# Patient Record
Sex: Female | Born: 1995 | Race: White | Hispanic: No | Marital: Married | Smoking: Never smoker
Health system: Southern US, Community
[De-identification: ages and names within clinical notes are randomized; demographics above are authoritative.]

## PROBLEM LIST (undated history)

## (undated) ENCOUNTER — Inpatient Hospital Stay (HOSPITAL_COMMUNITY): Payer: Self-pay

## (undated) DIAGNOSIS — N390 Urinary tract infection, site not specified: Secondary | ICD-10-CM

## (undated) DIAGNOSIS — G43909 Migraine, unspecified, not intractable, without status migrainosus: Secondary | ICD-10-CM

## (undated) DIAGNOSIS — F419 Anxiety disorder, unspecified: Secondary | ICD-10-CM

## (undated) DIAGNOSIS — O24419 Gestational diabetes mellitus in pregnancy, unspecified control: Secondary | ICD-10-CM

## (undated) DIAGNOSIS — Z87442 Personal history of urinary calculi: Secondary | ICD-10-CM

## (undated) DIAGNOSIS — N809 Endometriosis, unspecified: Secondary | ICD-10-CM

## (undated) DIAGNOSIS — F32A Depression, unspecified: Secondary | ICD-10-CM

## (undated) DIAGNOSIS — A281 Cat-scratch disease: Secondary | ICD-10-CM

## (undated) DIAGNOSIS — F329 Major depressive disorder, single episode, unspecified: Secondary | ICD-10-CM

## (undated) DIAGNOSIS — N83209 Unspecified ovarian cyst, unspecified side: Secondary | ICD-10-CM

## (undated) DIAGNOSIS — T7840XA Allergy, unspecified, initial encounter: Secondary | ICD-10-CM

## (undated) DIAGNOSIS — R011 Cardiac murmur, unspecified: Secondary | ICD-10-CM

## (undated) DIAGNOSIS — R319 Hematuria, unspecified: Secondary | ICD-10-CM

## (undated) DIAGNOSIS — R768 Other specified abnormal immunological findings in serum: Secondary | ICD-10-CM

## (undated) DIAGNOSIS — K219 Gastro-esophageal reflux disease without esophagitis: Secondary | ICD-10-CM

## (undated) HISTORY — DX: Other specified abnormal immunological findings in serum: R76.8

## (undated) HISTORY — PX: WISDOM TOOTH EXTRACTION: SHX21

## (undated) HISTORY — DX: Unspecified ovarian cyst, unspecified side: N83.209

## (undated) HISTORY — DX: Endometriosis, unspecified: N80.9

## (undated) HISTORY — DX: Hematuria, unspecified: R31.9

## (undated) HISTORY — DX: Allergy, unspecified, initial encounter: T78.40XA

## (undated) HISTORY — DX: Gestational diabetes mellitus in pregnancy, unspecified control: O24.419

## (undated) HISTORY — PX: OTHER SURGICAL HISTORY: SHX169

---
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
## 2014-01-04 ENCOUNTER — Encounter: Payer: Self-pay | Admitting: Gastroenterology

## 2014-01-04 ENCOUNTER — Ambulatory Visit (INDEPENDENT_AMBULATORY_CARE_PROVIDER_SITE_OTHER): Payer: Medicaid Other | Admitting: Gastroenterology

## 2014-01-04 VITALS — BP 106/74 | HR 84 | Temp 97.1°F | Ht 59.0 in | Wt 182.0 lb

## 2014-01-04 DIAGNOSIS — R109 Unspecified abdominal pain: Secondary | ICD-10-CM

## 2014-01-04 NOTE — Progress Notes (Signed)
Referring Provider: Tylene FantasiaMuse, Rochelle D., PA-C Primary GI: Dr. Darrick PennaFields   Chief Complaint  Patient presents with  . Chronic Abdominal Pain    HPI:   Returns today in follow-up for chronic abdominal pain, with extensive work-up to include CT, pelvic ultrasounds, labs, and most recently EGD with gastritis but negative H.pylori. US of abdomen without gallstones. HIDA with mildly reduced EF at 23%. Referred to Dr. Lovell SheehanJenkins for consideration of elective cholecystectomy. Still trying to decide if this is what she wants to do. Saw a urologist today due to urinary incontinence. Ordered MRI of spine. Notes periumbilical discomfort, "all the time". Worsened with food intake. Soda, water, causes more discomfort. Found out she was Vit D deficient. Now taking topamax. Feels like she is having worsening of "tingling" in her legs with this. Sees neurologist. Protonix works really well. Up 5 lbs from July 2015.   [facility]. Prescribed a medication that made her suicidal and stopped. [facility]    Past Medical History  Diagnosis Date  . Cat scratch fever   . Heart murmur of newborn   . UTI (lower urinary tract infection)   . Kidney stones   . Helicobacter pylori ab+     H.pylori serology positive in Jan 2015, treated with Amoxicillin and Flagyl at that time. Returned to clinic Feb 2015 and had rechecked, with a different provider treating with Amoxicillin and Biaxin. H.pylori stool antigen ordered April 2015 and negative.  . Ovarian cyst   . Other and unspecified ovarian cyst 08/09/2013  . Hematuria 08/09/2013  . Migraines     Past Surgical History  Procedure Laterality Date  . None    . Esophagogastroduodenoscopy N/A 08/13/2013    Dr. Darrick PennaFields: nodular gastritis, negative H.pylori    Current Outpatient Prescriptions  Medication Sig Dispense Refill  . pantoprazole (PROTONIX) 40 MG tablet Take 1 tablet (40 mg total) by mouth daily. Take 30  minutes before breakfast 30 tablet 3  . topiramate (TOPAMAX) 25 MG tablet Take 25 mg by mouth 2 (two) times daily.    . Vitamin D, Ergocalciferol, (DRISDOL) 50000 UNITS CAPS capsule Take 50,000 Units by mouth every 7 (seven) days.     No current facility-administered medications for this visit.    Allergies as of 01/04/2014  . (No Known Allergies)    Family History  Problem Relation Age of Onset  . Colon cancer      unknown  . Gallbladder disease Mother     gallbladder was removed  . Migraines Mother   . Migraines Sister   . Hypertension Sister   . ADD / ADHD Brother   . Hypertension Maternal Grandmother   . Cancer Maternal Grandfather     lung  . Cancer Paternal Grandfather     lung  . Seizures Sister   . Scoliosis Sister   . ADD / ADHD Brother     History   Social History  . Marital Status: Single    Spouse Name: N/A    Number of Children: N/A  . Years of Education: N/A   Social History Main Topics  . Smoking status: Never Smoker   . Smokeless tobacco: Never Used  . Alcohol Use: No  . Drug Use: No  . Sexual Activity: Not Currently    Birth Control/ Protection: None   Other Topics Concern  . None   Social History Narrative    Review of Systems: As mentioned in HPI  Physical Exam: BP 106/74 mmHg  Pulse 84  Temp(Src) 97.1 F (36.2 C) (Oral)  Ht 4\' 11"  (1.499 m)  Wt 182 lb (82.555 kg)  BMI 36.74 kg/m2 General:   Alert and oriented. No distress noted. Pleasant and cooperative.  Head:  Normocephalic and atraumatic. Eyes:  Conjuctiva clear without scleral icterus. Mouth:  Oral mucosa pink and moist. Good dentition. No lesions. Abdomen:  +BS, soft, TTP diffusely and non-distended. No rebound or guarding. No HSM or masses noted. Msk:  Symmetrical without gross deformities. Normal posture. Extremities:  Without edema. Neurologic:  Alert and  oriented x4;  grossly normal neurologically. Skin:  Intact without significant lesions or rashes. Psych:  Alert  and cooperative. Normal mood and affect.

## 2014-01-04 NOTE — Patient Instructions (Signed)
Continue taking Protonix once daily.   Continue appointments with other specialists.   We will have you return in 6 months.

## 2014-01-10 DIAGNOSIS — R109 Unspecified abdominal pain: Secondary | ICD-10-CM | POA: Insufficient documentation

## 2014-01-10 NOTE — Assessment & Plan Note (Signed)
18 year old female with chronic abdominal pain with exhaustive evaluation as noted in HPI; holding on elective cholecystectomy currently but has seen Dr. Lovell SheehanJenkins. Now with several non-GI issues as noted above; recommend continued Protonix daily, [facility]. No concerning signs, and she continues to gain weight, up 5 lbs since July 2015. Return in 6 months.

## 2014-01-13 ENCOUNTER — Other Ambulatory Visit: Payer: Self-pay

## 2014-01-13 NOTE — Progress Notes (Signed)
cc'ed to pcp °

## 2014-01-14 MED ORDER — PANTOPRAZOLE SODIUM 40 MG PO TBEC: 40.0000 mg | DELAYED_RELEASE_TABLET | Freq: Every day | ORAL | Status: DC

## 2014-04-07 DIAGNOSIS — Z01419 Encounter for gynecological examination (general) (routine) without abnormal findings: Secondary | ICD-10-CM | POA: Insufficient documentation

## 2014-05-05 DIAGNOSIS — E559 Vitamin D deficiency, unspecified: Secondary | ICD-10-CM | POA: Insufficient documentation

## 2014-06-22 ENCOUNTER — Encounter: Payer: Self-pay | Admitting: Gastroenterology

## 2014-07-22 ENCOUNTER — Ambulatory Visit (INDEPENDENT_AMBULATORY_CARE_PROVIDER_SITE_OTHER): Payer: Medicaid Other | Admitting: Gastroenterology

## 2014-07-22 ENCOUNTER — Encounter: Payer: Self-pay | Admitting: Gastroenterology

## 2014-07-22 VITALS — BP 109/78 | HR 77 | Temp 98.1°F | Ht 59.0 in | Wt 175.2 lb

## 2014-07-22 DIAGNOSIS — R109 Unspecified abdominal pain: Secondary | ICD-10-CM

## 2014-07-22 MED ORDER — HYOSCYAMINE SULFATE 0.125 MG SL SUBL: 0.1250 mg | SUBLINGUAL_TABLET | SUBLINGUAL | Status: DC | PRN

## 2014-07-22 NOTE — Patient Instructions (Signed)
I sent Levsin to your pharmacy to take every 4 hours only AS NEEDED for loose stool and belly cramping.   I would like to see you back in August before you start the fall semester.  Good luck with school!

## 2014-07-22 NOTE — Progress Notes (Signed)
Referring Provider: Rebecka ApleyHemberg, Katherine V, RN Primary Care Physician:  Rebecka ApleyHEMBERG, KATHERINE V, RN  Primary GI: Dr. Darrick PennaFields   Chief Complaint  Patient presents with  . Abdominal Pain    no better    HPI:   Courtney FleetCassandra M Spencer is a 19 y.o. female presenting today with a history of chronic abdominal pain, with extensive work-up to include CT, pelvic ultrasounds, labs, and most recently EGD with gastritis but negative H.pylori. US of abdomen without gallstones. HIDA with mildly reduced EF at 23%. Referred to Dr. Lovell SheehanJenkins for consideration of elective cholecystectomy. Still trying to decide if this is what she wants to do.   Just goes as needed to counselor. Things are better. Down a few lbs since last seen. Doing protein shakes, exercising more. Had 3 stools this morning before coming here. Sometimes loose, sometimes normal. Tends to be slightly more on the diarrhea side, about 50-55% of the time. Cut out dark sodas. Limiting greasy foods. No probiotic.   Past Medical History  Diagnosis Date  . Cat scratch fever   . Heart murmur of newborn   . UTI (lower urinary tract infection)   . Kidney stones   . Helicobacter pylori ab+     H.pylori serology positive in Jan 2015, treated with Amoxicillin and Flagyl at that time. Returned to clinic Feb 2015 and had rechecked, with a different provider treating with Amoxicillin and Biaxin. H.pylori stool antigen ordered April 2015 and negative.  . Ovarian cyst   . Other and unspecified ovarian cyst 08/09/2013  . Hematuria 08/09/2013  . Migraines     Past Surgical History  Procedure Laterality Date  . None    . Esophagogastroduodenoscopy N/A 08/13/2013    Dr. Darrick PennaFields: nodular gastritis, negative H.pylori    Current Outpatient Prescriptions  Medication Sig Dispense Refill  . pantoprazole (PROTONIX) 40 MG tablet Take 1 tablet (40 mg total) by mouth daily. Take 30 minutes before breakfast 30 tablet 11  . topiramate (TOPAMAX) 25 MG tablet Take 25 mg by  mouth 2 (two) times daily.    . Vitamin D, Ergocalciferol, (DRISDOL) 50000 UNITS CAPS capsule Take 50,000 Units by mouth every 7 (seven) days.     No current facility-administered medications for this visit.    Allergies as of 07/22/2014  . (No Known Allergies)    Family History  Problem Relation Age of Onset  . Colon cancer      unknown  . Gallbladder disease Mother     gallbladder was removed  . Migraines Mother   . Migraines Sister   . Hypertension Sister   . ADD / ADHD Brother   . Hypertension Maternal Grandmother   . Cancer Maternal Grandfather     lung  . Cancer Paternal Grandfather     lung  . Seizures Sister   . Scoliosis Sister   . ADD / ADHD Brother     History   Social History  . Marital Status: Single    Spouse Name: N/A  . Number of Children: N/A  . Years of Education: N/A   Occupational History  . Automotive engineerstudent     [employer]   Social History Main Topics  . Smoking status: Never Smoker   . Smokeless tobacco: Never Used     Comment: Never really smoked  . Alcohol Use: No  . Drug Use: No  . Sexual Activity: Not Currently    Birth Control/ Protection: None   Other Topics Concern  . None  Social History Narrative    Review of Systems: As mentioned in HPI  Physical Exam: BP 109/78 mmHg  Pulse 77  Temp(Src) 98.1 F (36.7 C) (Oral)  Ht  (1.499 m)  Wt 175 lb 3.2 oz (79.47 kg)  BMI 35.37 kg/m2  LMP 06/10/2014 (Approximate) General:   Alert and oriented. No distress noted. Pleasant and cooperative.  Head:  Normocephalic and atraumatic. Eyes:  Conjuctiva clear without scleral icterus. Mouth:  Oral mucosa pink and moist. Good dentition. No lesions. Abdomen:  +BS, soft, non-tender and non-distended. No rebound or guarding. No HSM or masses noted. Msk:  Symmetrical without gross deformities. Normal posture. Extremities:  Without edema. Neurologic:  Alert and  oriented x4;  grossly normal neurologically. Psych:  Alert and  cooperative. Normal mood and affect.

## 2014-08-01 NOTE — Assessment & Plan Note (Signed)
19 year old female with chronic abdmoinal pain with extensive work-up, at baseline. Improvement in overall symptoms since last seen and doing better from a psychosocial standpoint. Notes intermittent loose stool, about 50-55% of the time. Not a candidate for Viberzi due to pattern of stools but will trial Levsin to just take as needed. Return in August for further assessment. Encouraged patient to continue weight loss efforts as she has been doing.

## 2014-08-08 NOTE — Progress Notes (Signed)
cc'ed to pcp °

## 2014-09-26 ENCOUNTER — Encounter: Payer: Self-pay | Admitting: Gastroenterology

## 2014-09-26 ENCOUNTER — Ambulatory Visit (INDEPENDENT_AMBULATORY_CARE_PROVIDER_SITE_OTHER): Payer: Medicaid Other | Admitting: Gastroenterology

## 2014-09-26 VITALS — BP 108/76 | HR 79 | Temp 98.6°F | Ht 59.0 in | Wt 184.4 lb

## 2014-09-26 DIAGNOSIS — R109 Unspecified abdominal pain: Secondary | ICD-10-CM | POA: Diagnosis not present

## 2014-09-26 NOTE — Patient Instructions (Addendum)
Decrease the Levsin by cutting in 1/2 and take only as needed.  Drink at least 8 full glasses of water a day. Follow a high fiber diet that I've attached.  Good luck with school! I will see you in 6 weeks. High-Fiber Diet Fiber is found in fruits, vegetables, and grains. A high-fiber diet encourages the addition of more whole grains, legumes, fruits, and vegetables in your diet. The recommended amount of fiber for adult males is 38 g per day. For adult females, it is 25 g per day. Pregnant and lactating women should get 28 g of fiber per day. If you have a digestive or bowel problem, ask your caregiver for advice before adding high-fiber foods to your diet. Eat a variety of high-fiber foods instead of only a select few type of foods.  PURPOSE  To increase stool bulk.  To make bowel movements more regular to prevent constipation.  To lower cholesterol.  To prevent overeating. WHEN IS THIS DIET USED?  It may be used if you have constipation and hemorrhoids.  It may be used if you have uncomplicated diverticulosis (intestine condition) and irritable bowel syndrome.  It may be used if you need help with weight management.  It may be used if you want to add it to your diet as a protective measure against atherosclerosis, diabetes, and cancer. SOURCES OF FIBER  Whole-grain breads and cereals.  Fruits, such as apples, oranges, bananas, berries, prunes, and pears.  Vegetables, such as green peas, carrots, sweet potatoes, beets, broccoli, cabbage, spinach, and artichokes.  Legumes, such split peas, soy, lentils.  Almonds. FIBER CONTENT IN FOODS Starches and Grains / Dietary Fiber (g)  Cheerios, 1 cup / 3 g  Corn Flakes cereal, 1 cup / 0.7 g  Rice crispy treat cereal, 1 cup / 0.3 g  Instant oatmeal (cooked),  cup / 2 g  Frosted wheat cereal, 1 cup / 5.1 g  Brown, long-grain rice (cooked), 1 cup / 3.5 g  White, long-grain rice (cooked), 1 cup / 0.6 g  Enriched macaroni  (cooked), 1 cup / 2.5 g Legumes / Dietary Fiber (g)  Baked beans (canned, plain, or vegetarian),  cup / 5.2 g  Kidney beans (canned),  cup / 6.8 g  Pinto beans (cooked),  cup / 5.5 g Breads and Crackers / Dietary Fiber (g)  Plain or honey graham crackers, 2 squares / 0.7 g  Saltine crackers, 3 squares / 0.3 g  Plain, salted pretzels, 10 pieces / 1.8 g  Whole-wheat bread, 1 slice / 1.9 g  White bread, 1 slice / 0.7 g  Raisin bread, 1 slice / 1.2 g  Plain bagel, 3 oz / 2 g  Flour tortilla, 1 oz / 0.9 g  Corn tortilla, 1 small / 1.5 g  Hamburger or hotdog bun, 1 small / 0.9 g Fruits / Dietary Fiber (g)  Apple with skin, 1 medium / 4.4 g  Sweetened applesauce,  cup / 1.5 g  Banana,  medium / 1.5 g  Grapes, 10 grapes / 0.4 g  Orange, 1 small / 2.3 g  Raisin, 1.5 oz / 1.6 g  Melon, 1 cup / 1.4 g Vegetables / Dietary Fiber (g)  Green beans (canned),  cup / 1.3 g  Carrots (cooked),  cup / 2.3 g  Broccoli (cooked),  cup / 2.8 g  Peas (cooked),  cup / 4.4 g  Mashed potatoes,  cup / 1.6 g  Lettuce, 1 cup / 0.5 g  Corn (canned),  cup / 1.6 g  Tomato,  cup / 1.1 g Document Released: 02/11/2005 Document Revised: 08/13/2011 Document Reviewed: 05/16/2011 [facility] Patient Information 2015 [place], [place]. This information is not intended to replace advice given to you by your health care provider. Make sure you discuss any questions you have with your health care provider.

## 2014-09-26 NOTE — Progress Notes (Signed)
Referring Provider: Rebecka Apley, RN Primary Care Physician:  Rebecka Apley, RN  Primary GI: Dr. Darrick Penna   Chief Complaint  Patient presents with  . Follow-up    HPI:   Courtney Spencer is a 19 y.o. female presenting today with a history of chronic abdominal pain, diarrhea, with extensive work-up to include CT, pelvic ultrasounds, labs, and  EGD with gastritis but negative H.pylori. [place] of abdomen without gallstones. HIDA with mildly reduced EF at 23%. Here for close follow-up.    Will take Levsin with some improvement of abdominal discomfort but then ends up slightly constipated a few days later. Lately going to the bathroom and has little balls. Drinking about 2 cups of water per day. Has done fiber in the past, which causes diarrhea. Has cut down on spicy foods. Sometimes goes four times in one hour in the morning. Has urgency to go to bathroom but not diarrhea.    Past Medical History  Diagnosis Date  . Cat scratch fever   . Heart murmur of newborn   . UTI (lower urinary tract infection)   . Kidney stones   . Helicobacter pylori ab+     H.pylori serology positive in Jan 2015, treated with Amoxicillin and Flagyl at that time. Returned to clinic Feb 2015 and had rechecked, with a different provider treating with Amoxicillin and Biaxin. H.pylori stool antigen ordered April 2015 and negative.  . Ovarian cyst   . Other and unspecified ovarian cyst 08/09/2013  . Hematuria 08/09/2013  . Migraines     Past Surgical History  Procedure Laterality Date  . None    . Esophagogastroduodenoscopy N/A 08/13/2013    Dr. Darrick Penna: nodular gastritis, negative H.pylori    Current Outpatient Prescriptions  Medication Sig Dispense Refill  . hyoscyamine (LEVSIN SL) 0.125 MG SL tablet Place 1 tablet (0.125 mg total) under the tongue every 4 (four) hours as needed. 60 tablet 3  . pantoprazole (PROTONIX) 40 MG tablet Take 1 tablet (40 mg total) by mouth daily. Take 30 minutes before  breakfast 30 tablet 11  . propranolol (INDERAL) 60 MG tablet Take 60 mg by mouth 3 (three) times daily.    . Vitamin D, Ergocalciferol, (DRISDOL) 50000 UNITS CAPS capsule Take 50,000 Units by mouth every 7 (seven) days.     No current facility-administered medications for this visit.    Allergies as of 09/26/2014  . (No Known Allergies)    Family History  Problem Relation Age of Onset  . Colon cancer      unknown  . Gallbladder disease Mother     gallbladder was removed  . Migraines Mother   . Migraines Sister   . Hypertension Sister   . ADD / ADHD Brother   . Hypertension Maternal Grandmother   . Cancer Maternal Grandfather     lung  . Cancer Paternal Grandfather     lung  . Seizures Sister   . Scoliosis Sister   . ADD / ADHD Brother     History   Social History  . Marital Status: Single    Spouse Name: N/A  . Number of Children: N/A  . Years of Education: N/A   Occupational History  . 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 Smoking status: Never Smoker   . Smokeless tobacco: Never Used     Comment: Never really  smoked  . Alcohol Use: No  . Drug Use: No  . Sexual Activity: Not Currently    Birth Control/ Protection: None   Other Topics Concern  . None   Social History Narrative    Review of Systems: As mentioned in HPI  Physical Exam: BP 108/76 mmHg  Pulse 79  Temp(Src) 98.6 F (37 C)  Ht 4\' 11"  (1.499 m)  Wt 184 lb 6.4 oz (83.643 kg)  BMI 37.22 kg/m2  LMP 09/05/2014 (Approximate) General:   Alert and oriented. No distress noted. Pleasant and cooperative.  Head:  Normocephalic and atraumatic. Eyes:  Conjuctiva clear without scleral icterus. Abdomen:  +BS, soft, non-tender and non-distended. No rebound or guarding. No HSM or masses noted. Msk:  Symmetrical without gross deformities. Normal posture. Extremities:  Without edema. Neurologic:  Alert and   oriented x4;  grossly normal neurologically. Psych:  Alert and cooperative. Normal mood and affect.

## 2014-09-28 NOTE — Assessment & Plan Note (Signed)
Improved on Levsin but now with tendency towards constipation. Unable to tolerate supplemental fiber. Likely diet/behavior playing a role in overall symptoms. Thorough work-up in the past for abdominal pain. Try novel approach of 1/2 dose Levsin as needed, increase dietary fiber through food choices, increase water intake, and return in 6 weeks.

## 2014-09-29 NOTE — Progress Notes (Signed)
cc'ed to pcp °

## 2014-10-24 ENCOUNTER — Encounter: Payer: Self-pay | Admitting: Women's Health

## 2014-10-24 ENCOUNTER — Ambulatory Visit (INDEPENDENT_AMBULATORY_CARE_PROVIDER_SITE_OTHER): Payer: Medicaid Other | Admitting: Women's Health

## 2014-10-24 VITALS — BP 106/62 | HR 76 | Wt 182.0 lb

## 2014-10-24 DIAGNOSIS — IMO0002 Reserved for concepts with insufficient information to code with codable children: Secondary | ICD-10-CM | POA: Insufficient documentation

## 2014-10-24 DIAGNOSIS — N941 Dyspareunia: Secondary | ICD-10-CM | POA: Diagnosis not present

## 2014-10-24 DIAGNOSIS — R102 Pelvic and perineal pain: Secondary | ICD-10-CM | POA: Diagnosis not present

## 2014-10-24 DIAGNOSIS — N949 Unspecified condition associated with female genital organs and menstrual cycle: Secondary | ICD-10-CM

## 2014-10-24 NOTE — Patient Instructions (Addendum)
Call the office to get pelvic ultrasound scheduled for when you will be going off of your period, and follow up visit with me after ultrasound

## 2014-10-24 NOTE — Progress Notes (Signed)
Patient ID: Courtney Spencer, female   DOB: 1995/05/10, 19 y.o.   MRN: [id number]  Chief Complaint  Patient presents with  . Abdominal Pain    states has cyst on ovaries that are hurting, pain with sex    Blood pressure 106/62, pulse 76, weight 182 lb (82.555 kg), last menstrual period 10/03/2014.  19 y.o. G0P0 Patient's last menstrual period was 10/03/2014. The current method of family planning is condoms.  Subjective Intermittent bilateral lower pelvic pain 'for awhile', also dyspareunia for about 1 month. Has h/o ovarian cysts, feels like the same kind of pain. Will take Levsin SL which sometimes helps w/ her pain. Reports that she takes Levsin for gallbladder that needs to come out but she's not sure if she wants it out yet. Denies abnormal d/c, itching/odor/irritation. Regular periods, regular flow. Condoms for contraception- doesn't like birth control. Depo made her gain 60+lbs, COCs made her stomach hurt.   Objective Vulva:  normal appearing vulva with no masses, tenderness or lesions Vagina:  normal mucosa, no discharge Cervix:  cervical motion tenderness and no lesions Uterus:  normal size, contour, position, consistency, mobility, non-tender Adnexa: ovaries:present,  tenderness right, no masses    Pertinent ROS No burning with urination, frequency or urgency No nausea, vomiting or diarrhea Nor fever chills or other constitutional symptoms  Labs or studies 08/30/13 Pelvic U/S:  Courtney Spencer is a 19 y.o. G0P0 LMP 07/29/2013 for a pelvic sonogram for follow up Rt ovarian cyst noted on previous u/s (3.2 x 2.8 x 3.2cm). Uterus 6.5 x 3.3 x 4.4 cm, anteverted uterus Endometrium 9.5 mm, symmetrical,  Right ovary 3.5 x 2.7 x 1.8 cm, no cyst or mass noted on today's exam, +Doppler flow noted within  Left ovary 2.4 x 1.8 x 1.6 cm, +Doppler flow noted within  No free fluid or adnexal masse noted within the pelvis Technician  Comments: Anteverted uterus, Endom symmetrical 9.35mm, bilateral adnexa/ovaries appear WNL, No free fluid or adnexal masses noted within the pelvis Chari Manning 08/30/2013 10:06 AM   Impression Diagnoses this Encounter::   ICD-9-CM ICD-10-CM   1. Dyspareunia 625.0 N94.1 GC/Chlamydia Probe Amp  2. Cervical motion tenderness 625.8 N94.9 GC/Chlamydia Probe Amp  3. Pelvic pain in female 625.9 R10.2 US Pelvis Complete     US Transvaginal Non-OB     GC/Chlamydia Probe Amp    Established relevant diagnosis(es): Chronic abd pain per GI w/ extensive work-up including CT, labs, EGD w/ gastritis but neg H pylori, [place] abd w/o gallstones, HIDA w/ mildly reduced EF @ 23%  Plan/Recommendations Discussed [facility] to suppress ovulation to help prevent ovarian cysts- pt declines, doesn't like contraception Will do pelvic u/s when she comes off of next period to evaluate ovaries Will get gc/ct d/t CMT  Orders Placed This Encounter  Procedures  . GC/Chlamydia Probe Amp  . US Pelvis Complete  . US Transvaginal Non-OB    Follow up Return for pt will call for appt. when comes off period, will need pelvic u/s and f/u w/ me   Cheral Marker, CNM, [facility] 10/24/2014 3:33 PM

## 2014-10-29 LAB — GC/CHLAMYDIA PROBE AMP
Chlamydia trachomatis, NAA: NEGATIVE
Neisseria gonorrhoeae by PCR: NEGATIVE

## 2014-11-15 ENCOUNTER — Ambulatory Visit: Payer: Medicaid Other | Admitting: Gastroenterology

## 2014-11-18 ENCOUNTER — Ambulatory Visit (INDEPENDENT_AMBULATORY_CARE_PROVIDER_SITE_OTHER): Payer: Medicaid Other

## 2014-11-18 DIAGNOSIS — R102 Pelvic and perineal pain: Secondary | ICD-10-CM | POA: Diagnosis not present

## 2014-11-18 NOTE — Progress Notes (Signed)
PELVIC TA/TV:normal anteverted uterus,EEC 6.30mm,normal ov's bilat (mobile),bilat adnexal pain during ultrasound,no free fluid seen

## 2014-11-23 ENCOUNTER — Ambulatory Visit: Payer: Medicaid Other | Admitting: Women's Health

## 2014-11-30 ENCOUNTER — Encounter: Payer: Self-pay | Admitting: Women's Health

## 2014-11-30 ENCOUNTER — Ambulatory Visit (INDEPENDENT_AMBULATORY_CARE_PROVIDER_SITE_OTHER): Payer: Medicaid Other | Admitting: Women's Health

## 2014-11-30 VITALS — BP 102/62 | HR 80 | Wt 187.0 lb

## 2014-11-30 DIAGNOSIS — R102 Pelvic and perineal pain: Secondary | ICD-10-CM

## 2014-11-30 NOTE — Progress Notes (Signed)
Patient ID: Courtney Spencer, female   DOB: [birth year], 19 y.o.   MRN: [id number]   [facility] Clinic Visit  Patient name: Courtney Spencer MRN [id number]  Date of birth: September 08, 1995  CC & HPI:  Courtney Spencer is a 19 y.o. G0P0 Caucasian female presenting today for f/u after pelvic u/s for intermittent bilateral  pelvic pain 'for awhile' w/ h/o ovarian cysts.  Reports she had pain when ultrasonographer was looking at ovaries. GC/CT from last visit was neg. Reports that Levsin she takes for gallbladder that needs to come out helps with pain. Has appt tomorrow w/ GI.  Patient's last menstrual period was 11/07/2014 (approximate).   Pertinent History Reviewed:  Medical & Surgical Hx:   Past Medical History  Diagnosis Date  . Cat scratch fever   . Heart murmur of newborn   . UTI (lower urinary tract infection)   . Kidney stones   . Helicobacter pylori ab+     H.pylori serology positive in Jan 2015, treated with Amoxicillin and Flagyl at that time. Returned to clinic Feb 2015 and had rechecked, with a different provider treating with Amoxicillin and Biaxin. H.pylori stool antigen ordered April 2015 and negative.  . Ovarian cyst   . Other and unspecified ovarian cyst 08/09/2013  . Hematuria 08/09/2013  . Migraines    Past Surgical History  Procedure Laterality Date  . None    . Esophagogastroduodenoscopy N/A 08/13/2013    Dr. Darrick Penna: nodular gastritis, negative H.pylori   Medications: Reviewed & Updated - see associated section Social History: Reviewed -  reports that she has never smoked. She has never used smokeless tobacco.  Objective Findings:  Vitals: BP 102/62 mmHg  Pulse 80  Wt 187 lb (84.823 kg)  LMP 11/07/2014 (Approximate) Body mass index is 37.75 kg/(m^2).  Physical Examination: General appearance - alert, well appearing, and in no distress  Pelvic u/s 11/18/14:  Courtney Spencer is a 19 y.o. G0P0 LMP 10/11/2014 for a pelvic sonogram for pelvic  pain. Uterus 7.7 x 4.2 x 2.9 cm, normal anteverted uterus Endometrium 6.2 mm, symmetrical, wnl Right ovary 2.8 x 2.1 x 1.2 cm, wnl Left ovary 2.9 x 2.1 x 1.7 cm, wnl  Technician Comments: PELVIC TA/TV:normal anteverted uterus,EEC 6.26mm,normal ov's bilat (mobile),bilat adnexal pain during ultrasound,no free fluid seen [employer] 11/18/2014 12:39 PM   Assessment & Plan:  A:   Pelvic pain w/o clear gyn etiology  P:  F/U w/ GI tomorrow as scheduled, inquire if there could be a GI component to pain as GYN u/s clear  Return for when turns 20yo for pap .  Marge Duncans CNM, [facility] 11/30/2014 2:37 PM

## 2014-12-01 ENCOUNTER — Encounter: Payer: Self-pay | Admitting: Gastroenterology

## 2014-12-01 ENCOUNTER — Telehealth: Payer: Self-pay | Admitting: Gastroenterology

## 2014-12-01 ENCOUNTER — Ambulatory Visit: Payer: Medicaid Other | Admitting: Gastroenterology

## 2014-12-01 NOTE — Telephone Encounter (Signed)
PATIENT WAS A NO SHOW AND LETTER SENT  °

## 2014-12-21 ENCOUNTER — Encounter: Payer: Self-pay | Admitting: Gastroenterology

## 2014-12-21 ENCOUNTER — Telehealth: Payer: Self-pay | Admitting: Gastroenterology

## 2014-12-21 ENCOUNTER — Ambulatory Visit: Payer: Medicaid Other | Admitting: Gastroenterology

## 2014-12-21 NOTE — Telephone Encounter (Signed)
PATIENT WAS A NO SHOW AND LETTER SENT  °

## 2015-02-09 DIAGNOSIS — K580 Irritable bowel syndrome with diarrhea: Secondary | ICD-10-CM | POA: Insufficient documentation

## 2015-03-19 ENCOUNTER — Other Ambulatory Visit: Payer: Self-pay | Admitting: Gastroenterology

## 2015-04-08 NOTE — Progress Notes (Signed)
REVIEWED-NO ADDITIONAL RECOMMENDATIONS. 

## 2015-04-18 ENCOUNTER — Other Ambulatory Visit: Payer: Self-pay

## 2015-04-18 ENCOUNTER — Ambulatory Visit (INDEPENDENT_AMBULATORY_CARE_PROVIDER_SITE_OTHER): Payer: Medicaid Other | Admitting: Nurse Practitioner

## 2015-04-18 ENCOUNTER — Encounter: Payer: Self-pay | Admitting: Nurse Practitioner

## 2015-04-18 VITALS — BP 107/72 | HR 70 | Temp 97.6°F | Ht 59.0 in | Wt 178.8 lb

## 2015-04-18 DIAGNOSIS — R197 Diarrhea, unspecified: Secondary | ICD-10-CM | POA: Diagnosis not present

## 2015-04-18 DIAGNOSIS — R109 Unspecified abdominal pain: Secondary | ICD-10-CM | POA: Diagnosis not present

## 2015-04-18 NOTE — Assessment & Plan Note (Addendum)
Patient with somewhat improved yet persistent abdominal pain. This is described as primarily lower abdomen and periumbilical. However, on exam she has mild tenderness to palpation of her periumbilical and right upper quadrant areas as well as minimally to her epigastric region. Abdominal pain with no significant relief after bowel movement. Also with diarrhea as noted below. History of decreased gallbladder function on HIDA scan has previously seen surgery and at this point she would like to go back to surgery for discussion of possible cholecystectomy. Recommend she continue her Levsin, return for follow-up here in 4 months or sooner if worsening symptoms.

## 2015-04-18 NOTE — Assessment & Plan Note (Addendum)
The majority of the patient's bowel movements are diarrhea, occasionally formed. 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 We will send her back to surgery to discuss possible cholecystectomy continue Levsin, return for follow-up in 4 months.

## 2015-04-18 NOTE — Patient Instructions (Signed)
1. We will refer you back to surgery to discuss possibly having her gallbladder out. 2. Continue taking Levsin as you have been taking. 3. Return for follow-up here in 4 months. 4. Call us if your symptoms become severe and we can see you sooner.

## 2015-04-18 NOTE — Progress Notes (Signed)
CC'ED TO PCP 

## 2015-04-18 NOTE — Progress Notes (Signed)
Referring Provider: [facility] * Primary Care Physician:  Rebecka Apley, RN Primary GI:  Dr. Darrick Penna  Chief Complaint  Patient presents with  . Follow-up    Doing better    HPI:   Courtney Spencer is a 20 y.o. female who presents for follow-up on abdominal pain. Last seen in our office 09/26/2014 at which point she noted improvement on Levsin but tendency towards constipation and unable to tolerate supplemental fiber. Deemed likely diet/behavior playing a role in her symptoms. Thorough workup for abdominal pain previously. At that time she was given novel approach of half dose Levsin as needed, increase dietary fiber, increase water intake, return for follow-up in 6 weeks. She was subsequently a no-show for her next 2 scheduled office visits. Previous endoscopy dated 08/03/2013 which found normal esophageal mucosa, dyspepsia possibly due to moderate nodular gastritis. Recommended continue Protonix, soft mechanical diet with meats chopped or ground. Pathology of duodenum biopsy found benign mobile mucosa, stomach biopsy found ulcerated gastric antral type mucosa with associated mild chronic inflammation, esophageal biopsy slightly inflamed squamous mucosa without evidence of eosinophilic esophagitis. Esophageal biopsies slightly inflamed squamous mucosa again no evidence of eosinophilic esophagitis. None of her biopsies found intestinal metaplasia, dysplasia, or malignancy. Previous HIDA scan completed 09/16/2013 which found no evidence of biliary obstruction or cholecystitis, mildly reduced gallbladder ejection fraction of 23% and 30 minutes which may be consistent with mild underlying biliary dyskinesia.  Today she states overall symptoms are improved. Still has days she needs to take Levsin. Typically needs it 1-2 times a day. Denies N/V. Pain is lower abdominal and periumbilical. Sometimes worse after eating, lasts until she takes levsin and will "calm down" until she eats  again. Having a bowel movement daily. Typically has diarrhea, occasional formed stool. Pain not improved with bowel movement. Denies hematochezia, melena, fever, chills, unintentional weight loss. She states she was seen by surgeon and the option was left up to her on whether to have her gallbladder out. She now thinks she'd like to proceed. Denies chest pain, dyspnea, dizziness, lightheadedness, syncope, near syncope. Denies any other upper or lower GI symptoms.  Past Medical History  Diagnosis Date  . Cat scratch fever   . Heart murmur of newborn   . UTI (lower urinary tract infection)   . Kidney stones   . Helicobacter pylori ab+     H.pylori serology positive in Jan 2015, treated with Amoxicillin and Flagyl at that time. Returned to clinic Feb 2015 and had rechecked, with a different provider treating with Amoxicillin and Biaxin. H.pylori stool antigen ordered April 2015 and negative.  . Ovarian cyst   . Other and unspecified ovarian cyst 08/09/2013  . Hematuria 08/09/2013  . Migraines     Past Surgical History  Procedure Laterality Date  . None    . Esophagogastroduodenoscopy N/A 08/13/2013    Dr. Darrick Penna: nodular gastritis, negative H.pylori    Current Outpatient Prescriptions  Medication Sig Dispense Refill  . hyoscyamine (LEVSIN SL) 0.125 MG SL tablet Place 1 tablet (0.125 mg total) under the tongue every 4 (four) hours as needed. 60 tablet 3  . pantoprazole (PROTONIX) 40 MG tablet TAKE ONE TABLET BY MOUTH ONCE DAILY. TAKE 30 MINUTES BEFORE BREAKFAST 30 tablet 5  . propranolol (INDERAL) 60 MG tablet Take 60 mg by mouth 3 (three) times daily.    . Vitamin D, Ergocalciferol, (DRISDOL) 50000 UNITS CAPS capsule Take 50,000 Units by mouth every 7 (seven) days.    Marland Kitchen  phentermine (ADIPEX-P) 37.5 MG tablet Take 37.5 mg by mouth.     No current facility-administered medications for this visit.    Allergies as of 04/18/2015  . (No Known Allergies)    Family History  Problem Relation  Age of Onset  . Colon cancer      unknown  . Gallbladder disease Mother     gallbladder was removed  . Migraines Mother   . Migraines Sister   . Hypertension Sister   . ADD / ADHD Brother   . Hypertension Maternal Grandmother   . Cancer Maternal Grandfather     lung  . Cancer Paternal Grandfather     lung  . Seizures Sister   . Scoliosis Sister   . ADD / ADHD Brother     Social History   Social History  . Marital Status: Single    Spouse Name: N/A  . Number of Children: N/A  . Years of Education: N/A   Occupational History  . 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    Social History Main Topics  . Smoking status: Never Smoker   . Smokeless tobacco: Never Used     Comment: Never really smoked  . Alcohol Use: No  . Drug Use: No  . Sexual Activity: Not Currently    Birth Control/ Protection: None   Other Topics Concern  . None   Social History Narrative    Review of Systems: 10-point ROS negative except as per HPI.   Physical Exam: BP 107/72 mmHg  Pulse 70  Temp(Src) 97.6 F (36.4 C) (Oral)  Ht  (1.499 m)  Wt 178 lb 12.8 oz (81.103 kg)  BMI 36.09 kg/m2  LMP 04/11/2015 General:   Alert and oriented. Pleasant and cooperative. Well-nourished and well-developed.  Head:  Normocephalic and atraumatic. Eyes:  Without icterus, sclera clear and conjunctiva pink.  Ears:  Normal auditory acuity. Cardiovascular:  S1, S2 present without murmurs appreciated. Extremities without clubbing or edema. Respiratory:  Clear to auscultation bilaterally. No wheezes, rales, or rhonchi. No distress.  Gastrointestinal:  +BS, soft, and non-distended. Mild periumbilical and RUQ TTP noted. No HSM noted. No guarding or rebound. No masses appreciated.  Rectal:  Deferred  Neurologic:  Alert and oriented x4;  grossly normal neurologically. Psych:  Alert and cooperative. Normal mood and affect. Heme/Lymph/Immune: No  excessive bruising noted.    04/18/2015 11:20 AM

## 2015-05-05 NOTE — H&P (Signed)
  NTS SOAP Note  Vital Signs:  Vitals as of: 05/04/2015: Systolic 114: Diastolic 76: Heart Rate 77: Temp 96.74F (Temporal): Height 814ft 10in: Weight 179Lbs 0 Ounces: Pain Level 5: BMI 37.41   BMI : 37.41 kg/m2  Subjective: This 20 year old female presents for of right upper quadrant abdominal pain.  Seems to be worsening.  Made worse with fatty food intolerance.  No fever, chills, jaundice.  Had HIDA scan which showed low gallbladder ejection fraction with reproducible symptoms with cck.  Review of Symptoms:  Constitutional:unremarkable   headache Eyes:unremarkable   Nose/Mouth/Throat:unremarkable Cardiovascular:  unremarkable Respiratory:unremarkable Gastrointestinabdominal pain, nausea, heartburn, dyspepsia Genitourinary:frequency back pain Skin:unremarkable Hematolgic/Lymphatic:unremarkable   Allergic/Immunologic:unremarkable   Past Medical History:  Reviewed  Past Medical History  Surgical History: none Medical Problems: reflux Allergies: nkda Medications: pantoprazole, dicyclomine   Social History:Reviewed  Social History  Preferred Language: English Race:  White Ethnicity: Not Hispanic / Latino Age: [age] Years 2 Months Marital Status:  S Alcohol: no   Smoking Status: Never smoker reviewed on 05/04/2015 Functional Status reviewed on 05/04/2015 ------------------------------------------------ Bathing: Normal Cooking: Normal Dressing: Normal Driving: Normal Eating: Normal Managing Meds: Normal Oral Care: Normal Shopping: Normal Toileting: Normal Transferring: Normal Walking: Normal Cognitive Status reviewed on 05/04/2015 ------------------------------------------------ Attention: Normal Decision Making: Normal Language: Normal Memory: Normal Motor: Normal Perception: Normal Problem Solving: Normal Visual and Spatial: Normal   Family History:Reviewed  Family Health History Mother, Living; Healthy;  Father, Living; Healthy;  healthy    Objective Information: General:Well appearing, well nourished in no distress. Heart:RRR, no murmur or gallop.  Normal S1, S2.  No S3, S4.  Lungs:  CTA bilaterally, no wheezes, rhonchi, rales.  Breathing unlabored. Abdomen:Soft, discomfort in right upper quadrant to palpation, ND, no HSM, no masses.  Assessment:Chronic cholecystitis  Diagnoses: 575.11  K81.1 Chronic cholecystitis (Chronic cholecystitis)  Procedures: [id number] - OFFICE OUTPATIENT VISIT 15 MINUTES    Plan:  Scheduled for laparoscopic cholecystectomy on 05/12/15.   Patient Education:Alternative treatments to surgery were discussed with patient (and family).  Risks and benefits  of procedure including bleeding, infection, hepatobiliary injury, and the possibility of an open procedure were fully explained to the patient (and family) who gave informed consent. Patient/family questions were addressed.  Follow-up:Pending Surgery

## 2015-05-08 NOTE — Patient Instructions (Signed)
Courtney Spencer  05/08/2015     @   Your procedure is scheduled on  05/12/2015  Report to [facility] at  830  A.M.  Call this number if you have problems the morning of surgery:  [phone]   Remember:  Do not eat food or drink liquids after midnight.  Take these medicines the morning of surgery with A SIP OF WATER  Protonix, propranolol.   Do not wear jewelry, make-up or nail polish.  Do not wear lotions, powders, or perfumes.  You may wear deodorant.  Do not shave 48 hours prior to surgery.  Men may shave face and neck.  Do not bring valuables to the hospital.  [facility] is not responsible for any belongings or valuables.  Contacts, dentures or bridgework may not be worn into surgery.  Leave your suitcase in the car.  After surgery it may be brought to your room.  For patients admitted to the hospital, discharge time will be determined by your treatment team.  Patients discharged the day of surgery will not be allowed to drive home.   Name and phone number of your driver:   family Special instructions:  none  Please read over the following fact sheets that you were given. Coughing and Deep Breathing, MRSA Information, Surgical Site Infection Prevention and Anesthesia Post-op Instructions      Laparoscopic Cholecystectomy Laparoscopic cholecystectomy is surgery to remove the gallbladder. The gallbladder is located in the upper right part of the abdomen, behind the liver. It is a storage sac for bile, which is produced in the liver. Bile aids in the digestion and absorption of fats. Cholecystectomy is often done for inflammation of the gallbladder (cholecystitis). This condition is usually caused by a buildup of gallstones (cholelithiasis) in the gallbladder. Gallstones can block the flow of bile, and that can result in inflammation and pain. In severe cases, emergency surgery may be required. If emergency surgery is not required, you will  have time to prepare for the procedure. Laparoscopic surgery is an alternative to open surgery. Laparoscopic surgery has a shorter recovery time. Your common bile duct may also need to be examined during the procedure. If stones are found in the common bile duct, they may be removed. LET [facility] CARE PROVIDER KNOW ABOUT:  Any allergies you have.  All medicines you are taking, including vitamins, herbs, eye drops, creams, and over-the-counter medicines.  Previous problems you or members of your family have had with the use of anesthetics.  Any blood disorders you have.  Previous surgeries you have had.  Any medical conditions you have. RISKS AND COMPLICATIONS Generally, this is a safe procedure. However, problems may occur, including:  Infection.  Bleeding.  Allergic reactions to medicines.  Damage to other structures or organs.  A stone remaining in the common bile duct.  A bile leak from the cyst duct that is clipped when your gallbladder is removed.  The need to convert to open surgery, which requires a larger incision in the abdomen. This may be necessary if your surgeon thinks that it is not safe to continue with a laparoscopic procedure. BEFORE THE PROCEDURE  Ask your health care provider about:  Changing or stopping your regular medicines. This is especially important if you are taking diabetes medicines or blood thinners.  Taking medicines such as aspirin and ibuprofen. These medicines can thin your blood. Do not take these medicines before your procedure if your health  care provider instructs you not to.  Follow instructions from your health care provider about eating or drinking restrictions.  Let your health care provider know if you develop a cold or an infection before surgery.  Plan to have someone take you home after the procedure.  Ask your health care provider how your surgical site will be marked or identified.  You may be given antibiotic medicine  to help prevent infection. PROCEDURE  To reduce your risk of infection:  Your health care team will wash or sanitize their hands.  Your skin will be washed with soap.  An IV tube may be inserted into one of your veins.  You will be given a medicine to make you fall asleep (general anesthetic).  A breathing tube will be placed in your mouth.  The surgeon will make several small cuts (incisions) in your abdomen.  A thin, lighted tube (laparoscope) that has a tiny camera on the end will be inserted through one of the small incisions. The camera on the laparoscope will send a picture to a TV screen (monitor) in the operating room. This will give the surgeon a good view inside your abdomen.  A gas will be pumped into your abdomen. This will expand your abdomen to give the surgeon more room to perform the surgery.  Other tools that are needed for the procedure will be inserted through the other incisions. The gallbladder will be removed through one of the incisions.  After your gallbladder has been removed, the incisions will be closed with stitches (sutures), staples, or skin glue.  Your incisions may be covered with a bandage (dressing). The procedure may vary among health care providers and hospitals. AFTER THE PROCEDURE  Your blood pressure, heart rate, breathing rate, and blood oxygen level will be monitored often until the medicines you were given have worn off.  You will be given medicines as needed to control your pain.   This information is not intended to replace advice given to you by your health care provider. Make sure you discuss any questions you have with your health care provider.   Document Released: 02/11/2005 Document Revised: 11/02/2014 Document Reviewed: 09/23/2012 Elsevier Interactive Patient Education 2016 [employer] Laparoscopic Cholecystectomy, Care After These instructions give you information about caring for yourself after your procedure. Your doctor  may also give you more specific instructions. Call your doctor if you have any problems or questions after your procedure. HOME CARE Incision Care  Follow instructions from your doctor about how to take care of your cuts from surgery (incisions). Make sure you:  Wash your hands with soap and water before you change your bandage (dressing). If you cannot use soap and water, use hand sanitizer.  Change your bandage as told by your doctor.  Leave stitches (sutures), skin glue, or skin tape (adhesive) strips in place. They may need to stay in place for 2 weeks or longer. If tape strips get loose and curl up, you may trim the loose edges. Do not remove tape strips completely unless your doctor says it is okay.  Do not take baths, swim, or use a hot tub until your doctor says it is okay. Ask your doctor if you can take showers. You may only be allowed to take sponge baths. General Instructions  Take over-the-counter and prescription medicines only as told by your doctor.  Do not drive or use heavy machinery while taking prescription pain medicine.  Return to your normal diet as told by your doctor.  Do not lift anything that is heavier than 10 lb (4.5 kg).  Do not play contact sports for 1 week or until your doctor says it is okay. GET HELP IF:  You have redness, swelling, or pain at the site of your surgical cuts.  You have fluid, blood, or pus coming from your cuts.  You notice a bad smell coming from your cut area.  Your surgical cuts break open.  You have a fever. GET HELP RIGHT AWAY IF:   You have a rash.  You have trouble breathing.  You have chest pain.  You have pain in your shoulders (shoulder strap areas) that is getting worse.  You pass out (faint) or feel dizzy while you are standing.  You have very bad pain in your belly (abdomen).  You feel sick to your stomach (nauseous) or throw up (vomit) for more than 1 day.   This information is not intended to replace  advice given to you by your health care provider. Make sure you discuss any questions you have with your health care provider.   Document Released: 11/21/2007 Document Revised: 11/02/2014 Document Reviewed: 09/23/2012 Elsevier Interactive Patient Education 2016 Elsevier Inc. PATIENT INSTRUCTIONS POST-ANESTHESIA  IMMEDIATELY FOLLOWING SURGERY:  Do not drive or operate machinery for the first twenty four hours after surgery.  Do not make any important decisions for twenty four hours after surgery or while taking narcotic pain medications or sedatives.  If you develop intractable nausea and vomiting or a severe headache please notify your doctor immediately.  FOLLOW-UP:  Please make an appointment with your surgeon as instructed. You do not need to follow up with anesthesia unless specifically instructed to do so.  WOUND CARE INSTRUCTIONS (if applicable):  Keep a dry clean dressing on the anesthesia/puncture wound site if there is drainage.  Once the wound has quit draining you may leave it open to air.  Generally you should leave the bandage intact for twenty four hours unless there is drainage.  If the epidural site drains for more than 36-48 hours please call the anesthesia department.  QUESTIONS?:  Please feel free to call your physician or the hospital operator if you have any questions, and they will be happy to assist you.

## 2015-05-09 ENCOUNTER — Encounter (HOSPITAL_COMMUNITY): Payer: Self-pay

## 2015-05-09 ENCOUNTER — Other Ambulatory Visit: Payer: Self-pay

## 2015-05-09 ENCOUNTER — Encounter (HOSPITAL_COMMUNITY)
Admission: RE | Admit: 2015-05-09 | Discharge: 2015-05-09 | Disposition: A | Discharge status: Home / Self Care | Payer: Medicaid Other | Source: Ambulatory Visit | Attending: General Surgery | Admitting: General Surgery

## 2015-05-09 DIAGNOSIS — Z01812 Encounter for preprocedural laboratory examination: Secondary | ICD-10-CM | POA: Insufficient documentation

## 2015-05-09 DIAGNOSIS — Z01810 Encounter for preprocedural cardiovascular examination: Secondary | ICD-10-CM | POA: Diagnosis not present

## 2015-05-09 HISTORY — DX: Depression, unspecified: F32.A

## 2015-05-09 HISTORY — DX: Personal history of urinary calculi: Z87.442

## 2015-05-09 HISTORY — DX: Major depressive disorder, single episode, unspecified: F32.9

## 2015-05-09 HISTORY — DX: Gastro-esophageal reflux disease without esophagitis: K21.9

## 2015-05-09 HISTORY — DX: Anxiety disorder, unspecified: F41.9

## 2015-05-09 LAB — HEPATIC FUNCTION PANEL
ALBUMIN: 3.8 g/dL (ref 3.5–5.0)
ALK PHOS: 55 U/L (ref 38–126)
ALT: 21 U/L (ref 14–54)
AST: 16 U/L (ref 15–41)
Bilirubin, Direct: 0.1 mg/dL (ref 0.1–0.5)
Indirect Bilirubin: 0.3 mg/dL (ref 0.3–0.9)
TOTAL PROTEIN: 7.4 g/dL (ref 6.5–8.1)
Total Bilirubin: 0.4 mg/dL (ref 0.3–1.2)

## 2015-05-09 LAB — BASIC METABOLIC PANEL
ANION GAP: 7 (ref 5–15)
BUN: 13 mg/dL (ref 6–20)
CHLORIDE: 109 mmol/L (ref 101–111)
CO2: 22 mmol/L (ref 22–32)
Calcium: 8.9 mg/dL (ref 8.9–10.3)
Creatinine, Ser: 0.63 mg/dL (ref 0.44–1.00)
GFR calc Af Amer: >60 mL/min (ref >60)
GFR calc non Af Amer: >60 mL/min (ref >60)
GLUCOSE: 105 mg/dL — AB (ref 65–99)
POTASSIUM: 4 mmol/L (ref 3.5–5.1)
Sodium: 138 mmol/L (ref 135–145)

## 2015-05-09 LAB — CBC WITH DIFFERENTIAL/PLATELET
BASOS ABS: 0 10*3/uL (ref 0.0–0.1)
Basophils Relative: 0 %
EOS ABS: 0.1 10*3/uL (ref 0.0–0.7)
EOS PCT: 1 %
HCT: 36 % (ref 36.0–46.0)
HEMOGLOBIN: 12.1 g/dL (ref 12.0–15.0)
LYMPHS PCT: 25 %
Lymphs Abs: 2.4 10*3/uL (ref 0.7–4.0)
MCH: 27.2 pg (ref 26.0–34.0)
MCHC: 33.6 g/dL (ref 30.0–36.0)
MCV: 80.9 fL (ref 78.0–100.0)
Monocytes Absolute: 0.7 10*3/uL (ref 0.1–1.0)
Monocytes Relative: 7 %
NEUTROS PCT: 67 %
Neutro Abs: 6.3 10*3/uL (ref 1.7–7.7)
PLATELETS: 344 10*3/uL (ref 150–400)
RBC: 4.45 MIL/uL (ref 3.87–5.11)
RDW: 13.8 % (ref 11.5–15.5)
WBC: 9.5 10*3/uL (ref 4.0–10.5)

## 2015-05-09 LAB — HCG, SERUM, QUALITATIVE: Preg, Serum: NEGATIVE

## 2015-05-09 NOTE — Pre-Procedure Instructions (Signed)
Patient given information to sign up for my chart at home. 

## 2015-05-12 ENCOUNTER — Ambulatory Visit (HOSPITAL_COMMUNITY): Payer: Medicaid Other | Admitting: Certified Registered Nurse Anesthetist

## 2015-05-12 ENCOUNTER — Encounter (HOSPITAL_COMMUNITY)
Admission: RE | Disposition: A | Discharge status: Home / Self Care | Payer: Self-pay | Source: Ambulatory Visit | Attending: General Surgery

## 2015-05-12 ENCOUNTER — Ambulatory Visit (HOSPITAL_COMMUNITY)
Admission: RE | Admit: 2015-05-12 | Discharge: 2015-05-12 | Disposition: A | Discharge status: Home / Self Care | Payer: Medicaid Other | Source: Ambulatory Visit | Attending: General Surgery | Admitting: General Surgery

## 2015-05-12 ENCOUNTER — Encounter (HOSPITAL_COMMUNITY): Payer: Self-pay | Admitting: Certified Registered Nurse Anesthetist

## 2015-05-12 DIAGNOSIS — K811 Chronic cholecystitis: Secondary | ICD-10-CM | POA: Diagnosis not present

## 2015-05-12 DIAGNOSIS — K219 Gastro-esophageal reflux disease without esophagitis: Secondary | ICD-10-CM | POA: Diagnosis not present

## 2015-05-12 DIAGNOSIS — F418 Other specified anxiety disorders: Secondary | ICD-10-CM | POA: Diagnosis not present

## 2015-05-12 HISTORY — PX: CHOLECYSTECTOMY: SHX55

## 2015-05-12 SURGERY — LAPAROSCOPIC CHOLECYSTECTOMY
Anesthesia: General | Site: Abdomen

## 2015-05-12 MED ORDER — LIDOCAINE HCL (PF) 1 % IJ SOLN
INTRAMUSCULAR | Status: AC
  Filled 2015-05-12: qty 5

## 2015-05-12 MED ORDER — CIPROFLOXACIN IN D5W 400 MG/200ML IV SOLN
400.0000 mg | INTRAVENOUS | Status: AC
  Administered 2015-05-12: 400 mg via INTRAVENOUS

## 2015-05-12 MED ORDER — NEOSTIGMINE METHYLSULFATE 10 MG/10ML IV SOLN
INTRAVENOUS | Status: AC
Start: 2015-05-12 — End: 2015-05-12
  Filled 2015-05-12: qty 1

## 2015-05-12 MED ORDER — PROPOFOL 10 MG/ML IV BOLUS
INTRAVENOUS | Status: DC | PRN
  Administered 2015-05-12: 150 mg via INTRAVENOUS

## 2015-05-12 MED ORDER — BUPIVACAINE HCL (PF) 0.5 % IJ SOLN
INTRAMUSCULAR | Status: DC | PRN
  Administered 2015-05-12: 10 mL

## 2015-05-12 MED ORDER — NEOSTIGMINE METHYLSULFATE 10 MG/10ML IV SOLN
INTRAVENOUS | Status: DC | PRN
  Administered 2015-05-12: 4 mg via INTRAVENOUS

## 2015-05-12 MED ORDER — NALOXONE HCL 0.4 MG/ML IJ SOLN
INTRAMUSCULAR | Status: DC | PRN
Start: 2015-05-12 — End: 2015-05-12
  Administered 2015-05-12: 80 ug via INTRAVENOUS

## 2015-05-12 MED ORDER — ONDANSETRON HCL 4 MG/2ML IJ SOLN
INTRAMUSCULAR | Status: AC
  Filled 2015-05-12: qty 2

## 2015-05-12 MED ORDER — BUPIVACAINE HCL (PF) 0.5 % IJ SOLN
INTRAMUSCULAR | Status: AC
  Filled 2015-05-12: qty 30

## 2015-05-12 MED ORDER — ONDANSETRON HCL 4 MG/2ML IJ SOLN: 4.0000 mg | Freq: Once | INTRAMUSCULAR | Status: DC | PRN

## 2015-05-12 MED ORDER — KETOROLAC TROMETHAMINE 30 MG/ML IJ SOLN
30.0000 mg | Freq: Once | INTRAMUSCULAR | Status: AC
  Administered 2015-05-12: 30 mg via INTRAVENOUS
  Filled 2015-05-12: qty 1

## 2015-05-12 MED ORDER — GLYCOPYRROLATE 0.2 MG/ML IJ SOLN
INTRAMUSCULAR | Status: AC
  Filled 2015-05-12: qty 3

## 2015-05-12 MED ORDER — HEMOSTATIC AGENTS (NO CHARGE) OPTIME
TOPICAL | Status: DC | PRN
  Administered 2015-05-12: 1 via TOPICAL

## 2015-05-12 MED ORDER — POVIDONE-IODINE 10 % OINT PACKET
TOPICAL_OINTMENT | CUTANEOUS | Status: DC | PRN
  Administered 2015-05-12: 1 via TOPICAL

## 2015-05-12 MED ORDER — ROCURONIUM BROMIDE 50 MG/5ML IV SOLN
INTRAVENOUS | Status: AC
  Filled 2015-05-12: qty 1

## 2015-05-12 MED ORDER — ROCURONIUM BROMIDE 100 MG/10ML IV SOLN
INTRAVENOUS | Status: DC | PRN
  Administered 2015-05-12: 25 mg via INTRAVENOUS

## 2015-05-12 MED ORDER — LIDOCAINE HCL (PF) 1 % IJ SOLN
INTRAMUSCULAR | Status: DC | PRN
  Administered 2015-05-12: 50 mg

## 2015-05-12 MED ORDER — LACTATED RINGERS IV SOLN
INTRAVENOUS | Status: DC
  Administered 2015-05-12: 1000 mL via INTRAVENOUS

## 2015-05-12 MED ORDER — FENTANYL CITRATE (PF) 100 MCG/2ML IJ SOLN
INTRAMUSCULAR | Status: DC | PRN
  Administered 2015-05-12: 100 ug via INTRAVENOUS
  Administered 2015-05-12 (×2): 50 ug via INTRAVENOUS
  Administered 2015-05-12: 25 ug via INTRAVENOUS

## 2015-05-12 MED ORDER — MIDAZOLAM HCL 2 MG/2ML IJ SOLN
INTRAMUSCULAR | Status: AC
  Filled 2015-05-12: qty 2

## 2015-05-12 MED ORDER — SODIUM CHLORIDE 0.9 % IR SOLN
Status: DC | PRN
  Administered 2015-05-12: 1000 mL

## 2015-05-12 MED ORDER — CHLORHEXIDINE GLUCONATE 4 % EX LIQD: 1.0000 "application " | Freq: Once | CUTANEOUS | Status: DC

## 2015-05-12 MED ORDER — PROPOFOL 10 MG/ML IV BOLUS
INTRAVENOUS | Status: AC
  Filled 2015-05-12: qty 20

## 2015-05-12 MED ORDER — DEXAMETHASONE SODIUM PHOSPHATE 4 MG/ML IJ SOLN
INTRAMUSCULAR | Status: AC
Start: 2015-05-12 — End: 2015-05-12
  Filled 2015-05-12: qty 1

## 2015-05-12 MED ORDER — DEXAMETHASONE SODIUM PHOSPHATE 4 MG/ML IJ SOLN
4.0000 mg | Freq: Once | INTRAMUSCULAR | Status: AC
  Administered 2015-05-12: 4 mg via INTRAVENOUS

## 2015-05-12 MED ORDER — NALOXONE HCL 0.4 MG/ML IJ SOLN
INTRAMUSCULAR | Status: AC
  Filled 2015-05-12: qty 1

## 2015-05-12 MED ORDER — GLYCOPYRROLATE 0.2 MG/ML IJ SOLN
INTRAMUSCULAR | Status: DC | PRN
  Administered 2015-05-12: 0.6 mg via INTRAVENOUS

## 2015-05-12 MED ORDER — FENTANYL CITRATE (PF) 100 MCG/2ML IJ SOLN
25.0000 ug | INTRAMUSCULAR | Status: DC | PRN
  Administered 2015-05-12 (×2): 25 ug via INTRAVENOUS
  Filled 2015-05-12: qty 2

## 2015-05-12 MED ORDER — CIPROFLOXACIN IN D5W 400 MG/200ML IV SOLN
INTRAVENOUS | Status: AC
  Filled 2015-05-12: qty 200

## 2015-05-12 MED ORDER — HYDROCODONE-ACETAMINOPHEN 5-325 MG PO TABS: 1.0000 | ORAL_TABLET | ORAL | Status: DC | PRN

## 2015-05-12 MED ORDER — FENTANYL CITRATE (PF) 250 MCG/5ML IJ SOLN
INTRAMUSCULAR | Status: AC
  Filled 2015-05-12: qty 5

## 2015-05-12 MED ORDER — MIDAZOLAM HCL 2 MG/2ML IJ SOLN
1.0000 mg | INTRAMUSCULAR | Status: DC | PRN
  Administered 2015-05-12: 2 mg via INTRAVENOUS

## 2015-05-12 MED ORDER — ONDANSETRON HCL 4 MG/2ML IJ SOLN
4.0000 mg | Freq: Once | INTRAMUSCULAR | Status: AC
  Administered 2015-05-12: 4 mg via INTRAVENOUS

## 2015-05-12 MED ORDER — POVIDONE-IODINE 10 % EX OINT
TOPICAL_OINTMENT | CUTANEOUS | Status: AC
  Filled 2015-05-12: qty 1

## 2015-05-12 SURGICAL SUPPLY — 42 items
APPLIER CLIP LAPSCP 10X32 DD (CLIP) ×3 IMPLANT
BAG HAMPER (MISCELLANEOUS) ×3 IMPLANT
CHLORAPREP W/TINT 26ML (MISCELLANEOUS) ×3 IMPLANT
CLOTH BEACON ORANGE TIMEOUT ST (SAFETY) ×3 IMPLANT
COVER LIGHT HANDLE STERIS (MISCELLANEOUS) ×6 IMPLANT
DECANTER SPIKE VIAL GLASS SM (MISCELLANEOUS) ×3 IMPLANT
ELECT REM PT RETURN 9FT ADLT (ELECTROSURGICAL) ×3
ELECTRODE REM PT RTRN 9FT ADLT (ELECTROSURGICAL) ×1 IMPLANT
FILTER SMOKE EVAC LAPAROSHD (FILTER) ×3 IMPLANT
FORMALIN 10 PREFIL 120ML (MISCELLANEOUS) ×3 IMPLANT
GLOVE BIO SURGEON STRL SZ7 (GLOVE) ×6 IMPLANT
GLOVE BIOGEL PI IND STRL 7.0 (GLOVE) ×3 IMPLANT
GLOVE BIOGEL PI INDICATOR 7.0 (GLOVE) ×6
GLOVE ECLIPSE 6.5 STRL STRAW (GLOVE) ×3 IMPLANT
GLOVE SURG SS PI 7.5 STRL IVOR (GLOVE) ×3 IMPLANT
GOWN STRL REUS W/ TWL XL LVL3 (GOWN DISPOSABLE) ×1 IMPLANT
GOWN STRL REUS W/TWL LRG LVL3 (GOWN DISPOSABLE) ×6 IMPLANT
GOWN STRL REUS W/TWL XL LVL3 (GOWN DISPOSABLE) ×2
HEMOSTAT SNOW SURGICEL 2X4 (HEMOSTASIS) ×3 IMPLANT
INST SET LAPROSCOPIC AP (KITS) ×3 IMPLANT
IV NS IRRIG 3000ML ARTHROMATIC (IV SOLUTION) IMPLANT
KIT ROOM TURNOVER APOR (KITS) ×3 IMPLANT
MANIFOLD NEPTUNE II (INSTRUMENTS) ×3 IMPLANT
NEEDLE INSUFFLATION 14GA 120MM (NEEDLE) ×3 IMPLANT
NS IRRIG 1000ML POUR BTL (IV SOLUTION) ×3 IMPLANT
PACK LAP CHOLE LZT030E (CUSTOM PROCEDURE TRAY) ×3 IMPLANT
PAD ARMBOARD 7.5X6 YLW CONV (MISCELLANEOUS) ×3 IMPLANT
POUCH SPECIMEN RETRIEVAL 10MM (ENDOMECHANICALS) ×3 IMPLANT
SET BASIN LINEN APH (SET/KITS/TRAYS/PACK) ×3 IMPLANT
SET TUBE IRRIG SUCTION NO TIP (IRRIGATION / IRRIGATOR) IMPLANT
SLEEVE ENDOPATH XCEL 5M (ENDOMECHANICALS) ×3 IMPLANT
SPONGE GAUZE 2X2 8PLY STER LF (GAUZE/BANDAGES/DRESSINGS) ×4
SPONGE GAUZE 2X2 8PLY STRL LF (GAUZE/BANDAGES/DRESSINGS) ×8 IMPLANT
STAPLER VISISTAT (STAPLE) ×3 IMPLANT
SUT VICRYL 0 UR6 27IN ABS (SUTURE) ×3 IMPLANT
TAPE CLOTH SURG 4X10 WHT LF (GAUZE/BANDAGES/DRESSINGS) ×3 IMPLANT
TROCAR ENDO BLADELESS 11MM (ENDOMECHANICALS) ×3 IMPLANT
TROCAR XCEL NON-BLD 5MMX100MML (ENDOMECHANICALS) ×3 IMPLANT
TROCAR XCEL UNIV SLVE 11M 100M (ENDOMECHANICALS) ×3 IMPLANT
TUBING INSUFFLATION (TUBING) ×3 IMPLANT
WARMER LAPAROSCOPE (MISCELLANEOUS) ×3 IMPLANT
YANKAUER SUCT 12FT TUBE ARGYLE (SUCTIONS) ×3 IMPLANT

## 2015-05-12 NOTE — Anesthesia Postprocedure Evaluation (Signed)
Anesthesia Post Note  Patient: Courtney FleetCassandra M Spencer  Procedure(s) Performed: Procedure(s) (LRB): LAPAROSCOPIC CHOLECYSTECTOMY (N/A)  Patient location during evaluation: PACU Anesthesia Type: General Level of consciousness: awake, awake and alert, oriented and responds to stimulation Pain management: pain level controlled Vital Signs Assessment: post-procedure vital signs reviewed and stable Respiratory status: spontaneous breathing and respiratory function stable Cardiovascular status: blood pressure returned to baseline and stable Anesthetic complications: no    Last Vitals:  Filed Vitals:   05/12/15 0930 05/12/15 1115  BP: 109/74 112/82  Pulse:  106  Temp:    Resp: 24 23    Last Pain:  Filed Vitals:   05/12/15 1120  PainSc: Asleep                 [name]

## 2015-05-12 NOTE — Op Note (Signed)
Patient:  Courtney FleetCassandra M Spencer  DOB:  11-26-1995  MRN:  [id number]   Preop Diagnosis:  Chronic cholecystitis  Postop Diagnosis:  Same  Procedure:  Laparoscopic cholecystectomy  Surgeon:  Franky MachoMark Safal Halderman, M.D.  Anes:  Gen. endotracheal  Indications:  Patient is a 20 year old white female who presents with biliary colic secondary to chronic cholecystitis. The risks and benefits of the procedure including bleeding, infection, hepatobiliary injury, and the possibility of an open procedure were fully explained to the patient, who gave informed consent.    Procedure note: The patient was placed the supine position. After induction of general endotracheal anesthesia, the abdomen was prepped and draped using the usual sterile technique with DuraPrep. Surgical site confirmation was performed.  A supraumbilical incision was made down to the fascia. A Veress needle was introduced into the abdominal cavity and confirmation of placement was done using the saline drop test. The abdomen was then insufflated to 16 mmHg pressure. An 11 mm trocar was introduced into the abdominal cavity under direct visualization without difficulty. The patient was placed in reverse Trendelenburg position and an additional 11 mm trocar was placed the epigastric region and 5 mm trochars were placed the right upper quadrant and right flank regions. Liver was inspected and noted to be within normal limits. The gallbladder was retracted in a dynamic fashion in order to provide a critical view of the triangle of Calot. The cystic duct was first identified. Its junction to the infundibulum was fully identified. Endoclips placed proximally and distally on the cystic duct, and the cystic duct was divided. This was likewise done cystic artery. The gallbladder was freed away from the gallbladder fossa using Bovie electrocautery. The gallbladder was delivered through the epigastric trocar site using an Endo Catch bag. The gallbladder fossa was  inspected no abnormal bleeding or bile leakage was noted. Surgicel was placed the gallbladder fossa. All fluid and air were then evacuated from the abdominal cavity prior to removal of the trochars.  All wounds were irrigated normal saline. All wounds were injected with 0.5% Sensorcaine. The supraumbilical fascia was reapproximated using 0 Vicryl interrupted suture. All skin incisions were closed using staples. Betadine ointment and dry sterile dressings were applied.  All tape and needle counts were correct the end of the procedure. Patient was extubated in the operating room and transferred to PACU in stable condition.   Complications:  None  EBL:  Minimal  Specimen:  Gallbladder

## 2015-05-12 NOTE — Interval H&P Note (Signed)
History and Physical Interval Note:  05/12/2015 9:27 AM  Courtney Spencer  has presented today for surgery, with the diagnosis of chronic cholelithiasis  The various methods of treatment have been discussed with the patient and family. After consideration of risks, benefits and other options for treatment, the patient has consented to  Procedure(s): LAPAROSCOPIC CHOLECYSTECTOMY (N/A) as a surgical intervention .  The patient's history has been reviewed, patient examined, no change in status, stable for surgery.  I have reviewed the patient's chart and labs.  Questions were answered to the patient's satisfaction.     [name] A   

## 2015-05-12 NOTE — Anesthesia Preprocedure Evaluation (Addendum)
Anesthesia Evaluation  Patient identified by MRN, date of birth, ID band Patient awake    Reviewed: Allergy & Precautions, NPO status , Patient's Chart, lab work & pertinent test results  Airway Mallampati: I  TM Distance: >3 FB     Dental  (+) Teeth Intact, Dental Advisory Given   Pulmonary    breath sounds clear to auscultation       Cardiovascular negative cardio ROS   Rhythm:Regular Rate:Normal     Neuro/Psych  Headaches, PSYCHIATRIC DISORDERS Anxiety Depression    GI/Hepatic GERD  ,  Endo/Other    Renal/GU      Musculoskeletal   Abdominal   Peds  Hematology   Anesthesia Other Findings   Reproductive/Obstetrics                            Anesthesia Physical Anesthesia Plan  ASA: II  Anesthesia Plan: General   Post-op Pain Management:    Induction: Intravenous, Rapid sequence and Cricoid pressure planned  Airway Management Planned: Oral ETT  Additional Equipment:   Intra-op Plan:   Post-operative Plan: Extubation in OR  Informed Consent: I have reviewed the patients History and Physical, chart, labs and discussed the procedure including the risks, benefits and alternatives for the proposed anesthesia with the patient or authorized representative who has indicated his/her understanding and acceptance.     Plan Discussed with:   Anesthesia Plan Comments:         Anesthesia Quick Evaluation

## 2015-05-12 NOTE — Interval H&P Note (Signed)
History and Physical Interval Note:  05/12/2015 9:27 AM  Courtney Spencer  has presented today for surgery, with the diagnosis of chronic cholelithiasis  The various methods of treatment have been discussed with the patient and family. After consideration of risks, benefits and other options for treatment, the patient has consented to  Procedure(s): LAPAROSCOPIC CHOLECYSTECTOMY (N/A) as a surgical intervention .  The patient's history has been reviewed, patient examined, no change in status, stable for surgery.  I have reviewed the patient's chart and labs.  Questions were answered to the patient's satisfaction.     Franky MachoJENKINS,Lynnea Vandervoort A

## 2015-05-12 NOTE — Discharge Instructions (Signed)
Laparoscopic Cholecystectomy, Care After °Refer to this sheet in the next few weeks. These instructions provide you with information about caring for yourself after your procedure. Your health care provider may also give you more specific instructions. Your treatment has been planned according to current medical practices, but problems sometimes occur. Call your health care provider if you have any problems or questions after your procedure. °WHAT TO EXPECT AFTER THE PROCEDURE °After your procedure, it is common to have: °· Pain at your incision sites. You will be given pain medicines to control your pain. °· Mild nausea or vomiting. This should improve after the first 24 hours. °· Bloating and possible shoulder pain from the gas that was used during the procedure. This will improve after the first 24 hours. °HOME CARE INSTRUCTIONS °Incision Care °· Follow instructions from your health care provider about how to take care of your incisions. Make sure you: °¨ Wash your hands with soap and water before you change your bandage (dressing). If soap and water are not available, use hand sanitizer. °¨ Change your dressing as told by your health care provider. °¨ Leave stitches (sutures), skin glue, or adhesive strips in place. These skin closures may need to be in place for 2 weeks or longer. If adhesive strip edges start to loosen and curl up, you may trim the loose edges. Do not remove adhesive strips completely unless your health care provider tells you to do that. °· Do not take baths, swim, or use a hot tub until your health care provider approves. Ask your health care provider if you can take showers. You may only be allowed to take sponge baths for bathing. °General Instructions °· Take over-the-counter and prescription medicines only as told by your health care provider. °· Do not drive or operate heavy machinery while taking prescription pain medicine. °· Return to your normal diet as told by your health care  provider. °· Do not lift anything that is heavier than 10 lb (4.5 kg). °· Do not play contact sports for one week or until your health care provider approves. °SEEK MEDICAL CARE IF:  °· You have redness, swelling, or pain at the site of your incision. °· You have fluid, blood, or pus coming from your incision. °· You notice a bad smell coming from your incision area. °· Your surgical incisions break open. °· You have a fever. °SEEK IMMEDIATE MEDICAL CARE IF: °· You develop a rash. °· You have difficulty breathing. °· You have chest pain. °· You have increasing pain in your shoulders (shoulder strap areas). °· You faint or have dizzy episodes while you are standing. °· You have severe pain in your abdomen. °· You have nausea or vomiting that lasts for more than one day. °  °This information is not intended to replace advice given to you by your health care provider. Make sure you discuss any questions you have with your health care provider. °  °Document Released: 02/11/2005 Document Revised: 11/02/2014 Document Reviewed: 09/23/2012 °Elsevier Interactive Patient Education ©2016 [employer] ° °

## 2015-05-12 NOTE — Addendum Note (Signed)
Addendum  created 05/12/15 1131 by Shary DecampVedwattie Tomika Eckles, CRNA   Modules edited: Anesthesia Medication Administration

## 2015-05-12 NOTE — Transfer of Care (Signed)
Immediate Anesthesia Transfer of Care Note  Patient: Courtney FleetCassandra M Spencer  Procedure(s) Performed: Procedure(s): LAPAROSCOPIC CHOLECYSTECTOMY (N/A)  Patient Location: PACU  Anesthesia Type:General  Level of Consciousness: awake, alert , patient cooperative and responds to stimulation  Airway & Oxygen Therapy: Patient Spontanous Breathing and Patient connected to face mask oxygen  Post-op Assessment: Report given to RN, Post -op Vital signs reviewed and stable and Patient moving all extremities X 4  Post vital signs: Reviewed and stable  Last Vitals:  Filed Vitals:   05/12/15 0925 05/12/15 0930  BP: 106/76 109/74  Temp:    Resp: 28 24    Complications: No apparent anesthesia complications

## 2015-05-12 NOTE — Anesthesia Procedure Notes (Signed)
Procedure Name: Intubation Date/Time: 05/12/2015 9:47 AM Performed by: Patrcia DollyMOSES, Rayyan Orsborn Pre-anesthesia Checklist: Patient identified, Patient being monitored, Timeout performed, Emergency Drugs available and Suction available Patient Re-evaluated:Patient Re-evaluated prior to inductionOxygen Delivery Method: Circle System Utilized Preoxygenation: Pre-oxygenation with 100% oxygen Intubation Type: IV induction Ventilation: Mask ventilation without difficulty Laryngoscope Size: Miller and 2 Grade View: Grade I Tube type: Oral Tube size: 6.0 mm Number of attempts: 1 Airway Equipment and Method: Stylet Placement Confirmation: ETT inserted through vocal cords under direct vision,  positive ETCO2 and breath sounds checked- equal and bilateral Secured at: 21 cm Tube secured with: Tape Dental Injury: Teeth and Oropharynx as per pre-operative assessment

## 2015-05-15 ENCOUNTER — Encounter (HOSPITAL_COMMUNITY): Payer: Self-pay | Admitting: General Surgery

## 2015-08-16 ENCOUNTER — Ambulatory Visit (INDEPENDENT_AMBULATORY_CARE_PROVIDER_SITE_OTHER): Payer: Medicaid Other | Admitting: Nurse Practitioner

## 2015-08-16 ENCOUNTER — Encounter: Payer: Self-pay | Admitting: Nurse Practitioner

## 2015-08-16 VITALS — BP 110/77 | HR 63 | Temp 97.3°F | Ht 59.0 in | Wt 176.2 lb

## 2015-08-16 DIAGNOSIS — R197 Diarrhea, unspecified: Secondary | ICD-10-CM | POA: Diagnosis not present

## 2015-08-16 DIAGNOSIS — R109 Unspecified abdominal pain: Secondary | ICD-10-CM | POA: Diagnosis not present

## 2015-08-16 NOTE — Progress Notes (Signed)
cc'ed to pcp °

## 2015-08-16 NOTE — Patient Instructions (Signed)
1. Continue taking Levsin as needed for abdominal pain/diarrhea. 2. Take align probiotic once a day. We'll provide you samples for 1 month. 3. Return for follow-up in 3 months.

## 2015-08-16 NOTE — Assessment & Plan Note (Signed)
Significant improvement in diarrhea in addition to abdominal pain. Has noted some recurrence a few days ago likely due to gastroenteritis, with acute exacerbation resolved. Recommend continue Levsin as needed, align probiotic once a day with samples to last a month provided. Return for follow-up in 3 months. Symptoms likely due to gallbladder disease. May have some postoperative diarrhea as often occurs after cholecystectomy.

## 2015-08-16 NOTE — Progress Notes (Signed)
Referring Provider: Rebecka ApleyHemberg, Katherine V, RN Primary Care Physician:  Rebecka ApleyHEMBERG, KATHERINE V, RN Primary GI:  Dr. Darrick PennaFields   Chief Complaint  Patient presents with  . Follow-up    HPI:   Courtney Spencer is a 20 y.o. female who presents for follow-up on abdominal pain and diarrhea. She was last seen in our office 04/18/2015 for the same. At that time her symptoms are overall improved, still taking Levsin at times. Pain is lower abdominal periumbilical, sometimes worse after eating, lasts until she takes Levsin. Typical stool pattern is daily bowel movement which is usually diarrhea, occasional formed stool, pain not improved with bowel movement. No other GI symptoms. States she had seen the surgeon and the option was left up to her on whether to have her gallbladder out and feels that she will likely proceed with this. On exam she had tenderness to the right upper quadrant and periumbilical region, decreased gallbladder function on HIDA scan previously seen. Recommend she follow up with surgeon discussed possibility of lap cholecystectomy, continue Levsin, return for follow-up in 4 months. Endoscopic cholecystectomy appears to been performed 05/12/2015 without compilations.  Today she states she's done well after surgery. Initially abdominal pain was resolved, but has slowly started to come back along with diarrhea although not as frequently and not as severe. Levsin helps. Has symptoms about once every 2-3 weeks; previously was having daily symptoms. Did have an episode of nausea and abdominal pain, decreased appetite, and diarrhea. Thinks it was a stomach virus, they have since resolved. Denies hematochezia, melena, fever, chills, unintentional weight loss. Denies any other upper or lower GI symptoms.  Past Medical History  Diagnosis Date  . Cat scratch fever   . Heart murmur of newborn   . UTI (lower urinary tract infection)   . Helicobacter pylori ab+     H.pylori serology positive in Jan  2015, treated with Amoxicillin and Flagyl at that time. Returned to clinic Feb 2015 and had rechecked, with a different provider treating with Amoxicillin and Biaxin. H.pylori stool antigen ordered April 2015 and negative.  . Ovarian cyst   . Other and unspecified ovarian cyst 08/09/2013  . Hematuria 08/09/2013  . Migraines   . History of kidney stones   . Anxiety   . Depression   . GERD (gastroesophageal reflux disease)     Past Surgical History  Procedure Laterality Date  . None    . Esophagogastroduodenoscopy N/A 08/13/2013    Dr. Darrick PennaFields: nodular gastritis, negative H.pylori  . Cholecystectomy N/A 05/12/2015    Procedure: LAPAROSCOPIC CHOLECYSTECTOMY;  Surgeon: Franky MachoMark Jenkins, MD;  Location: [facility];  Service: General;  Laterality: N/A;    Current Outpatient Prescriptions  Medication Sig Dispense Refill  . hyoscyamine (LEVSIN SL) 0.125 MG SL tablet Place 1 tablet (0.125 mg total) under the tongue every 4 (four) hours as needed. 60 tablet 3  . pantoprazole (PROTONIX) 40 MG tablet TAKE ONE TABLET BY MOUTH ONCE DAILY. TAKE 30 MINUTES BEFORE BREAKFAST 30 tablet 5  . propranolol (INDERAL) 60 MG tablet Take 60 mg by mouth 3 (three) times daily.    . Vitamin D, Ergocalciferol, (DRISDOL) 50000 UNITS CAPS capsule Take 50,000 Units by mouth every 7 (seven) days.    Marland Kitchen. HYDROcodone-acetaminophen (NORCO/VICODIN) 5-325 MG tablet Take 1-2 tablets by mouth every 4 (four) hours as needed for moderate pain. (Patient not taking: Reported on 08/16/2015) 50 tablet 0   No current facility-administered medications for this visit.    Allergies as of  08/16/2015  . (No Known Allergies)    Family History  Problem Relation Age of Onset  . Colon cancer      unknown  . Gallbladder disease Mother     gallbladder was removed  . Migraines Mother   . Migraines Sister   . Hypertension Sister   . ADD / ADHD Brother   . Hypertension Maternal Grandmother   . Cancer Maternal Grandfather     lung  . Cancer  Paternal Grandfather     lung  . Seizures Sister   . Scoliosis Sister   . ADD / ADHD Brother     Social History   Social History  . Marital Status: Single    Spouse Name: N/A  . Number of Children: N/A  . Years of Education: N/A   Occupational History  . 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    Social History Main Topics  . Smoking status: Never Smoker   . Smokeless tobacco: Never Used     Comment: Never really smoked  . Alcohol Use: No  . Drug Use: No  . Sexual Activity: No   Other Topics Concern  . None   Social History Narrative    Review of Systems: 10-point ROS negative except as per HPI.   Physical Exam: BP 110/77 mmHg  Pulse 63  Temp(Src) 97.3 F (36.3 C)  Ht  (1.499 m)  Wt 176 lb 3.2 oz (79.924 kg)  BMI 35.57 kg/m2  LMP 08/16/2015 General:   Alert and oriented. Pleasant and cooperative. Well-nourished and well-developed.  Ears:  Normal auditory acuity. Cardiovascular:  S1, S2 present without murmurs appreciated. Extremities without clubbing or edema. Respiratory:  Clear to auscultation bilaterally. No wheezes, rales, or rhonchi. No distress.  Gastrointestinal:  +BS, rounded but soft, and non-distended. Minimal surgical area/RUQ TTP. No guarding or rebound. No masses appreciated.  Rectal:  Deferred  Musculoskalatal:  Symmetrical without gross deformities. Skin:  Three laparoscopic abdominal surgical scars noted consistent with recent lap chole, well healed, without erythema, edema, or drainage.. Neurologic:  Alert and oriented x4;  grossly normal neurologically. Psych:  Alert and cooperative. Normal mood and affect. Heme/Lymph/Immune: No excessive bruising noted.    08/16/2015 10:03 AM   Disclaimer: This note was dictated with voice recognition software. Similar sounding words can inadvertently be transcribed and may not be corrected upon review.

## 2015-08-16 NOTE — Assessment & Plan Note (Signed)
Significant improvement in symptoms postoperatively. She did have recurrence of abdominal pain associated with diarrhea and nausea but 3-4 days ago which she feels is due to a viral gastroenteritis. Symptoms are improved. Recommend continue Levsin as needed, take a probiotic once a day for which I'll provide samples to last a month, return for follow-up in 3 months for long-term symptom progression. At this time it seems gallbladder disease is likely culprit for her symptoms.
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
## 2015-11-16 ENCOUNTER — Ambulatory Visit: Payer: Medicaid Other | Admitting: Nurse Practitioner

## 2016-03-18 DIAGNOSIS — Z0000 Encounter for general adult medical examination without abnormal findings: Secondary | ICD-10-CM | POA: Insufficient documentation

## 2016-08-07 ENCOUNTER — Other Ambulatory Visit: Payer: Medicaid Other | Admitting: Women's Health

## 2016-08-19 ENCOUNTER — Other Ambulatory Visit (HOSPITAL_COMMUNITY)
Admission: RE | Admit: 2016-08-19 | Discharge: 2016-08-19 | Disposition: A | Discharge status: Home / Self Care | Payer: Medicaid Other | Source: Ambulatory Visit | Attending: Obstetrics and Gynecology | Admitting: Obstetrics and Gynecology

## 2016-08-19 ENCOUNTER — Ambulatory Visit (INDEPENDENT_AMBULATORY_CARE_PROVIDER_SITE_OTHER): Payer: 59 | Admitting: Women's Health

## 2016-08-19 ENCOUNTER — Encounter: Payer: Self-pay | Admitting: Women's Health

## 2016-08-19 VITALS — BP 102/68 | HR 70 | Ht 59.0 in | Wt 188.0 lb

## 2016-08-19 DIAGNOSIS — Z308 Encounter for other contraceptive management: Secondary | ICD-10-CM

## 2016-08-19 DIAGNOSIS — Z3009 Encounter for other general counseling and advice on contraception: Secondary | ICD-10-CM | POA: Insufficient documentation

## 2016-08-19 DIAGNOSIS — G43109 Migraine with aura, not intractable, without status migrainosus: Secondary | ICD-10-CM | POA: Insufficient documentation

## 2016-08-19 DIAGNOSIS — Z01419 Encounter for gynecological examination (general) (routine) without abnormal findings: Secondary | ICD-10-CM

## 2016-08-19 DIAGNOSIS — Z113 Encounter for screening for infections with a predominantly sexual mode of transmission: Secondary | ICD-10-CM

## 2016-08-19 NOTE — Patient Instructions (Signed)
If you decide for pelvic ultrasound, call when your next period starts to have ultrasound for as soon as period goes off  Ibuprofen/Aleve/Naprosyn for pain Warm baths Heating pads

## 2016-08-19 NOTE — Progress Notes (Signed)
Subjective:   Courtney FleetCassandra M Spencer is a 21 y.o. G0P0 Caucasian female here for a routine FP Mcaid well-woman exam.  Patient's last menstrual period was 07/22/2016.    Current complaints: pelvic pain R>L w/ position changes, walking, sex x >7081yrs. I saw her in 2016 for same, had normal pelvic u/s at that time. States pain is not any worse than it was then, just bothersome. Denies abnormal vaginal d/c, itching/odor/irritation. Denies urinary problems or constipation/bowel problems. States she does have a h/o ovarian cysts. Is not on any contraception at this time, uses condoms. Was on depo for awhile and gained 50lbs. Periods are irregular, sometimes skips a month, sometimes has period at beginning and end of month. Discussed I would normally recommend a pelvic u/s again to reassess, however her insurance will not cover, so she would have to pay out of pocket- pt states she does not want to do that at this time. Discussed possibility of ovarian cysts, endometriosis, etc. Would recommend CHC for both- however she has migraines w/ aura so estrogen is contraindicated for her.  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  Past Medical History Past Medical History:  Diagnosis Date  . Anxiety   . Cat scratch fever   . Depression   . GERD (gastroesophageal reflux disease)   . Heart murmur of newborn   . Helicobacter pylori ab+    H.pylori serology positive in Jan 2015, treated with Amoxicillin and Flagyl at that time. Returned to clinic Feb 2015 and had  rechecked, with a different provider treating with Amoxicillin and Biaxin. H.pylori stool antigen ordered April 2015 and negative.  . Hematuria 08/09/2013  . History of kidney stones   . Migraines   . Other and unspecified ovarian cyst 08/09/2013  . Ovarian cyst   . UTI (lower urinary tract infection)     Past Surgical History Past Surgical History:  Procedure Laterality Date  . CHOLECYSTECTOMY N/A 05/12/2015   Procedure: LAPAROSCOPIC CHOLECYSTECTOMY;  Surgeon: Franky MachoMark Jenkins, MD;  Location: [facility];  Service: General;  Laterality: N/A;  . ESOPHAGOGASTRODUODENOSCOPY N/A 08/13/2013   Dr. Darrick PennaFields: nodular gastritis, negative H.pylori  . None      Gynecologic History G0P0  Patient's last menstrual period was 07/22/2016. Contraception: condoms Last Pap: never. Results were: n/a Last mammogram: never. Results were: n/a Last TCS: never  Obstetric History OB History  Gravida Para Term Preterm AB Living  0            SAB TAB Ectopic Multiple Live Births                   Current Medications No current outpatient prescriptions on file prior to visit.   No current facility-administered medications on file prior to visit.     Review of Systems Patient denies any headaches, blurred vision, shortness of breath, chest pain, abdominal pain, problems with bowel movements, urination, or intercourse.  Objective:  BP 102/68 (BP Location: Right Arm, Patient Position: Sitting, Cuff Size: Normal)   Pulse 70   Ht 4\' 11"  (1.499 m)   Wt 188 lb (85.3  kg)   LMP 07/22/2016   BMI 37.97 kg/m  Physical Exam  General:  Well developed, well nourished, no acute distress. She is alert and oriented x3. Skin:  Warm and dry Neck:  Midline trachea, no thyromegaly or nodules Cardiovascular: Regular rate and rhythm, no murmur heard Lungs:  Effort normal, all lung fields clear to auscultation bilaterally Breasts:  No dominant palpable mass, retraction, or nipple discharge Abdomen:  Soft, non tender, no  hepatosplenomegaly or masses Pelvic:  External genitalia is normal in appearance.  The vagina is normal in appearance. The cervix is bulbous, no CMT.  Thin prep pap is done w/ reflex HR HPV cotesting. Uterus is felt to be normal size, shape, and contour- unable to assess adequately d/t body habitus.  No adnexal masses noted, however there is bilateral adnexal tenderness, R>L. Extremities:  No swelling or varicosities noted Psych:  She has a normal mood and affect  Assessment:   Healthy FP Mcaid well-woman exam Pelvic pain, R>L H/O ovarian cysts Irregular menses Obesity Migraines w/ aura  Plan:  GC/CT from pap, HIV, RPR today Let us know if decides for pelvic u/s, will need to schedule for as soon as she comes off of a period Aleve, ibuprofen, naprosyn, warm baths/showers, heating pad for pelvic pain If pain ever becomes severe she is to let us know or go to ED  Declines contraception, wants to continue condoms F/U 108yr for physical, or sooner if needed Mammogram @21yo  or sooner if problems Colonoscopy @21yo  or sooner if problems  Marge Duncans CNM, [facility] 08/19/2016 1:56 PM

## 2016-08-20 LAB — HIV ANTIBODY (ROUTINE TESTING W REFLEX): HIV SCREEN 4TH GENERATION: NONREACTIVE

## 2016-08-20 LAB — RPR: RPR Ser Ql: NONREACTIVE

## 2016-08-21 LAB — CYTOLOGY - PAP
CHLAMYDIA, DNA PROBE: NEGATIVE
Diagnosis: NEGATIVE
NEISSERIA GONORRHEA: NEGATIVE

## 2016-11-21 DIAGNOSIS — L74519 Primary focal hyperhidrosis, unspecified: Secondary | ICD-10-CM | POA: Insufficient documentation

## 2017-04-09 NOTE — Progress Notes (Signed)
REVIEWED-NO ADDITIONAL RECOMMENDATIONS. 

## 2017-04-14 ENCOUNTER — Encounter: Payer: Self-pay | Admitting: Neurology

## 2017-06-23 ENCOUNTER — Encounter: Payer: Self-pay | Admitting: Neurology

## 2017-07-31 ENCOUNTER — Ambulatory Visit: Payer: Self-pay | Admitting: Neurology

## 2018-06-22 NOTE — Progress Notes (Signed)
REVIEWED-NO ADDITIONAL RECOMMENDATIONS. 

## 2018-06-23 NOTE — Progress Notes (Signed)
REVIEWED-NO ADDITIONAL RECOMMENDATIONS. 

## 2018-08-06 ENCOUNTER — Telehealth: Payer: Self-pay | Admitting: Women's Health

## 2018-08-06 NOTE — Telephone Encounter (Signed)
Patient called, stated that her private area hurts when she gets up or coughs x 2 days.  Also cramps really bad when she has her cycle.  Please advise.  [phone]

## 2018-08-06 NOTE — Telephone Encounter (Signed)
Spoke with pt. Pt states when she coughs, she has pain in vaginal area. Also hurts when she is getting up from laying down. No discharge. Pain with sex. Advised she needs to be seen. Pt voiced understanding. Call transferred to front desk for appt. [place]

## 2018-08-07 ENCOUNTER — Encounter: Payer: Self-pay | Admitting: *Deleted

## 2018-08-10 ENCOUNTER — Ambulatory Visit (INDEPENDENT_AMBULATORY_CARE_PROVIDER_SITE_OTHER): Payer: Commercial Managed Care - PPO | Admitting: Adult Health

## 2018-08-10 ENCOUNTER — Encounter: Payer: Self-pay | Admitting: Adult Health

## 2018-08-10 ENCOUNTER — Other Ambulatory Visit: Payer: Self-pay

## 2018-08-10 VITALS — BP 106/66 | HR 73 | Ht 59.0 in | Wt 190.0 lb

## 2018-08-10 DIAGNOSIS — R102 Pelvic and perineal pain unspecified side: Secondary | ICD-10-CM

## 2018-08-10 DIAGNOSIS — N941 Unspecified dyspareunia: Secondary | ICD-10-CM

## 2018-08-10 HISTORY — DX: Unspecified dyspareunia: N94.10

## 2018-08-10 NOTE — Progress Notes (Signed)
Patient ID: Courtney Spencer, female   DOB: 1996-02-20, 23 y.o.   MRN: [id number] History of Present Illness: Courtney Spencer is a 23 year old white female, married, G0P0, in complaining of pelvic pain, pain in vagina and pain with sex. PCP is Claris Gladden, NP.    Current Medications, Allergies, Past Medical History, Past Surgical History, Family History and Social History were reviewed in [employer] record.     Review of Systems: Sharp pain in vagina for about a week on and off esp with coughing and rising from laying position +pain with sex for about a month Pelvic pain for over a year Denies any problems with urination or bowel movement Periods last about 7 days and sometimes painful, but not heavy Is not on birth control     Physical Exam:BP 106/66 (BP Location: Left Arm, Patient Position: Sitting, Cuff Size: Large)   Pulse 73   Ht 4\' 11"  (1.499 m)   Wt 190 lb (86.2 kg)   LMP 07/20/2018   BMI 38.38 kg/m  General:  Well developed, well nourished, no acute distress Skin:  Warm and dry Pelvic:  External genitalia is normal in appearance, no lesions.  The vagina is normal in appearance. Urethra has no lesions or masses, but tender posteriorly The cervix is nulliparous and no CMT.  Uterus is felt to be normal size, shape, and contour.  No adnexal masses, + tenderness noted.Bladder is + tender, no masses felt.No bulging like with hernia, but she says sometimes she ?bulge that she pushes on. Psych:  No mood changes, alert and cooperative,seems happy Fall risk is low. Examination chaperoned by Levy Pupa LPN.   Impression: 1. Pelvic pain in female   2. Vaginal pain   3. Dyspareunia in female       Plan: Return in 1 week for GYN [place] Review handouts on pelvic pain and dyspareunia

## 2018-08-10 NOTE — Patient Instructions (Signed)
Dyspareunia, Female  Dyspareunia is pain that is associated with sexual activity. This can affect any part of the genitals or lower abdomen, and there are many possible causes. This condition ranges from mild to severe. Depending on the cause, dyspareunia may get better with treatment, or it may return (recur) over time. What are the causes? The cause of this condition is not always known. Possible causes include:  Cancer.  Psychological factors, such as depression, anxiety, or previous traumatic experiences.  Severe pain and tenderness of the skin around the vagina (vulva) when it is touched (vulvar vestibulitis syndrome).  Infection of the pelvis or the vulva.  Infection of the vagina.  Painful, involuntary tightening (contraction) of the vaginal muscles when anything is put inside the vagina (vaginismus).  Allergic reaction.  Ovarian cysts.  Solid growths of tissue (tumors) in the ovaries or the uterus.  Scar tissue in the ovaries, vagina, or pelvis.  Vaginal dryness.  Thinning of the tissue (atrophy) of the vulva and vagina.  Skin conditions that affect the vulva (vulvar dermatoses), such as lichen sclerosus or lichen planus.  Endometriosis.  Tubal pregnancy.  A tilted uterus.  Uterine prolapse.  Adhesions in the vagina.  Bladder problems.  Intestinal problems.  Certain medicines.  Medical conditions such as diabetes, arthritis, or thyroid disease. What increases the risk? The following factors may make you more likely to develop this condition:  Having experienced physical or sexual trauma.  Having given birth more than once.  Taking birth control pills.  Having gone through menopause.  Having recently given birth, typically within the past 3-6 months.  Breastfeeding. What are the signs or symptoms? The main symptom of this condition is pain in any part of the genitals or lower abdomen during or after sexual activity. This may include pain during  sexual arousal, genital stimulation, or orgasm. Pain may get worse when anything is inserted into the vagina, or when the genitals are touched in any way, such as when sitting or wearing pants. Pain can range from mild to severe, depending on the cause of the condition. In some cases, symptoms go away with treatment and return (recur) at a later date. How is this diagnosed? This condition may be diagnosed based on:  Your symptoms, including: ? Where your pain is located. ? When your pain occurs.  Your medical history.  A physical exam. This may include a pelvic exam and a Pap test. This is a screening test that is used to check for signs of cancer of the vagina, cervix, and uterus.  Tests, including: ? Blood tests. ? Ultrasound. This uses sound waves to make a picture of the area that is being tested. ? Urine culture. This test involves checking a urine sample for signs of infection. ? Culture test. This is when your health care provider uses a swab to collect a sample of vaginal fluid. The sample is checked for signs of infection. ? X-rays. ? MRI. ? CT scan. ? Laparoscopy. This is a procedure in which a small incision is made in your lower abdomen and a lighted, pencil-sized instrument (laparoscope) is passed through the incision and used to look inside your pelvis. You may be referred to a health care provider who specializes in women's health (gynecologist). In some cases, diagnosing the cause of dyspareunia can be difficult. How is this treated? Treatment depends on the cause of your condition and your symptoms. In most cases, you may need to stop sexual activity until your symptoms improve. Treatment  may include:  Lubricants.  Kegel exercises or vaginal dilators.  Medicated skin creams.  Medicated vaginal creams.  Hormonal therapy.  Antibiotic medicine to prevent or fight infection.  Medicines that help to relieve pain.  Medicines that treat depression (antidepressants).   Psychological counseling.  Sex therapy.  Surgery. Follow these instructions at home: Lifestyle  Avoid tight clothing and irritating materials around your genital and abdominal area.  Use water-based lubricants as needed. Avoid oil-based lubricants.  Do not use any products that irritate you. This may include certain condoms, spermicides, lubricants, soaps, tampons, vaginal sprays, or douches.  Always practice safe sex. Talk with your health care provider about which form of birth control (contraception) is best for you.  Maintain open communication with your sexual partner. General instructions  Take over-the-counter and prescription medicines only as told by your health care provider.  If you had tests done, it is your responsibility to get your tests results. Ask your health care provider or the department performing the test when your results will be ready.  Urinate before you engage in sexual activity.  Consider joining a support group.  Keep all follow-up visits as told by your health care provider. This is important. Contact a health care provider if:  You develop vaginal bleeding after sexual intercourse.  You develop a lump at the opening of your vagina. Seek medical care even if the lump is painless.  You have: ? Abnormal vaginal discharge. ? Vaginal dryness. ? Itchiness or irritation of your vulva or vagina. ? A new rash. ? Symptoms that get worse or do not improve with treatment. ? A fever. ? Pain when you urinate. ? Blood in your urine. Get help right away if:  You develop severe pain in your abdomen during or shortly after sexual intercourse.  You pass out after having sexual intercourse. This information is not intended to replace advice given to you by your health care provider. Make sure you discuss any questions you have with your health care provider. Document Released: 03/03/2007 Document Revised: 06/23/2015 Document Reviewed: 09/13/2014  Elsevier Interactive Patient Education  2019 [employer] Pelvic Pain, Female Pelvic pain is pain in your lower abdomen, below your belly button and between your hips. The pain may start suddenly (be acute), keep coming back (be recurring), or last a long time (become chronic). Pelvic pain that lasts longer than 6 months is considered chronic. Pelvic pain may affect your:  Reproductive organs.  Urinary system.  Digestive tract.  Musculoskeletal system. There are many potential causes of pelvic pain. Sometimes, the pain can be a result of digestive or urinary conditions, strained muscles or ligaments, or reproductive conditions. Sometimes the cause of pelvic pain is not known. Follow these instructions at home:   Take over-the-counter and prescription medicines only as told by your health care provider.  Rest as told by your health care provider.  Do not have sex if it hurts.  Keep a journal of your pelvic pain. Write down: ? When the pain started. ? Where the pain is located. ? What seems to make the pain better or worse, such as food or your period (menstrual cycle). ? Any symptoms you have along with the pain.  Keep all follow-up visits as told by your health care provider. This is important. Contact a health care provider if:  Medicine does not help your pain.  Your pain comes back.  You have new symptoms.  You have abnormal vaginal discharge or bleeding, including bleeding after menopause.  You have a fever or chills.  You are constipated.  You have blood in your urine or stool.  You have foul-smelling urine.  You feel weak or light-headed. Get help right away if:  You have sudden severe pain.  Your pain gets steadily worse.  You have severe pain along with fever, nausea, vomiting, or excessive sweating.  You lose consciousness. Summary  Pelvic pain is pain in your lower abdomen, below your belly button and between your hips.  There are many  potential causes of pelvic pain.  Keep a journal of your pelvic pain. This information is not intended to replace advice given to you by your health care provider. Make sure you discuss any questions you have with your health care provider. Document Released: 01/09/2004 Document Revised: 07/30/2017 Document Reviewed: 07/30/2017 Elsevier Interactive Patient Education  2019 [employer].

## 2018-08-17 ENCOUNTER — Ambulatory Visit: Payer: Commercial Managed Care - PPO

## 2018-08-17 ENCOUNTER — Other Ambulatory Visit: Payer: Self-pay

## 2018-08-17 DIAGNOSIS — N941 Unspecified dyspareunia: Secondary | ICD-10-CM

## 2018-08-17 DIAGNOSIS — R102 Pelvic and perineal pain: Secondary | ICD-10-CM

## 2018-08-17 NOTE — Progress Notes (Signed)
PELVIC US TA/TV:homogeneous anteverted uterus,WNL,EEC 4.8 mm,normal ovaries bilat,ovaries appear mobile,no free fluid or pain during ultrasound

## 2018-11-11 ENCOUNTER — Ambulatory Visit: Payer: Commercial Managed Care - PPO | Admitting: Advanced Practice Midwife

## 2019-10-07 ENCOUNTER — Ambulatory Visit: Payer: Self-pay | Admitting: Adult Health

## 2019-10-25 ENCOUNTER — Ambulatory Visit (INDEPENDENT_AMBULATORY_CARE_PROVIDER_SITE_OTHER): Payer: 59 | Admitting: Adult Health

## 2019-10-25 ENCOUNTER — Encounter: Payer: Self-pay | Admitting: Adult Health

## 2019-10-25 VITALS — BP 105/73 | HR 76 | Ht 59.0 in | Wt 189.5 lb

## 2019-10-25 DIAGNOSIS — N946 Dysmenorrhea, unspecified: Secondary | ICD-10-CM

## 2019-10-25 DIAGNOSIS — R102 Pelvic and perineal pain: Secondary | ICD-10-CM | POA: Insufficient documentation

## 2019-10-25 DIAGNOSIS — N941 Unspecified dyspareunia: Secondary | ICD-10-CM | POA: Diagnosis not present

## 2019-10-25 DIAGNOSIS — G8929 Other chronic pain: Secondary | ICD-10-CM

## 2019-10-25 HISTORY — DX: Dysmenorrhea, unspecified: N94.6

## 2019-10-25 HISTORY — DX: Pelvic and perineal pain: R10.2

## 2019-10-25 HISTORY — DX: Other chronic pain: G89.29

## 2019-10-25 MED ORDER — NORETHINDRONE 0.35 MG PO TABS: 1.0000 | ORAL_TABLET | Freq: Every day | ORAL | 11 refills | Status: DC

## 2019-10-25 NOTE — Progress Notes (Signed)
  Subjective:     Patient ID: Courtney Spencer, female   DOB: 02-28-1995, 24 y.o.   MRN: [id number]  HPI Courtney Spencer is a 24 year old white female,marrried, G0P0, in complaining of pelvic pain for years. Has pain with sex and with her period, had normal GYN [place] 08/17/2018, had normal pap 08/19/16. No current PCP.   Review of Systems Has pain in pelvic area for years, worse before period, has pain with period and with sex, pain even pain with walking  Reviewed past medical,surgical, social and family history. Reviewed medications and allergies.     Objective:   Physical Exam BP 105/73 (BP Location: Left Arm, Patient Position: Sitting, Cuff Size: Normal)   Pulse 76   Ht 4\' 11"  (1.499 m)   Wt 189 lb 8 oz (86 kg)   LMP 10/23/2019   BMI 38.27 kg/m Skin warm and dry.Pelvic: external genitalia is normal in appearance no lesions, vagina: +period blood,urethra has no lesions or masses noted, cervix:smooth,no CMT, uterus: normal size, shape and contour, + tender, no masses felt, adnexa: no masses +vtenderness noted. Bladder is non tender and no masses felt. Fall risk is low Examination chaperoned by 10/25/2019 LPN   [facility] - 10/25/19 1542      Pregnancy Intention Screening   Does the patient want to become pregnant in the next year? No    Does the patient's partner want to become pregnant in the next year? No    Would the patient like to discuss contraceptive options today? No      Contraception Wrap Up   Current Method No Method - Other Reason    End Method No Method - Other Reason;Oral Contraceptive    Contraception Counseling Provided Yes             Assessment:     1. Dyspareunia in female   2. Chronic pelvic pain in female Will start Micronor today and review handout on orilissa, I feel she may have endometriosis,she will let me know if she wants to try it   Meds ordered this encounter  Medications  . norethindrone (MICRONOR) 0.35 MG tablet    Sig: Take 1 tablet  (0.35 mg total) by mouth daily.    Dispense:  28 tablet    Refill:  11    Order Specific Question:   Supervising Provider    Answer:   04-27-1985, LUTHER H [[id number]]   3. Dysmenorrhea     Plan:     Return in 6 weeks for pap and physical

## 2019-10-26 ENCOUNTER — Ambulatory Visit: Payer: Self-pay | Admitting: Adult Health

## 2019-11-19 ENCOUNTER — Other Ambulatory Visit: Payer: Self-pay

## 2019-11-19 ENCOUNTER — Ambulatory Visit (INDEPENDENT_AMBULATORY_CARE_PROVIDER_SITE_OTHER): Payer: 59 | Admitting: Adult Health

## 2019-11-19 ENCOUNTER — Encounter: Payer: Self-pay | Admitting: Adult Health

## 2019-11-19 VITALS — BP 104/67 | HR 61 | Temp 97.8°F | Resp 15 | Ht 59.0 in | Wt 190.4 lb

## 2019-11-19 DIAGNOSIS — E739 Lactose intolerance, unspecified: Secondary | ICD-10-CM

## 2019-11-19 DIAGNOSIS — Z1389 Encounter for screening for other disorder: Secondary | ICD-10-CM | POA: Diagnosis not present

## 2019-11-19 DIAGNOSIS — E559 Vitamin D deficiency, unspecified: Secondary | ICD-10-CM | POA: Diagnosis not present

## 2019-11-19 DIAGNOSIS — R5383 Other fatigue: Secondary | ICD-10-CM

## 2019-11-19 DIAGNOSIS — K9041 Non-celiac gluten sensitivity: Secondary | ICD-10-CM

## 2019-11-19 DIAGNOSIS — Z6838 Body mass index (BMI) 38.0-38.9, adult: Secondary | ICD-10-CM

## 2019-11-19 DIAGNOSIS — Z1159 Encounter for screening for other viral diseases: Secondary | ICD-10-CM

## 2019-11-19 LAB — POCT URINALYSIS DIPSTICK
Bilirubin, UA: NEGATIVE
Blood, UA: NEGATIVE
Glucose, UA: NEGATIVE
Ketones, UA: NEGATIVE
Leukocytes, UA: NEGATIVE
Nitrite, UA: NEGATIVE
Protein, UA: NEGATIVE
Spec Grav, UA: 1.01 (ref 1.010–1.025)
Urobilinogen, UA: 0.2 E.U./dL
pH, UA: 7 (ref 5.0–8.0)

## 2019-11-19 MED ORDER — OMEPRAZOLE MAGNESIUM 20 MG PO TBEC
20.0000 mg | DELAYED_RELEASE_TABLET | Freq: Every day | ORAL | 2 refills | Status: DC
Start: 2019-11-19 — End: 2020-02-22

## 2019-11-19 MED ORDER — METAMUCIL SMOOTH TEXTURE 58.6 % PO POWD: 1.0000 | Freq: Every day | ORAL | 12 refills | Status: DC

## 2019-11-19 NOTE — Patient Instructions (Addendum)
Health Maintenance, Female Adopting a healthy lifestyle and getting preventive care are important in promoting health and wellness. Ask your health care provider about:  The right schedule for you to have regular tests and exams.  Things you can do on your own to prevent diseases and keep yourself healthy. What should I know about diet, weight, and exercise? Eat a healthy diet   Eat a diet that includes plenty of vegetables, fruits, low-fat dairy products, and lean protein.  Do not eat a lot of foods that are high in solid fats, added sugars, or sodium. Maintain a healthy weight Body mass index (BMI) is used to identify weight problems. It estimates body fat based on height and weight. Your health care provider can help determine your BMI and help you achieve or maintain a healthy weight. Get regular exercise Get regular exercise. This is one of the most important things you can do for your health. Most adults should:  Exercise for at least 150 minutes each week. The exercise should increase your heart rate and make you sweat (moderate-intensity exercise).  Do strengthening exercises at least twice a week. This is in addition to the moderate-intensity exercise.  Spend less time sitting. Even light physical activity can be beneficial. Watch cholesterol and blood lipids Have your blood tested for lipids and cholesterol at 25 years of age, then have this test every 5 years. Have your cholesterol levels checked more often if:  Your lipid or cholesterol levels are high.  You are older than 25 years of age.  You are at high risk for heart disease. What should I know about cancer screening? Depending on your health history and family history, you may need to have cancer screening at various ages. This may include screening for:  Breast cancer.  Cervical cancer.  Colorectal cancer.  Skin cancer.  Lung cancer. What should I know about heart disease, diabetes, and high blood  pressure? Blood pressure and heart disease  High blood pressure causes heart disease and increases the risk of stroke. This is more likely to develop in people who have high blood pressure readings, are of African descent, or are overweight.  Have your blood pressure checked: ? Every 3-5 years if you are 18-39 years of age. ? Every year if you are 40 years old or older. Diabetes Have regular diabetes screenings. This checks your fasting blood sugar level. Have the screening done:  Once every three years after age 40 if you are at a normal weight and have a low risk for diabetes.  More often and at a younger age if you are overweight or have a high risk for diabetes. What should I know about preventing infection? Hepatitis B If you have a higher risk for hepatitis B, you should be screened for this virus. Talk with your health care provider to find out if you are at risk for hepatitis B infection. Hepatitis C Testing is recommended for:  Everyone born from 1945 through 1965.  Anyone with known risk factors for hepatitis C. Sexually transmitted infections (STIs)  Get screened for STIs, including gonorrhea and chlamydia, if: ? You are sexually active and are younger than 24 years of age. ? You are older than 24 years of age and your health care provider tells you that you are at risk for this type of infection. ? Your sexual activity has changed since you were last screened, and you are at increased risk for chlamydia or gonorrhea. Ask your health care provider if   you are at risk.  Ask your health care provider about whether you are at high risk for HIV. Your health care provider may recommend a prescription medicine to help prevent HIV infection. If you choose to take medicine to prevent HIV, you should first get tested for HIV. You should then be tested every 3 months for as long as you are taking the medicine. Pregnancy  If you are about to stop having your period (premenopausal) and  you may become pregnant, seek counseling before you get pregnant.  Take 400 to 800 micrograms (mcg) of folic acid every day if you become pregnant.  Ask for birth control (contraception) if you want to prevent pregnancy. Osteoporosis and menopause Osteoporosis is a disease in which the bones lose minerals and strength with aging. This can result in bone fractures. If you are 65 years old or older, or if you are at risk for osteoporosis and fractures, ask your health care provider if you should:  Be screened for bone loss.  Take a calcium or vitamin D supplement to lower your risk of fractures.  Be given hormone replacement therapy (HRT) to treat symptoms of menopause. Follow these instructions at home: Lifestyle  Do not use any products that contain nicotine or tobacco, such as cigarettes, e-cigarettes, and chewing tobacco. If you need help quitting, ask your health care provider.  Do not use street drugs.  Do not share needles.  Ask your health care provider for help if you need support or information about quitting drugs. Alcohol use  Do not drink alcohol if: ? Your health care provider tells you not to drink. ? You are pregnant, may be pregnant, or are planning to become pregnant.  If you drink alcohol: ? Limit how much you use to 0-1 drink a day. ? Limit intake if you are breastfeeding.  Be aware of how much alcohol is in your drink. In the [place] one drink equals one 12 oz bottle of beer (355 mL), one 5 oz glass of wine (148 mL), or one 1 oz glass of hard liquor (44 mL). General instructions  Schedule regular health, dental, and eye exams.  Stay current with your vaccines.  Tell your health care provider if: ? You often feel depressed. ? You have ever been abused or do not feel safe at home. Summary  Adopting a healthy lifestyle and getting preventive care are important in promoting health and wellness.  Follow your health care provider's instructions about healthy  diet, exercising, and getting tested or screened for diseases.  Follow your health care provider's instructions on monitoring your cholesterol and blood pressure. This information is not intended to replace advice given to you by your health care provider. Make sure you discuss any questions you have with your health care provider. Document Revised: 02/04/2018 Document Reviewed: 02/04/2018 Elsevier Patient Education  2020 [employer]   Calorie Counting for Weight Loss Calories are units of energy. Your body needs a certain amount of calories from food to keep you going throughout the day. When you eat more calories than your body needs, your body stores the extra calories as fat. When you eat fewer calories than your body needs, your body burns fat to get the energy it needs. Calorie counting means keeping track of how many calories you eat and drink each day. Calorie counting can be helpful if you need to lose weight. If you make sure to eat fewer calories than your body needs, you should lose weight. Ask your   health care provider what a healthy weight is for you. For calorie counting to work, you will need to eat the right number of calories in a day in order to lose a healthy amount of weight per week. A dietitian can help you determine how many calories you need in a day and will give you suggestions on how to reach your calorie goal.  A healthy amount of weight to lose per week is usually 1-2 lb (0.5-0.9 kg). This usually means that your daily calorie intake should be reduced by 500-750 calories.  Eating 1,200 - 1,500 calories per day can help most women lose weight.  Eating 1,500 - 1,800 calories per day can help most men lose weight. What is my plan? My goal is to have __________ calories per day. If I have this many calories per day, I should lose around __________ pounds per week. What do I need to know about calorie counting? In order to meet your daily calorie goal, you will need  to:  Find out how many calories are in each food you would like to eat. Try to do this before you eat.  Decide how much of the food you plan to eat.  Write down what you ate and how many calories it had. Doing this is called keeping a food log. To successfully lose weight, it is important to balance calorie counting with a healthy lifestyle that includes regular activity. Aim for 150 minutes of moderate exercise (such as walking) or 75 minutes of vigorous exercise (such as running) each week. Where do I find calorie information?  The number of calories in a food can be found on a Nutrition Facts label. If a food does not have a Nutrition Facts label, try to look up the calories online or ask your dietitian for help. Remember that calories are listed per serving. If you choose to have more than one serving of a food, you will have to multiply the calories per serving by the amount of servings you plan to eat. For example, the label on a package of bread might say that a serving size is 1 slice and that there are 90 calories in a serving. If you eat 1 slice, you will have eaten 90 calories. If you eat 2 slices, you will have eaten 180 calories. How do I keep a food log? Immediately after each meal, record the following information in your food log:  What you ate. Don't forget to include toppings, sauces, and other extras on the food.  How much you ate. This can be measured in cups, ounces, or number of items.  How many calories each food and drink had.  The total number of calories in the meal. Keep your food log near you, such as in a small notebook in your pocket, or use a mobile app or website. Some programs will calculate calories for you and show you how many calories you have left for the day to meet your goal. What are some calorie counting tips?   Use your calories on foods and drinks that will fill you up and not leave you hungry: ? Some examples of foods that fill you up are nuts  and nut butters, vegetables, lean proteins, and high-fiber foods like whole grains. High-fiber foods are foods with more than 5 g fiber per serving. ? Drinks such as sodas, specialty coffee drinks, alcohol, and juices have a lot of calories, yet do not fill you up.  Eat nutritious foods and   avoid empty calories. Empty calories are calories you get from foods or beverages that do not have many vitamins or protein, such as candy, sweets, and soda. It is better to have a nutritious high-calorie food (such as an avocado) than a food with few nutrients (such as a bag of chips).  Know how many calories are in the foods you eat most often. This will help you calculate calorie counts faster.  Pay attention to calories in drinks. Low-calorie drinks include water and unsweetened drinks.  Pay attention to nutrition labels for "low fat" or "fat free" foods. These foods sometimes have the same amount of calories or more calories than the full fat versions. They also often have added sugar, starch, or salt, to make up for flavor that was removed with the fat.  Find a way of tracking calories that works for you. Get creative. Try different apps or programs if writing down calories does not work for you. What are some portion control tips?  Know how many calories are in a serving. This will help you know how many servings of a certain food you can have.  Use a measuring cup to measure serving sizes. You could also try weighing out portions on a kitchen scale. With time, you will be able to estimate serving sizes for some foods.  Take some time to put servings of different foods on your favorite plates, bowls, and cups so you know what a serving looks like.  Try not to eat straight from a bag or box. Doing this can lead to overeating. Put the amount you would like to eat in a cup or on a plate to make sure you are eating the right portion.  Use smaller plates, glasses, and bowls to prevent overeating.  Try  not to multitask (for example, watch TV or use your computer) while eating. If it is time to eat, sit down at a table and enjoy your food. This will help you to know when you are full. It will also help you to be aware of what you are eating and how much you are eating. What are tips for following this plan? Reading food labels  Check the calorie count compared to the serving size. The serving size may be smaller than what you are used to eating.  Check the source of the calories. Make sure the food you are eating is high in vitamins and protein and low in saturated and trans fats. Shopping  Read nutrition labels while you shop. This will help you make healthy decisions before you decide to purchase your food.  Make a grocery list and stick to it. Cooking  Try to cook your favorite foods in a healthier way. For example, try baking instead of frying.  Use low-fat dairy products. Meal planning  Use more fruits and vegetables. Half of your plate should be fruits and vegetables.  Include lean proteins like poultry and fish. How do I count calories when eating out?  Ask for smaller portion sizes.  Consider sharing an entree and sides instead of getting your own entree.  If you get your own entree, eat only half. Ask for a box at the beginning of your meal and put the rest of your entree in it so you are not tempted to eat it.  If calories are listed on the menu, choose the lower calorie options.  Choose dishes that include vegetables, fruits, whole grains, low-fat dairy products, and lean protein.  Choose items that are   boiled, broiled, grilled, or steamed. Stay away from items that are buttered, battered, fried, or served with cream sauce. Items labeled "crispy" are usually fried, unless stated otherwise.  Choose water, low-fat milk, unsweetened iced tea, or other drinks without added sugar. If you want an alcoholic beverage, choose a lower calorie option such as a glass of wine or  light beer.  Ask for dressings, sauces, and syrups on the side. These are usually high in calories, so you should limit the amount you eat.  If you want a salad, choose a garden salad and ask for grilled meats. Avoid extra toppings like bacon, cheese, or fried items. Ask for the dressing on the side, or ask for olive oil and vinegar or lemon to use as dressing.  Estimate how many servings of a food you are given. For example, a serving of cooked rice is  cup or about the size of half a baseball. Knowing serving sizes will help you be aware of how much food you are eating at restaurants. The list below tells you how big or small some common portion sizes are based on everyday objects: ? 1 oz--4 stacked dice. ? 3 oz--1 deck of cards. ? 1 tsp--1 die. ? 1 Tbsp-- a ping-pong ball. ? 2 Tbsp--1 ping-pong ball. ?  cup-- baseball. ? 1 cup--1 baseball. Summary  Calorie counting means keeping track of how many calories you eat and drink each day. If you eat fewer calories than your body needs, you should lose weight.  A healthy amount of weight to lose per week is usually 1-2 lb (0.5-0.9 kg). This usually means reducing your daily calorie intake by 500-750 calories.  The number of calories in a food can be found on a Nutrition Facts label. If a food does not have a Nutrition Facts label, try to look up the calories online or ask your dietitian for help.  Use your calories on foods and drinks that will fill you up, and not on foods and drinks that will leave you hungry.  Use smaller plates, glasses, and bowls to prevent overeating. This information is not intended to replace advice given to you by your health care provider. Make sure you discuss any questions you have with your health care provider. Document Revised: 10/31/2017 Document Reviewed: 01/12/2016 Elsevier Patient Education  2020 [employer]   Fat and Cholesterol Restricted Eating Plan Getting too much fat and cholesterol in your  diet may cause health problems. Choosing the right foods helps keep your fat and cholesterol at normal levels. This can keep you from getting certain diseases. Your doctor may recommend an eating plan that includes:  Total fat: ______% or less of total calories a day.  Saturated fat: ______% or less of total calories a day.  Cholesterol: less than _________mg a day.  Fiber: ______g a day. What are tips for following this plan? Meal planning  At meals, divide your plate into four equal parts: ? Fill one-half of your plate with vegetables and green salads. ? Fill one-fourth of your plate with whole grains. ? Fill one-fourth of your plate with low-fat (lean) protein foods.  Eat fish that is high in omega-3 fats at least two times a week. This includes mackerel, tuna, sardines, and salmon.  Eat foods that are high in fiber, such as whole grains, beans, apples, broccoli, carrots, peas, and barley. General tips   Work with your doctor to lose weight if you need to.  Avoid: ? Foods with added sugar. ?  Fried foods. ? Foods with partially hydrogenated oils.  Limit alcohol intake to no more than 1 drink a day for nonpregnant women and 2 drinks a day for men. One drink equals 12 oz of beer, 5 oz of wine, or 1 oz of hard liquor. Reading food labels  Check food labels for: ? Trans fats. ? Partially hydrogenated oils. ? Saturated fat (g) in each serving. ? Cholesterol (mg) in each serving. ? Fiber (g) in each serving.  Choose foods with healthy fats, such as: ? Monounsaturated fats. ? Polyunsaturated fats. ? Omega-3 fats.  Choose grain products that have whole grains. Look for the word "whole" as the first word in the ingredient list. Cooking  Cook foods using low-fat methods. These include baking, boiling, grilling, and broiling.  Eat more home-cooked foods. Eat at restaurants and buffets less often.  Avoid cooking using saturated fats, such as butter, cream, palm oil, palm  kernel oil, and coconut oil. Recommended foods  Fruits  All fresh, canned (in natural juice), or frozen fruits. Vegetables  Fresh or frozen vegetables (raw, steamed, roasted, or grilled). Green salads. Grains  Whole grains, such as whole wheat or whole grain breads, crackers, cereals, and pasta. Unsweetened oatmeal, bulgur, barley, quinoa, or brown rice. Corn or whole wheat flour tortillas. Meats and other protein foods  Ground beef (85% or leaner), grass-fed beef, or beef trimmed of fat. Skinless chicken or [place]. Ground chicken or [place]. Pork trimmed of fat. All fish and seafood. Egg whites. Dried beans, peas, or lentils. Unsalted nuts or seeds. Unsalted canned beans. Nut butters without added sugar or oil. Dairy  Low-fat or nonfat dairy products, such as skim or 1% milk, 2% or reduced-fat cheeses, low-fat and fat-free ricotta or cottage cheese, or plain low-fat and nonfat yogurt. Fats and oils  Tub margarine without trans fats. Light or reduced-fat mayonnaise and salad dressings. Avocado. Olive, canola, sesame, or safflower oils. The items listed above may not be a complete list of foods and beverages you can eat. Contact a dietitian for more information. Foods to avoid Fruits  Canned fruit in heavy syrup. Fruit in cream or butter sauce. Fried fruit. Vegetables  Vegetables cooked in cheese, cream, or butter sauce. Fried vegetables. Grains  White bread. White pasta. White rice. Cornbread. Bagels, pastries, and croissants. Crackers and snack foods that contain trans fat and hydrogenated oils. Meats and other protein foods  Fatty cuts of meat. Ribs, chicken wings, bacon, sausage, bologna, salami, chitterlings, fatback, hot dogs, bratwurst, and packaged lunch meats. Liver and organ meats. Whole eggs and egg yolks. Chicken and [place] with skin. Fried meat. Dairy  Whole or 2% milk, cream, half-and-half, and cream cheese. Whole milk cheeses. Whole-fat or sweetened yogurt. Full-fat  cheeses. Nondairy creamers and whipped toppings. Processed cheese, cheese spreads, and cheese curds. Beverages  Alcohol. Sugar-sweetened drinks such as sodas, lemonade, and fruit drinks. Fats and oils  Butter, stick margarine, lard, shortening, ghee, or bacon fat. Coconut, palm kernel, and palm oils. Sweets and desserts  Corn syrup, sugars, honey, and molasses. Candy. Jam and jelly. Syrup. Sweetened cereals. Cookies, pies, cakes, donuts, muffins, and ice cream. The items listed above may not be a complete list of foods and beverages you should avoid. Contact a dietitian for more information. Summary  Choosing the right foods helps keep your fat and cholesterol at normal levels. This can keep you from getting certain diseases.  At meals, fill one-half of your plate with vegetables and green salads.  Eat high-fiber foods,  like whole grains, beans, apples, carrots, peas, and barley.  Limit added sugar, saturated fats, alcohol, and fried foods. This information is not intended to replace advice given to you by your health care provider. Make sure you discuss any questions you have with your health care provider. Document Revised: 10/15/2017 Document Reviewed: 10/29/2016 Elsevier Patient Education  2020 [employer] Psyllium granules or powder for solution What is this medicine? PSYLLIUM (SIL i yum) is a bulk-forming fiber laxative. This medicine is used to treat constipation. Increasing fiber in the diet may also help lower cholesterol and promote heart health for some people. This medicine may be used for other purposes; ask your health care provider or pharmacist if you have questions. COMMON BRAND NAME(S): Fiber Therapy, GenFiber, Geri-Mucil, Hydrocil, Konsyl, Metamucil, Metamucil MultiHealth, Mucilin, Natural Fiber Therapy, Reguloid What should I tell my health care provider before I take this medicine? They need to know if you have any of these conditions:  blockage in your  bowel  difficulty swallowing  inflammatory bowel disease  phenylketonuria  stomach or intestine problems  sudden change in bowel habits lasting more than 2 weeks  an unusual or allergic reaction to psyllium, other medicines, dyes, or preservatives  pregnant or trying or get pregnant  breast-feeding How should I use this medicine? Mix this medicine into a full glass (240 mL) of water or other cool drink. Take this medicine by mouth. Follow the directions on the package labeling, or take as directed by your health care professional. Take your medicine at regular intervals. Do not take your medicine more often than directed. Talk to your pediatrician regarding the use of this medicine in children. While this drug may be prescribed for children as young as 25 years old for selected conditions, precautions do apply. Overdosage: If you think you have taken too much of this medicine contact a poison control center or emergency room at once. NOTE: This medicine is only for you. Do not share this medicine with others. What if I miss a dose? If you miss a dose, take it as soon as you can. If it is almost time for your next dose, take only that dose. Do not take double or extra doses. What may interact with this medicine? Interactions are not expected. Take this product at least 2 hours before or after other medicines. This list may not describe all possible interactions. Give your health care provider a list of all the medicines, herbs, non-prescription drugs, or dietary supplements you use. Also tell them if you smoke, drink alcohol, or use illegal drugs. Some items may interact with your medicine. What should I watch for while using this medicine? Check with your doctor or health care professional if your symptoms do not start to get better or if they get worse. Stop using this medicine and contact your doctor or health care professional if you have rectal bleeding or if you have to treat your  constipation for more than 1 week. These could be signs of a more serious condition. Drink several glasses of water a day while you are taking this medicine. This will help to relieve constipation and prevent dehydration. What side effects may I notice from receiving this medicine? Side effects that you should report to your doctor or health care professional as soon as possible:  allergic reactions like skin rash, itching or hives, swelling of the face, lips, or tongue  breathing problems  chest pain  nausea, vomiting  rectal bleeding  trouble swallowing Side  effects that usually do not require medical attention (report to your doctor or health care professional if they continue or are bothersome):  bloating  gas  stomach cramps This list may not describe all possible side effects. Call your doctor for medical advice about side effects. You may report side effects to FDA at 1-800-FDA-1088. Where should I keep my medicine? Keep out of the reach of children. Store at room temperature between 15 and 30 degrees C (59 and 86 degrees F). Protect from moisture. Throw away any unused medicine after the expiration date. NOTE: This sheet is a summary. It may not cover all possible information. If you have questions about this medicine, talk to your doctor, pharmacist, or health care provider.  2020 Elsevier/Gold Standard (2017-07-08 15:41:08)

## 2019-11-19 NOTE — Progress Notes (Signed)
New patient visit   Patient: Courtney Spencer   DOB: 1995-12-15   24 y.o. Female  MRN: [id number] Visit Date: 11/19/2019  Today's healthcare provider: Jairo Ben, FNP   Chief Complaint  Patient presents with   New Patient (Initial Visit)   Subjective    Courtney Spencer is a 24 y.o. female who presents today as a new patient to establish care.  HPI  Patient reports that she feels well today. Patient states that she follows a well balanced diet but is not actively exercising, she states her sleep patterns are fairly well but states that she does deal with symptom of fatigue.   Gluten - she has issues with eating bread feels bloated. Causes some mild diarrhea. Denies any rectal bleeding or dark color stools. She feels she has lactose intolerance and possibly gluten. Has seen Gastrointestinal MD in past and may want to again but declined at this time.   Patient reports that she has a scheduled pap smear examination with [facility] in October, she denies any past history of abnormal pap.  She has appointment for PAP on 12/10/2019- she has painful intercourse at times, has had ultrasound. She is married.   Patient  denies any fever, body aches,chills, rash, chest pain, shortness of breath, nausea, vomiting, or diarrhea.  Denies dizziness, lightheadedness, pre syncopal or syncopal episodes.   Patient's last menstrual period was 10/23/2019 (exact date).    Past Medical History:  Diagnosis Date   Allergy    Anxiety    Cat scratch fever    Depression    GERD (gastroesophageal reflux disease)    Heart murmur of newborn    Helicobacter pylori ab+    H.pylori serology positive in Jan 2015, treated with Amoxicillin and Flagyl at that time. Returned to clinic Feb 2015 and had rechecked, with a different provider treating with Amoxicillin and Biaxin. H.pylori stool antigen ordered April 2015 and negative.   Hematuria 08/09/2013   History of  kidney stones    Kidney stones 10/26/2013   Migraines    Other and unspecified ovarian cyst 08/09/2013   Ovarian cyst    UTI (lower urinary tract infection)    Past Surgical History:  Procedure Laterality Date   CHOLECYSTECTOMY N/A 05/12/2015   Procedure: LAPAROSCOPIC CHOLECYSTECTOMY;  Surgeon: Franky Macho, MD;  Location: [facility];  Service: General;  Laterality: N/A;   ESOPHAGOGASTRODUODENOSCOPY N/A 08/13/2013   Dr. Darrick Penna: nodular gastritis, negative H.pylori   None     WISDOM TOOTH EXTRACTION     Family Status  Relation Name Status   Mother  Alive   Father  Alive   Sister  Alive   Brother  Alive   MGM  Alive   MGF  Deceased   PGM  Alive   PGF  Alive   Sister  Alive   Sister  Alive   Sister  Alive   Brother  Alive   Other  (Not Specified)   Family History  Problem Relation Age of Onset   Gallbladder disease Mother        gallbladder was removed   Migraines Mother    Migraines Sister    Hypertension Sister    ADD / ADHD Brother    Hypertension Maternal Grandmother    Cancer Maternal Grandfather        lung   Cancer Paternal Grandfather        lung   Seizures Sister    Scoliosis Sister  ADD / ADHD Brother    Colon cancer Other        unknown   Social History   Socioeconomic History   Marital status: Married    Spouse name: Not on file   Number of children: Not on file   Years of education: Not on file   Highest education level: Not on file  Occupational History   Occupation: student    Comment: Early Childhood Development, 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   Tobacco Use   Smoking status: Never Smoker   Smokeless tobacco: Never Used   Tobacco comment: Never really smoked  Vaping Use   Vaping Use: Never used  Substance and Sexual Activity   Alcohol use: No    Alcohol/week: 0.0 standard drinks   Drug use: No   Sexual activity: Yes    Birth control/protection: None  Other Topics  Concern   Not on file  Social History Narrative   Not on file   Social Determinants of Health   Financial Resource Strain:    Difficulty of Paying Living Expenses: Not on file  Food Insecurity:    Worried About Programme researcher, broadcasting/film/videounning Out of Food in the Last Year: Not on file   [employer] Out of Food in the Last Year: Not on file  Transportation Needs:    Lack of Transportation (Medical): Not on file   Lack of Transportation (Non-Medical): Not on file  Physical Activity:    Days of Exercise per Week: Not on file   Minutes of Exercise per Session: Not on file  Stress:    Feeling of Stress : Not on file  Social Connections:    Frequency of Communication with Friends and Family: Not on file   Frequency of Social Gatherings with Friends and Family: Not on file   Attends Religious Services: Not on file   Active Member of Clubs or Organizations: Not on file   Attends BankerClub or Organization Meetings: Not on file   Marital Status: Not on file   Outpatient Medications Prior to Visit  Medication Sig   norethindrone (MICRONOR) 0.35 MG tablet Take 1 tablet (0.35 mg total) by mouth daily.   [DISCONTINUED] omeprazole (PRILOSEC OTC) 20 MG tablet Take 20 mg by mouth daily.   No facility-administered medications prior to visit.   No Known Allergies   There is no immunization history on file for this patient.  Health Maintenance  Topic Date Due   PAP SMEAR-Modifier  08/20/2019   COVID-19 Vaccine (1) 12/05/2019 (Originally 08/06/2006)   INFLUENZA VACCINE  05/25/2020 (Originally 09/26/2019)   PAP-Cervical Cytology Screening  11/18/2020 (Originally 08/20/2019)   TETANUS/TDAP  03/28/2027   Hepatitis C Screening  Completed   HIV Screening  Completed    Patient Care Team: Berniece PapFlinchum, Monte Bronder S, FNP as PCP - General (Family Medicine) Fields, Darleene CleaverSandi L, MD (Inactive) as Consulting Physician (Gastroenterology)  Review of Systems  Constitutional: Positive for fatigue.  Gastrointestinal:  Positive for abdominal pain.  Neurological: Positive for headaches.  All other systems reviewed and are negative.     Objective    BP 104/67    Pulse 61    Temp 97.8 F (36.6 C) (Oral)    Resp 15    Ht 4\' 11"  (1.499 m)    Wt 190 lb 6.4 oz (86.4 kg)    LMP 10/23/2019 (Exact Date)    SpO2 100%    BMI 38.46 kg/m  Physical Exam Vitals reviewed.  Constitutional:  General: She is not in acute distress.    Appearance: She is well-developed. She is obese. She is not diaphoretic.     Interventions: She is not intubated. HENT:     Head: Normocephalic and atraumatic.     Right Ear: External ear normal. There is no impacted cerumen.     Left Ear: External ear normal. There is no impacted cerumen.     Nose: Nose normal. No congestion or rhinorrhea.     Mouth/Throat:     Mouth: Mucous membranes are moist.     Pharynx: No oropharyngeal exudate.  Eyes:     General: Lids are normal. No scleral icterus.       Right eye: No discharge.        Left eye: No discharge.     Extraocular Movements: Extraocular movements intact.     Conjunctiva/sclera: Conjunctivae normal.     Right eye: Right conjunctiva is not injected. No exudate or hemorrhage.    Left eye: Left conjunctiva is not injected. No exudate or hemorrhage.    Pupils: Pupils are equal, round, and reactive to light.  Neck:     Thyroid: No thyroid mass or thyromegaly.     Vascular: Normal carotid pulses. No carotid bruit, hepatojugular reflux or JVD.     Trachea: Trachea and phonation normal. No tracheal tenderness or tracheal deviation.     Meningeal: Brudzinski's sign and Kernig's sign absent.  Cardiovascular:     Rate and Rhythm: Normal rate and regular rhythm.     Pulses: Normal pulses.          Radial pulses are 2+ on the right side and 2+ on the left side.       Dorsalis pedis pulses are 2+ on the right side and 2+ on the left side.       Posterior tibial pulses are 2+ on the right side and 2+ on the left side.     Heart sounds:  Normal heart sounds, S1 normal and S2 normal. Heart sounds not distant. No murmur heard.  No friction rub. No gallop.   Pulmonary:     Effort: Pulmonary effort is normal. No tachypnea, bradypnea, accessory muscle usage or respiratory distress. She is not intubated.     Breath sounds: Normal breath sounds. No stridor. No wheezing or rales.  Chest:     Chest wall: No tenderness.  Abdominal:     General: Bowel sounds are normal. There is no distension or abdominal bruit.     Palpations: Abdomen is soft. There is no shifting dullness, fluid wave, hepatomegaly, splenomegaly, mass or pulsatile mass.     Tenderness: There is no abdominal tenderness. There is no right CVA tenderness, left CVA tenderness, guarding or rebound.     Hernia: No hernia is present.  Musculoskeletal:        General: No swelling, tenderness, deformity or signs of injury. Normal range of motion.     Cervical back: Full passive range of motion without pain, normal range of motion and neck supple. No edema, erythema or rigidity. No spinous process tenderness or muscular tenderness. Normal range of motion.  Lymphadenopathy:     Head:     Right side of head: No submental, submandibular, tonsillar, preauricular, posterior auricular or occipital adenopathy.     Left side of head: No submental, submandibular, tonsillar, preauricular, posterior auricular or occipital adenopathy.     Cervical: No cervical adenopathy.     Right cervical: No superficial, deep or posterior cervical adenopathy.  Left cervical: No superficial, deep or posterior cervical adenopathy.     Upper Body:     Right upper body: No supraclavicular or pectoral adenopathy.     Left upper body: No supraclavicular or pectoral adenopathy.  Skin:    General: Skin is warm and dry.     Capillary Refill: Capillary refill takes less than 2 seconds.     Coloration: Skin is not jaundiced or pale.     Findings: No abrasion, bruising, burn, ecchymosis, erythema, lesion,  petechiae or rash.     Nails: There is no clubbing.  Neurological:     General: No focal deficit present.     Mental Status: She is alert and oriented to person, place, and time.     GCS: GCS eye subscore is 4. GCS verbal subscore is 5. GCS motor subscore is 6.     Cranial Nerves: No cranial nerve deficit.     Sensory: No sensory deficit.     Motor: No weakness, tremor, atrophy, abnormal muscle tone or seizure activity.     Coordination: Coordination normal.     Gait: Gait normal.     Deep Tendon Reflexes: Reflexes are normal and symmetric. Reflexes normal. Babinski sign absent on the right side. Babinski sign absent on the left side.     Reflex Scores:      Tricep reflexes are 2+ on the right side and 2+ on the left side.      Bicep reflexes are 2+ on the right side and 2+ on the left side.      Brachioradialis reflexes are 2+ on the right side and 2+ on the left side.      Patellar reflexes are 2+ on the right side and 2+ on the left side.      Achilles reflexes are 2+ on the right side and 2+ on the left side. Psychiatric:        Mood and Affect: Mood normal.        Speech: Speech normal.        Behavior: Behavior normal.        Thought Content: Thought content normal.        Judgment: Judgment normal.      Depression Screen PHQ 2/9 Scores 11/19/2019 08/19/2016  PHQ - 2 Score 3 0  PHQ- 9 Score 10 -   Results for orders placed or performed in visit on 11/19/19  POCT urinalysis dipstick  Result Value Ref Range   Color, UA yellow    Clarity, UA clear    Glucose, UA Negative Negative   Bilirubin, UA negative    Ketones, UA negative    Spec Grav, UA 1.010 1.010 - 1.025   Blood, UA negative    pH, UA 7.0 5.0 - 8.0   Protein, UA Negative Negative   Urobilinogen, UA 0.2 0.2 or 1.0 E.U./dL   Nitrite, UA negative    Leukocytes, UA Negative Negative   Appearance     Odor      Assessment & Plan       Screening for blood or protein in urine - Plan: POCT urinalysis  dipstick  Gluten intolerance - Plan: Celiac Disease Panel  Vitamin D insufficiency - Plan: VITAMIN D 25 Hydroxy (Vit-D Deficiency, Fractures)  Body mass index (BMI) of 38.0-38.9 in adult  Need for hepatitis C screening test - Plan: Hepatitis C Antibody  Fatigue, unspecified type - Plan: CBC with Differential/Platelet, Lipid panel, Comprehensive metabolic panel, TSH, Hepatitis C Antibody, Hepatitis panel,  acute  Lactose intolerance - Plan: Lactose tolerance test  Meds ordered this encounter  Medications   omeprazole (PRILOSEC OTC) 20 MG tablet    Sig: Take 1 tablet (20 mg total) by mouth daily.    Dispense:  90 tablet    Refill:  2   psyllium (METAMUCIL SMOOTH TEXTURE) 58.6 % powder    Sig: Take 1 packet by mouth daily. Mix with 8 ounce of water or juice. Drink nightly    Dispense:  283 g    Refill:  12   Orders Placed This Encounter  Procedures   CBC with Differential/Platelet   Lipid panel   Comprehensive metabolic panel   TSH   Celiac Disease Panel   VITAMIN D 25 Hydroxy (Vit-D Deficiency, Fractures)   Hepatitis C Antibody   Hepatitis panel, acute   Lactose tolerance test   POCT urinalysis dipstick    Return in about 3 months (around 02/18/2020), or if symptoms worsen or fail to improve, for at any time for any worsening symptoms, Go to Emergency room/ urgent care if worse.     Let office know if you decide you would like GI refferal - ok to place.   Red Flags discussed. The patient was given clear instructions to go to ER or return to medical center if any red flags develop, symptoms do not improve, worsen or new problems develop. They verbalized understanding.  Advised patient call the office or your primary care doctor for an appointment if no improvement within 72 hours or if any symptoms change or worsen at any time  Advised ER or urgent Care if after hours or on weekend. Call 911 for emergency symptoms at any time.Patinet verbalized understanding of  all instructions given/reviewed and treatment plan and has no further questions or concerns at this time.       Jairo Ben, FNP  [facility] [phone] (phone) [phone] (fax)  [facility]

## 2019-11-21 ENCOUNTER — Other Ambulatory Visit: Payer: Self-pay | Admitting: Adult Health

## 2019-11-21 DIAGNOSIS — E559 Vitamin D deficiency, unspecified: Secondary | ICD-10-CM

## 2019-11-21 MED ORDER — VITAMIN D (ERGOCALCIFEROL) 1.25 MG (50000 UNIT) PO CAPS: 50000.0000 [IU] | ORAL_CAPSULE | ORAL | 0 refills | Status: DC

## 2019-11-21 NOTE — Progress Notes (Signed)
Labs within normal limits except Vitamin D level is low, will need recheck after completion of prescription vitamin D - take Vitamin D at 50,000 international units by mouth once every 7 days for 10 weeks, and then recheck vitamin D level and TSH in 3 months. Celiac lab is normal so far, however not all has resulted yet.   Sent vitamin d to pharmacy.

## 2019-11-21 NOTE — Progress Notes (Unsigned)
Meds ordered this encounter  Medications  . Vitamin D, Ergocalciferol, (DRISDOL) 1.25 MG (50000 UNIT) CAPS capsule    Sig: Take 1 capsule (50,000 Units total) by mouth every 7 (seven) days. (taking one tablet per week)    Dispense:  10 capsule    Refill:  0    Orders Placed This Encounter  Procedures  . VITAMIN D 25 Hydroxy (Vit-D Deficiency, Fractures)

## 2019-11-23 LAB — COMPREHENSIVE METABOLIC PANEL
ALT: 31 IU/L (ref 0–32)
AST: 24 IU/L (ref 0–40)
Albumin/Globulin Ratio: 1.6 (ref 1.2–2.2)
Albumin: 4.5 g/dL (ref 3.9–5.0)
Alkaline Phosphatase: 66 IU/L (ref 44–121)
BUN/Creatinine Ratio: 14 (ref 9–23)
BUN: 10 mg/dL (ref 6–20)
Bilirubin Total: 0.4 mg/dL (ref 0.0–1.2)
CO2: 20 mmol/L (ref 20–29)
Calcium: 9.3 mg/dL (ref 8.7–10.2)
Chloride: 102 mmol/L (ref 96–106)
Creatinine, Ser: 0.72 mg/dL (ref 0.57–1.00)
GFR calc Af Amer: 135 mL/min/{1.73_m2} (ref >59)
GFR calc non Af Amer: 117 mL/min/{1.73_m2} (ref >59)
Globulin, Total: 2.8 g/dL (ref 1.5–4.5)
Glucose: 87 mg/dL (ref 65–99)
Potassium: 4.5 mmol/L (ref 3.5–5.2)
Sodium: 137 mmol/L (ref 134–144)
Total Protein: 7.3 g/dL (ref 6.0–8.5)

## 2019-11-23 LAB — CBC WITH DIFFERENTIAL/PLATELET
Basophils Absolute: 0 10*3/uL (ref 0.0–0.2)
Basos: 1 %
EOS (ABSOLUTE): 0.1 10*3/uL (ref 0.0–0.4)
Eos: 1 %
Hematocrit: 35.8 % (ref 34.0–46.6)
Hemoglobin: 12.2 g/dL (ref 11.1–15.9)
Immature Grans (Abs): 0 10*3/uL (ref 0.0–0.1)
Immature Granulocytes: 0 %
Lymphocytes Absolute: 2.1 10*3/uL (ref 0.7–3.1)
Lymphs: 27 %
MCH: 27.7 pg (ref 26.6–33.0)
MCHC: 34.1 g/dL (ref 31.5–35.7)
MCV: 81 fL (ref 79–97)
Monocytes Absolute: 0.5 10*3/uL (ref 0.1–0.9)
Monocytes: 7 %
Neutrophils Absolute: 5.2 10*3/uL (ref 1.4–7.0)
Neutrophils: 64 %
Platelets: 347 10*3/uL (ref 150–450)
RBC: 4.4 x10E6/uL (ref 3.77–5.28)
RDW: 12.4 % (ref 11.7–15.4)
WBC: 7.9 10*3/uL (ref 3.4–10.8)

## 2019-11-23 LAB — LIPID PANEL
Chol/HDL Ratio: 2.5 ratio (ref 0.0–4.4)
Cholesterol, Total: 114 mg/dL (ref 100–199)
HDL: 46 mg/dL (ref >39)
LDL Chol Calc (NIH): 59 mg/dL (ref 0–99)
Triglycerides: 33 mg/dL (ref 0–149)
VLDL Cholesterol Cal: 9 mg/dL (ref 5–40)

## 2019-11-23 LAB — HEPATITIS PANEL, ACUTE
Hep A IgM: NEGATIVE
Hep B C IgM: NEGATIVE
Hep C Virus Ab: <0.1 s/co ratio (ref 0.0–0.9)
Hepatitis B Surface Ag: NEGATIVE

## 2019-11-23 LAB — VITAMIN D 25 HYDROXY (VIT D DEFICIENCY, FRACTURES): Vit D, 25-Hydroxy: 23.2 ng/mL — ABNORMAL LOW (ref 30.0–100.0)

## 2019-11-23 LAB — CELIAC DISEASE PANEL
Endomysial IgA: NEGATIVE
IgA/Immunoglobulin A, Serum: 272 mg/dL (ref 87–352)
Transglutaminase IgA: <2 U/mL (ref 0–3)

## 2019-11-23 LAB — TSH: TSH: 0.923 u[IU]/mL (ref 0.450–4.500)

## 2019-11-29 ENCOUNTER — Encounter: Payer: Self-pay | Admitting: Adult Health

## 2019-12-10 ENCOUNTER — Other Ambulatory Visit (HOSPITAL_COMMUNITY)
Admission: RE | Admit: 2019-12-10 | Discharge: 2019-12-10 | Disposition: A | Discharge status: Home / Self Care | Payer: 59 | Source: Ambulatory Visit | Attending: Adult Health | Admitting: Adult Health

## 2019-12-10 ENCOUNTER — Other Ambulatory Visit: Payer: Self-pay

## 2019-12-10 ENCOUNTER — Ambulatory Visit (INDEPENDENT_AMBULATORY_CARE_PROVIDER_SITE_OTHER): Payer: 59 | Admitting: Adult Health

## 2019-12-10 ENCOUNTER — Encounter: Payer: Self-pay | Admitting: Adult Health

## 2019-12-10 VITALS — BP 106/80 | HR 78 | Ht <= 58 in | Wt 187.0 lb

## 2019-12-10 DIAGNOSIS — N941 Unspecified dyspareunia: Secondary | ICD-10-CM

## 2019-12-10 DIAGNOSIS — Z113 Encounter for screening for infections with a predominantly sexual mode of transmission: Secondary | ICD-10-CM | POA: Diagnosis not present

## 2019-12-10 DIAGNOSIS — Z3009 Encounter for other general counseling and advice on contraception: Secondary | ICD-10-CM | POA: Insufficient documentation

## 2019-12-10 DIAGNOSIS — N926 Irregular menstruation, unspecified: Secondary | ICD-10-CM | POA: Insufficient documentation

## 2019-12-10 DIAGNOSIS — Z1151 Encounter for screening for human papillomavirus (HPV): Secondary | ICD-10-CM | POA: Diagnosis not present

## 2019-12-10 DIAGNOSIS — Z01419 Encounter for gynecological examination (general) (routine) without abnormal findings: Secondary | ICD-10-CM | POA: Diagnosis present

## 2019-12-10 DIAGNOSIS — N946 Dysmenorrhea, unspecified: Secondary | ICD-10-CM

## 2019-12-10 DIAGNOSIS — Z30011 Encounter for initial prescription of contraceptive pills: Secondary | ICD-10-CM | POA: Insufficient documentation

## 2019-12-10 MED ORDER — NORETHINDRONE 0.35 MG PO TABS: 1.0000 | ORAL_TABLET | Freq: Every day | ORAL | 11 refills | Status: DC

## 2019-12-10 NOTE — Progress Notes (Signed)
Patient ID: Courtney Spencer, female   DOB: 07-16-1995, 24 y.o.   MRN: [id number] History of Present Illness: Courtney Spencer is a 24 year old white female, married, G0P0 in for a well woman gyn exam and pap. She complains of pain with sex and painful periods, did not start POP yet. PCP is [facility]   Current Medications, Allergies, Past Medical History, Past Surgical History, Family History and Social History were reviewed in [employer] record.     Review of Systems: Patient denies any headaches, hearing loss, fatigue, blurred vision, shortness of breath, chest pain, abdominal pain, problems with bowel movements, urination. No joint pain or mood swings. See HPI for positives.   Physical Exam:BP 106/80 (BP Location: Left Arm, Patient Position: Sitting, Cuff Size: Normal)   Pulse 78   Ht 4\' 10"  (1.473 m)   Wt 187 lb (84.8 kg)   LMP 12/05/2019   BMI 39.08 kg/m  General:  Well developed, well nourished, no acute distress Skin:  Warm and dry Neck:  Midline trachea, normal thyroid, good ROM, no lymphadenopathy Lungs; Clear to auscultation bilaterally Breast:  No dominant palpable mass, retraction, or nipple discharge Cardiovascular: Regular rate and rhythm Abdomen:  Soft, non tender, no hepatosplenomegaly Pelvic:  External genitalia is normal in appearance, no lesions.  The vagina is normal in appearance. Urethra has no lesions or masses. The cervix is smooth, pap with GC/CHL and high risk HPV genotyping performed.02/04/2020  Uterus is felt to be normal size, shape, and contour. +tenderness noted. No adnexal masses or tenderness noted.Bladder is non tender, no masses felt. She says feels achy after exam. Extremities/musculoskeletal:  No swelling or varicosities noted, no clubbing or cyanosis Psych:  No mood changes, alert and cooperative,seems happy AA is 1 Fall risk is low PHQ 9 score is 6, no SI  Upstream - 12/10/19 1256      Pregnancy Intention  Screening   Does the patient want to become pregnant in the next year? Unsure    Does the patient's partner want to become pregnant in the next year? Unsure    Would the patient like to discuss contraceptive options today? No      Contraception Wrap Up   Current Method No Method - Other Reason    End Method Oral Contraceptive    Contraception Counseling Provided No         Examination chaperoned by 12/12/19 LPN.  Impression and Plan: 1. Encounter for gynecological examination with Papanicolaou smear of cervix Pap sent Physical in 1 year  Pap in 3 if normal  2. Family planning   3. Screening examination for STD (sexually transmitted disease) Check HIV and RPR and hepatitis C antibody   4. Dyspareunia in female  5. Dysmenorrhea Will start Micronor and may add Malachy Mood if does not want to try to get pregnant Follow up in 3 months   6. Encounter for initial prescription of contraceptive pills Start Micronor Sunday  Meds ordered this encounter  Medications  . norethindrone (MICRONOR) 0.35 MG tablet    Sig: Take 1 tablet (0.35 mg total) by mouth daily.    Dispense:  28 tablet    Refill:  11    Order Specific Question:   Supervising Provider    Answer:   Saturday, LUTHER H [[id number]]    7. Irregular periods Will try micronor

## 2019-12-11 LAB — RPR: RPR Ser Ql: NONREACTIVE

## 2019-12-11 LAB — HIV ANTIBODY (ROUTINE TESTING W REFLEX): HIV Screen 4th Generation wRfx: NONREACTIVE

## 2019-12-11 LAB — HEPATITIS C ANTIBODY: Hep C Virus Ab: <0.1 s/co ratio (ref 0.0–0.9)

## 2019-12-14 LAB — CYTOLOGY - PAP
Chlamydia: NEGATIVE
Comment: NEGATIVE
Comment: NEGATIVE
Comment: NORMAL
Diagnosis: NEGATIVE
High risk HPV: NEGATIVE
Neisseria Gonorrhea: NEGATIVE

## 2020-02-04 ENCOUNTER — Encounter: Payer: Self-pay | Admitting: Adult Health

## 2020-02-21 NOTE — Progress Notes (Addendum)
Established patient visit   Patient: Courtney Spencer   DOB: 09/13/1995   24 y.o. Female  MRN: [id number] Visit Date: 02/22/2020  Today's healthcare provider: Jairo Ben, FNP   Chief Complaint  Patient presents with  . Gastroesophageal Reflux   Subjective    HPI  GERD, Follow up:  The patient was last seen for GERD 3 months ago. Changes made since that visit include continued patient on Omeprazole 20mg .  She reports fair compliance with treatment. She is not having side effects. .  She IS experiencing fullness after meals, heartburn, midespigastric pain, nausea, need to clear throat frequently, regurgitation of undigested food and upper abdominal discomfort. She is NOT experiencing abdominal bloating, belching, belching and eructation, bilious reflux, chest pain, choking on food, cough, deep pressure at base of neck, difficulty swallowing, dysphagia, hematemesis, hoarseness, laryngitis, melena, nocturnal burning, odynophagia, shortness of breath, unexpected weight loss, waterbrash or wheezing  Taking Protonix 20mg  by mouth once daily.  Denies any choking.  Denies any bleeding.  Had lower abdominal pain.   No LMP recorded. (Menstrual status: Irregular Periods).  Gynecological exam within normal has follow up in 3 months with NP.  Patient  denies any fever, body aches,chills, rash, chest pain, shortness of breath, vomiting, or diarrhea.  Denies dizziness, lightheadedness, pre syncopal or syncopal episodes.      -----------------------------------------------------------------------------------------      Medications: Outpatient Medications Prior to Visit  Medication Sig  . norethindrone (MICRONOR) 0.35 MG tablet Take 1 tablet (0.35 mg total) by mouth daily.  . Vitamin D, Ergocalciferol, (DRISDOL) 1.25 MG (50000 UNIT) CAPS capsule Take 1 capsule (50,000 Units total) by mouth every 7 (seven) days. (taking one tablet per week)   . [DISCONTINUED] omeprazole (PRILOSEC OTC) 20 MG tablet Take 1 tablet (20 mg total) by mouth daily.   No facility-administered medications prior to visit.    Review of Systems  Constitutional: Negative.   HENT: Negative.   Eyes: Negative.   Respiratory: Negative.   Cardiovascular: Negative.   Gastrointestinal: Positive for abdominal distention and nausea. Negative for vomiting.  Genitourinary: Negative.   Musculoskeletal: Negative.   Skin: Negative.   Neurological: Negative.   Hematological: Negative.   Psychiatric/Behavioral: Negative.       Objective    BP 100/75   Pulse 69   Temp 97.7 F (36.5 C) (Oral)   Resp 16   Wt 193 lb 6.4 oz (87.7 kg)   BMI 40.42 kg/m  BP Readings from Last 3 Encounters:  02/22/20 100/75  12/10/19 106/80  11/19/19 104/67   Wt Readings from Last 3 Encounters:  02/22/20 193 lb 6.4 oz (87.7 kg)  12/10/19 187 lb (84.8 kg)  11/19/19 190 lb 6.4 oz (86.4 kg)      Physical Exam Vitals reviewed.  Constitutional:      General: She is not in acute distress.    Appearance: She is well-developed. She is obese. She is not ill-appearing, toxic-appearing or diaphoretic.     Interventions: She is not intubated.    Comments: Patient appers well, not sickly. Speaking in complete sentences. Patient moves on and off of exam table and in room without difficulty. Gait is normal in hall and in room. Patient is oriented to person place time and situation. Patient answers questions appropriately and engages eye contact and verbal dialect with provider.   HENT:     Head: Normocephalic and atraumatic.     Right Ear: Tympanic membrane, ear canal  and external ear normal. There is no impacted cerumen.     Left Ear: Tympanic membrane, ear canal and external ear normal. There is no impacted cerumen.     Nose: Nose normal. No congestion or rhinorrhea.     Mouth/Throat:     Mouth: Mucous membranes are moist.     Pharynx: No oropharyngeal exudate.  Eyes:      General: Lids are normal. No scleral icterus.       Right eye: No discharge.        Left eye: No discharge.     Conjunctiva/sclera: Conjunctivae normal.     Right eye: Right conjunctiva is not injected. No exudate or hemorrhage.    Left eye: Left conjunctiva is not injected. No exudate or hemorrhage.    Pupils: Pupils are equal, round, and reactive to light.  Neck:     Thyroid: No thyroid mass or thyromegaly.     Vascular: Normal carotid pulses. No carotid bruit, hepatojugular reflux or JVD.     Trachea: Trachea and phonation normal. No tracheal tenderness or tracheal deviation.     Meningeal: Brudzinski's sign and Kernig's sign absent.  Cardiovascular:     Rate and Rhythm: Normal rate and regular rhythm.     Pulses: Normal pulses.          Radial pulses are 2+ on the right side and 2+ on the left side.       Dorsalis pedis pulses are 2+ on the right side and 2+ on the left side.       Posterior tibial pulses are 2+ on the right side and 2+ on the left side.     Heart sounds: Normal heart sounds, S1 normal and S2 normal. Heart sounds not distant. No murmur heard. No friction rub. No gallop.   Pulmonary:     Effort: Pulmonary effort is normal. No tachypnea, bradypnea, accessory muscle usage or respiratory distress. She is not intubated.     Breath sounds: Normal breath sounds. No stridor. No wheezing, rhonchi or rales.  Chest:     Chest wall: No tenderness.  Breasts:     Right: No supraclavicular adenopathy.     Left: No supraclavicular adenopathy.    Abdominal:     General: Bowel sounds are normal. There is no distension or abdominal bruit.     Palpations: Abdomen is soft. There is no shifting dullness, fluid wave, hepatomegaly, splenomegaly, mass or pulsatile mass.     Tenderness: There is no abdominal tenderness. There is no right CVA tenderness, left CVA tenderness, guarding or rebound.     Hernia: No hernia is present.  Musculoskeletal:        General: No swelling, tenderness,  deformity or signs of injury. Normal range of motion.     Cervical back: Full passive range of motion without pain, normal range of motion and neck supple. No edema, erythema or rigidity. No spinous process tenderness or muscular tenderness. Normal range of motion.     Right lower leg: No edema.     Left lower leg: No edema.  Lymphadenopathy:     Head:     Right side of head: No submental, submandibular, tonsillar, preauricular, posterior auricular or occipital adenopathy.     Left side of head: No submental, submandibular, tonsillar, preauricular, posterior auricular or occipital adenopathy.     Cervical: No cervical adenopathy.     Right cervical: No superficial, deep or posterior cervical adenopathy.    Left cervical: No superficial, deep or  posterior cervical adenopathy.     Upper Body:     Right upper body: No supraclavicular or pectoral adenopathy.     Left upper body: No supraclavicular or pectoral adenopathy.  Skin:    General: Skin is warm and dry.     Coloration: Skin is not jaundiced or pale.     Findings: No abrasion, bruising, burn, ecchymosis, erythema, lesion, petechiae or rash.     Nails: There is no clubbing.  Neurological:     General: No focal deficit present.     Mental Status: She is alert and oriented to person, place, and time.     GCS: GCS eye subscore is 4. GCS verbal subscore is 5. GCS motor subscore is 6.     Cranial Nerves: No cranial nerve deficit.     Sensory: No sensory deficit.     Motor: No weakness, tremor, atrophy, abnormal muscle tone or seizure activity.     Coordination: Coordination normal.     Gait: Gait normal.     Deep Tendon Reflexes: Reflexes are normal and symmetric. Reflexes normal. Babinski sign absent on the right side. Babinski sign absent on the left side.     Reflex Scores:      Tricep reflexes are 2+ on the right side and 2+ on the left side.      Bicep reflexes are 2+ on the right side and 2+ on the left side.      Brachioradialis  reflexes are 2+ on the right side and 2+ on the left side.      Patellar reflexes are 2+ on the right side and 2+ on the left side.      Achilles reflexes are 2+ on the right side and 2+ on the left side. Psychiatric:        Mood and Affect: Mood normal.        Speech: Speech normal.        Behavior: Behavior normal.        Thought Content: Thought content normal.        Judgment: Judgment normal.      No results found for any visits on 02/22/20.  Assessment & Plan     Gastroesophageal reflux disease, unspecified whether esophagitis present - Plan: Ambulatory referral to Gastroenterology, H. pylori antigen, stool, CBC with Differential/Platelet, Comprehensive Metabolic Panel (CMET)  Vitamin D deficiency - Plan: VITAMIN D 25 Hydroxy (Vit-D Deficiency, Fractures)  Excessive sweating - Plan: Ambulatory referral to Dermatology, TSH  Generalized abdominal pain  Intertriginous candidiasis - Plan: nystatin cream (MYCOSTATIN)  Medications Discontinued During This Encounter  Medication Reason  . omeprazole (PRILOSEC OTC) 20 MG tablet Completed Course    Meds ordered this encounter  Medications  . omeprazole (PRILOSEC) 40 MG capsule    Sig: Take 1 capsule (40 mg total) by mouth daily.    Dispense:  30 capsule    Refill:  1  . nystatin cream (MYCOSTATIN)    Sig: Apply 1 application topically 2 (two) times daily.    Dispense:  30 g    Refill:  0    Red Flags discussed. The patient was given clear instructions to go to ER or return to medical center if any red flags develop, symptoms do not improve, worsen or new problems develop. They verbalized understanding. Orders Placed This Encounter  Procedures  . H. pylori antigen, stool  . CBC with Differential/Platelet  . Comprehensive Metabolic Panel (CMET)  . TSH  . Ambulatory referral to Gastroenterology  Referral Priority:   Routine    Referral Type:   Consultation    Referral Reason:   Specialty Services Required    Number of  Visits Requested:   1  . Ambulatory referral to Dermatology    Referral Priority:   Routine    Referral Type:   Consultation    Referral Reason:   Specialty Services Required    Referred to Provider:   Wanita Chamberlain, MD    Requested Specialty:   Dermatology    Number of Visits Requested:   1     Return in about 3 weeks (around 03/14/2020), or if symptoms worsen or fail to improve, for at any time for any worsening symptoms.      The entirety of the information documented in the History of Present Illness, Review of Systems and Physical Exam were personally obtained by me. Portions of this information were initially documented by the CMA and reviewed by me for thoroughness and accuracy.    Red Flags discussed. The patient was given clear instructions to go to ER or return to medical center if any red flags develop, symptoms do not improve, worsen or new problems develop. They verbalized understanding.    Jairo Ben, FNP  [facility] [phone] (phone) [phone] (fax)  [facility]

## 2020-02-22 ENCOUNTER — Ambulatory Visit: Payer: 59 | Admitting: Adult Health

## 2020-02-22 ENCOUNTER — Other Ambulatory Visit: Payer: Self-pay

## 2020-02-22 ENCOUNTER — Encounter: Payer: Self-pay | Admitting: Adult Health

## 2020-02-22 VITALS — BP 100/75 | HR 69 | Temp 97.7°F | Resp 16 | Wt 193.4 lb

## 2020-02-22 DIAGNOSIS — E559 Vitamin D deficiency, unspecified: Secondary | ICD-10-CM

## 2020-02-22 DIAGNOSIS — R61 Generalized hyperhidrosis: Secondary | ICD-10-CM

## 2020-02-22 DIAGNOSIS — B372 Candidiasis of skin and nail: Secondary | ICD-10-CM

## 2020-02-22 DIAGNOSIS — K219 Gastro-esophageal reflux disease without esophagitis: Secondary | ICD-10-CM | POA: Diagnosis not present

## 2020-02-22 DIAGNOSIS — R1084 Generalized abdominal pain: Secondary | ICD-10-CM

## 2020-02-22 MED ORDER — NYSTATIN 100000 UNIT/GM EX CREA: 1.0000 "application " | TOPICAL_CREAM | Freq: Two times a day (BID) | CUTANEOUS | 0 refills | Status: DC

## 2020-02-22 MED ORDER — OMEPRAZOLE 40 MG PO CPDR: 40.0000 mg | DELAYED_RELEASE_CAPSULE | Freq: Every day | ORAL | 1 refills | Status: DC

## 2020-02-22 NOTE — Patient Instructions (Signed)
Peptic Ulcer  A peptic ulcer is a painful sore in the lining of your stomach or the first part of your small intestine. What are the causes? Common causes of this condition include:  An infection.  Using certain pain medicines too often or too much. What increases the risk? You are more likely to get this condition if you:  Smoke.  Have a family history of ulcer disease.  Drink alcohol.  Have been hospitalized in an intensive care unit (ICU). What are the signs or symptoms? Symptoms include:  Burning pain in the area between the chest and the belly button. The pain may: ? Not go away (be persistent). ? Be worse when your stomach is empty. ? Be worse at night.  Heartburn.  Feeling sick to your stomach (nauseous) and throwing up (vomiting).  Bloating. If the ulcer results in bleeding, it can cause you to:  Have poop (stool) that is black and looks like tar.  Throw up bright red blood.  Throw up material that looks like coffee grounds. How is this treated? Treatment for this condition may include:  Stopping things that can cause the ulcer, such as: ? Smoking. ? Using pain medicines.  Medicines to reduce stomach acid.  Antibiotic medicines if the ulcer is caused by an infection.  A procedure that is done using a small, flexible tube that has a camera at the end (upper endoscopy). This may be done if you have a bleeding ulcer.  Surgery. This may be needed if: ? You have a lot of bleeding. ? The ulcer caused a hole somewhere in the digestive system. Follow these instructions at home:  Do not drink alcohol if your doctor tells you not to drink.  Limit how much caffeine you take in.  Do not use any products that contain nicotine or tobacco, such as cigarettes, e-cigarettes, and chewing tobacco. If you need help quitting, ask your doctor.  Take over-the-counter and prescription medicines only as told by your doctor. ? Do not stop or change your medicines unless  you talk with your doctor about it first. ? Do not take aspirin, ibuprofen, or other NSAIDs unless your doctor told you to do so.  Keep all follow-up visits as told by your doctor. This is important. Contact a doctor if:  You do not get better in 7 days after you start treatment.  You keep having an upset stomach (indigestion) or heartburn. Get help right away if:  You have sudden, sharp pain in your belly (abdomen).  You have belly pain that does not go away.  You have bloody poop (stool) or black, tarry poop.  You throw up blood. It may look like coffee grounds.  You feel light-headed or feel like you may pass out (faint).  You get weak.  You get sweaty or feel sticky and cold to the touch (clammy). Summary  Symptoms of a peptic ulcer include burning pain in the area between the chest and the belly button.  Take medicines only as told by your doctor.  Limit how much alcohol and caffeine you have.  Keep all follow-up visits as told by your doctor. This information is not intended to replace advice given to you by your health care provider. Make sure you discuss any questions you have with your health care provider. Document Revised: 08/19/2017 Document Reviewed: 08/19/2017 Elsevier Patient Education  2020 [employer] Breast Self-Awareness Breast self-awareness is knowing how your breasts look and feel. Doing breast self-awareness is important. It allows  you to catch a breast problem early while it is still small and can be treated. All women should do breast self-awareness, including women who have had breast implants. Tell your doctor if you notice a change in your breasts. What you need:  A mirror.  A well-lit room. How to do a breast self-exam A breast self-exam is one way to learn what is normal for your breasts and to check for changes. To do a breast self-exam: Look for changes  1. Take off all the clothes above your waist. 2. Stand in front of a mirror in a  room with good lighting. 3. Put your hands on your hips. 4. Push your hands down. 5. Look at your breasts and nipples in the mirror to see if one breast or nipple looks different from the other. Check to see if: ? The shape of one breast is different. ? The size of one breast is different. ? There are wrinkles, dips, and bumps in one breast and not the other. 6. Look at each breast for changes in the skin, such as: ? Redness. ? Scaly areas. 7. Look for changes in your nipples, such as: ? Liquid around the nipples. ? Bleeding. ? Dimpling. ? Redness. ? A change in where the nipples are. Feel for changes  1. Lie on your back on the floor. 2. Feel each breast. To do this, follow these steps: ? Pick a breast to feel. ? Put the arm closest to that breast above your head. ? Use your other arm to feel the nipple area of your breast. Feel the area with the pads of your three middle fingers by making small circles with your fingers. For the first circle, press lightly. For the second circle, press harder. For the third circle, press even harder. ? Keep making circles with your fingers at the different pressures as you move down your breast. Stop when you feel your ribs. ? Move your fingers a little toward the center of your body. ? Start making circles with your fingers again, this time going up until you reach your collarbone. ? Keep making up-and-down circles until you reach your armpit. Remember to keep using the three pressures. ? Feel the other breast in the same way. 3. Sit or stand in the tub or shower. 4. With soapy water on your skin, feel each breast the same way you did in step 2 when you were lying on the floor. Write down what you find Writing down what you find can help you remember what to tell your doctor. Write down:  What is normal for each breast.  Any changes you find in each breast, including: ? The kind of changes you find. ? Whether you have pain. ? Size and  location of any lumps.  When you last had your menstrual period. General tips  Check your breasts every month.  If you are breastfeeding, the best time to check your breasts is after you feed your baby or after you use a breast pump.  If you get menstrual periods, the best time to check your breasts is 5-7 days after your menstrual period is over.  With time, you will become comfortable with the self-exam, and you will begin to know if there are changes in your breasts. Contact a doctor if you:  See a change in the shape or size of your breasts or nipples.  See a change in the skin of your breast or nipples, such as red or scaly  skin.  Have fluid coming from your nipples that is not normal.  Find a lump or thick area that was not there before.  Have pain in your breasts.  Have any concerns about your breast health. Summary  Breast self-awareness includes looking for changes in your breasts, as well as feeling for changes within your breasts.  Breast self-awareness should be done in front of a mirror in a well-lit room.  You should check your breasts every month. If you get menstrual periods, the best time to check your breasts is 5-7 days after your menstrual period is over.  Let your doctor know of any changes you see in your breasts, including changes in size, changes on the skin, pain or tenderness, or fluid from your nipples that is not normal. This information is not intended to replace advice given to you by your health care provider. Make sure you discuss any questions you have with your health care provider. Document Revised: 09/30/2017 Document Reviewed: 09/30/2017 Elsevier Patient Education  2020 [employer].

## 2020-02-23 ENCOUNTER — Other Ambulatory Visit: Payer: Self-pay | Admitting: Adult Health

## 2020-02-23 DIAGNOSIS — E559 Vitamin D deficiency, unspecified: Secondary | ICD-10-CM

## 2020-02-23 LAB — CBC WITH DIFFERENTIAL/PLATELET
Basophils Absolute: 0.1 10*3/uL (ref 0.0–0.2)
Basos: 1 %
EOS (ABSOLUTE): 0.1 10*3/uL (ref 0.0–0.4)
Eos: 1 %
Hematocrit: 37 % (ref 34.0–46.6)
Hemoglobin: 12.4 g/dL (ref 11.1–15.9)
Immature Grans (Abs): 0 10*3/uL (ref 0.0–0.1)
Immature Granulocytes: 0 %
Lymphocytes Absolute: 2.6 10*3/uL (ref 0.7–3.1)
Lymphs: 26 %
MCH: 26.9 pg (ref 26.6–33.0)
MCHC: 33.5 g/dL (ref 31.5–35.7)
MCV: 80 fL (ref 79–97)
Monocytes Absolute: 0.5 10*3/uL (ref 0.1–0.9)
Monocytes: 5 %
Neutrophils Absolute: 6.5 10*3/uL (ref 1.4–7.0)
Neutrophils: 67 %
Platelets: 351 10*3/uL (ref 150–450)
RBC: 4.61 x10E6/uL (ref 3.77–5.28)
RDW: 12.2 % (ref 11.7–15.4)
WBC: 9.7 10*3/uL (ref 3.4–10.8)

## 2020-02-23 LAB — COMPREHENSIVE METABOLIC PANEL
ALT: 20 IU/L (ref 0–32)
AST: 13 IU/L (ref 0–40)
Albumin/Globulin Ratio: 1.9 (ref 1.2–2.2)
Albumin: 4.5 g/dL (ref 3.9–5.0)
Alkaline Phosphatase: 69 IU/L (ref 44–121)
BUN/Creatinine Ratio: 17 (ref 9–23)
BUN: 13 mg/dL (ref 6–20)
Bilirubin Total: 0.3 mg/dL (ref 0.0–1.2)
CO2: 20 mmol/L (ref 20–29)
Calcium: 9.5 mg/dL (ref 8.7–10.2)
Chloride: 102 mmol/L (ref 96–106)
Creatinine, Ser: 0.77 mg/dL (ref 0.57–1.00)
GFR calc Af Amer: 124 mL/min/{1.73_m2} (ref >59)
GFR calc non Af Amer: 108 mL/min/{1.73_m2} (ref >59)
Globulin, Total: 2.4 g/dL (ref 1.5–4.5)
Glucose: 90 mg/dL (ref 65–99)
Potassium: 4.7 mmol/L (ref 3.5–5.2)
Sodium: 137 mmol/L (ref 134–144)
Total Protein: 6.9 g/dL (ref 6.0–8.5)

## 2020-02-23 LAB — VITAMIN D 25 HYDROXY (VIT D DEFICIENCY, FRACTURES): Vit D, 25-Hydroxy: 27.1 ng/mL — ABNORMAL LOW (ref 30.0–100.0)

## 2020-02-23 LAB — TSH: TSH: 1.37 u[IU]/mL (ref 0.450–4.500)

## 2020-02-23 MED ORDER — VITAMIN D (ERGOCALCIFEROL) 1.25 MG (50000 UNIT) PO CAPS: 50000.0000 [IU] | ORAL_CAPSULE | ORAL | 0 refills | Status: DC

## 2020-02-23 NOTE — Progress Notes (Addendum)
Meds ordered this encounter  Medications  . Vitamin D, Ergocalciferol, (DRISDOL) 1.25 MG (50000 UNIT) CAPS capsule    Sig: Take 1 capsule (50,000 Units total) by mouth every 7 (seven) days. (taking one tablet per week) walk in lab in office 1-2 weeks after completing prescription.    Dispense:  12 capsule    Refill:  0   Orders Placed This Encounter  Procedures  . VITAMIN D 25 Hydroxy (Vit-D Deficiency, Fractures)   

## 2020-02-23 NOTE — Progress Notes (Signed)
Disregard message last sent, patient is not trying to concieve and is not on blood pressure medication. Correct note sent to [facility].

## 2020-02-23 NOTE — Progress Notes (Addendum)
CBC and CMP within normal limits. TSH for thyroid within normal limits.  Vitamin D is low,recomend vitamin D prescription sent to pharmacy recheck vitamin D lab 1- 2 weeks after completing prescription.

## 2020-03-10 ENCOUNTER — Encounter: Payer: Self-pay | Admitting: Adult Health

## 2020-03-10 ENCOUNTER — Ambulatory Visit: Payer: 59 | Admitting: Adult Health

## 2020-03-16 ENCOUNTER — Encounter: Payer: Self-pay | Admitting: *Deleted

## 2020-03-17 ENCOUNTER — Encounter: Payer: Self-pay | Admitting: Adult Health

## 2020-03-17 ENCOUNTER — Ambulatory Visit (INDEPENDENT_AMBULATORY_CARE_PROVIDER_SITE_OTHER): Payer: 59 | Admitting: Adult Health

## 2020-03-17 ENCOUNTER — Other Ambulatory Visit: Payer: Self-pay

## 2020-03-17 VITALS — BP 105/79 | HR 71 | Ht 59.0 in | Wt 194.5 lb

## 2020-03-17 DIAGNOSIS — N926 Irregular menstruation, unspecified: Secondary | ICD-10-CM

## 2020-03-17 DIAGNOSIS — N946 Dysmenorrhea, unspecified: Secondary | ICD-10-CM | POA: Diagnosis not present

## 2020-03-17 DIAGNOSIS — N941 Unspecified dyspareunia: Secondary | ICD-10-CM | POA: Diagnosis not present

## 2020-03-17 DIAGNOSIS — G8929 Other chronic pain: Secondary | ICD-10-CM

## 2020-03-17 DIAGNOSIS — N809 Endometriosis, unspecified: Secondary | ICD-10-CM | POA: Insufficient documentation

## 2020-03-17 DIAGNOSIS — R102 Pelvic and perineal pain: Secondary | ICD-10-CM

## 2020-03-17 MED ORDER — ORILISSA 150 MG PO TABS: ORAL_TABLET | ORAL | 0 refills | Status: DC

## 2020-03-17 NOTE — Progress Notes (Addendum)
  Subjective:     Patient ID: Courtney Spencer, female   DOB: [birth year], 25 y.o.   MRN: [id number]  HPI Courtney Spencer is a 25 year old white female, married, G0P0 in complaining pf pelvic pain and irregular periods, pain with sex too.She stopped Micronor. PCP is Courtney Fuller NP.  Review of Systems +pelvic pain +pain with sex +pain with periods +irregualr periods  Reviewed past medical,surgical, social and family history. Reviewed medications and allergies.      Objective:   Physical Exam BP 105/79 (BP Location: Left Arm, Patient Position: Sitting, Cuff Size: Normal)   Pulse 71   Ht 4\' 11"  (1.499 m)   Wt 194 lb 8 oz (88.2 kg)   LMP 02/27/2020   BMI 39.28 kg/m    Skin warm and dry.Pelvic: external genitalia is normal in appearance no lesions, vagina: pink, no discahrge,urethra has no lesions or masses noted, cervix:smooth, uterus: normal size, shape and contour, + tender, no masses felt, adnexa: no masses,RLQ tenderness noted. Bladder is non tender and no masses felt.  Pt gave verbal consent for exam without chaperone  Fall risk is low  [facility] - 03/17/20 0946      Pregnancy Intention Screening   Does the patient want to become pregnant in the next year? Unsure    Does the patient's partner want to become pregnant in the next year? Unsure    Would the patient like to discuss contraceptive options today? Yes      Contraception Wrap Up   Current Method No Method - Other Reason    End Method Oral Contraceptive    Contraception Counseling Provided Yes          Assessment:     1. Irregular periods Resume Micronor with next period,has refills, and use condoms   2. Dyspareunia in female Will try Orilissa   3. Dysmenorrhea  4. Chronic pelvic pain in female  5. Endometriosis Will try Orilisssa Meds ordered this encounter  Medications  . Elagolix Sodium (ORILISSA) 150 MG TABS    Sig: Take 1 daily    Dispense:  28 tablet    Refill:  0    Order Specific  Question:   Supervising Provider    Answer:   03/19/20 [2510]      Plan:     Follow up in 4 weeks

## 2020-03-28 ENCOUNTER — Other Ambulatory Visit: Payer: Self-pay

## 2020-03-28 ENCOUNTER — Ambulatory Visit
Admission: RE | Admit: 2020-03-28 | Discharge: 2020-03-28 | Disposition: A | Discharge status: Home / Self Care | Payer: 59 | Source: Ambulatory Visit | Attending: Family Medicine | Admitting: Family Medicine

## 2020-03-28 VITALS — BP 117/79 | HR 80 | Temp 97.7°F | Resp 18 | Ht 59.0 in | Wt 190.0 lb

## 2020-03-28 DIAGNOSIS — Z20822 Contact with and (suspected) exposure to covid-19: Secondary | ICD-10-CM

## 2020-03-28 DIAGNOSIS — R059 Cough, unspecified: Secondary | ICD-10-CM | POA: Diagnosis not present

## 2020-03-28 DIAGNOSIS — H66003 Acute suppurative otitis media without spontaneous rupture of ear drum, bilateral: Secondary | ICD-10-CM

## 2020-03-28 DIAGNOSIS — J029 Acute pharyngitis, unspecified: Secondary | ICD-10-CM

## 2020-03-28 MED ORDER — AMOXICILLIN-POT CLAVULANATE 875-125 MG PO TABS: 1.0000 | ORAL_TABLET | Freq: Two times a day (BID) | ORAL | 0 refills | Status: AC

## 2020-03-28 MED ORDER — FLUCONAZOLE 150 MG PO TABS: ORAL_TABLET | ORAL | 0 refills | Status: DC

## 2020-03-28 NOTE — ED Triage Notes (Signed)
Cough, itchy throat since this morning.  Took home test for covid and was negative.  Both ears are hurting.

## 2020-03-28 NOTE — ED Provider Notes (Signed)
[facility]   [id number] 03/28/20 Arrival Time: 1242   CC: COVID symptoms  SUBJECTIVE: History from: patient.  Courtney Spencer is a 25 y.o. female who presents with itchy throat, cough, bilateral ear pain for the last week. Took breath at home COVID test this morning and it was negative. Denies sick exposure to COVID, flu or strep. Denies recent travel. Has negative history of Covid. Has completed Covid vaccines. Has not completed flu vaccine this year has not taken OTC medications for this. There are no aggravating or alleviating factors. Denies previous symptoms in the past. Denies fever, chills, fatigue, sinus pain, rhinorrhea, sore throat, SOB, wheezing, chest pain, nausea, changes in bowel or bladder habits.    ROS: As per HPI.  All other pertinent ROS negative.     Past Medical History:  Diagnosis Date  . Allergy   . Anxiety   . Cat scratch fever   . Depression   . GERD (gastroesophageal reflux disease)   . Heart murmur of newborn   . Helicobacter pylori ab+    H.pylori serology positive in Jan 2015, treated with Amoxicillin and Flagyl at that time. Returned to clinic Feb 2015 and had rechecked, with a different provider treating with Amoxicillin and Biaxin. H.pylori stool antigen ordered April 2015 and negative.  . Hematuria 08/09/2013  . History of kidney stones   . Kidney stones 10/26/2013  . Migraines   . Other and unspecified ovarian cyst 08/09/2013  . Ovarian cyst   . UTI (lower urinary tract infection)    Past Surgical History:  Procedure Laterality Date  . CHOLECYSTECTOMY N/A 05/12/2015   Procedure: LAPAROSCOPIC CHOLECYSTECTOMY;  Surgeon: Franky Macho, MD;  Location: [facility];  Service: General;  Laterality: N/A;  . ESOPHAGOGASTRODUODENOSCOPY N/A 08/13/2013   Dr. Darrick Penna: nodular gastritis, negative H.pylori  . None    . WISDOM TOOTH EXTRACTION     No Known Allergies No current facility-administered medications on file prior to encounter.    Current Outpatient Medications on File Prior to Encounter  Medication Sig Dispense Refill  . Elagolix Sodium (ORILISSA) 150 MG TABS Take 1 daily 28 tablet 0  . glycopyrrolate (ROBINUL) 1 MG tablet Take 1 mg by mouth 2 (two) times daily.    . norethindrone (MICRONOR) 0.35 MG tablet Take 1 tablet (0.35 mg total) by mouth daily. (Patient not taking: Reported on 03/17/2020) 28 tablet 11  . omeprazole (PRILOSEC) 40 MG capsule Take 1 capsule (40 mg total) by mouth daily. 30 capsule 1  . Vitamin D, Ergocalciferol, (DRISDOL) 1.25 MG (50000 UNIT) CAPS capsule Take 1 capsule (50,000 Units total) by mouth every 7 (seven) days. (taking one tablet per week) walk in lab in office 1-2 weeks after completing prescription. 12 capsule 0   Social History   Socioeconomic History  . Marital status: Married    Spouse name: Not on file  . Number of children: Not on file  . Years of education: Not on file  . Highest education level: Not on file  Occupational History  . 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 Smoking status: Never Smoker  . Smokeless tobacco: Never Used  . Tobacco comment: Never really smoked  Vaping Use  . Vaping Use: Never used  Substance and Sexual Activity  . Alcohol use: No    Alcohol/week: 0.0 standard drinks  . Drug use: No  . Sexual  activity: Yes    Birth control/protection: Pill  Other Topics Concern  . Not on file  Social History Narrative  . Not on file   Social Determinants of Health   Financial Resource Strain: Low Risk   . Difficulty of Paying Living Expenses: Not hard at all  Food Insecurity: No Food Insecurity  . Worried About Programme researcher, broadcasting/film/video in the Last Year: Never true  . Ran Out of Food in the Last Year: Never true  Transportation Needs: No Transportation Needs  . Lack of Transportation (Medical): No  . Lack of Transportation (Non-Medical): No  Physical  Activity: Insufficiently Active  . Days of Exercise per Week: 2 days  . Minutes of Exercise per Session: 30 min  Stress: Stress Concern Present  . Feeling of Stress : Very much  Social Connections: Moderately Isolated  . Frequency of Communication with Friends and Family: More than three times a week  . Frequency of Social Gatherings with Friends and Family: More than three times a week  . Attends Religious Services: Never  . Active Member of Clubs or Organizations: No  . Attends Banker Meetings: Never  . Marital Status: Married  Catering manager Violence: Not At Risk  . Fear of Current or Ex-Partner: No  . Emotionally Abused: No  . Physically Abused: No  . Sexually Abused: No   Family History  Problem Relation Age of Onset  . Gallbladder disease Mother        gallbladder was removed  . Migraines Mother   . Migraines Sister   . Hypertension Sister   . ADD / ADHD Brother   . Hypertension Maternal Grandmother   . Cancer Maternal Grandfather        lung  . Cancer Paternal Grandfather        lung  . Seizures Sister   . Scoliosis Sister   . ADD / ADHD Brother   . Colon cancer Other        unknown    OBJECTIVE:  Vitals:   03/28/20 1253  BP: 117/79  Pulse: 80  Resp: 18  Temp: 97.7 F (36.5 C)  TempSrc: Oral  SpO2: 99%  Weight: 190 lb (86.2 kg)  Height: 4\' 11"  (1.499 m)     General appearance: alert; appears fatigued, but nontoxic; speaking in full sentences and tolerating own secretions HEENT: NCAT; Ears: EACs clear, bilateral TMs erythematous and bulging with effusion; Eyes: PERRL.  EOM grossly intact. Sinuses: nontender; Nose: nares patent with clear rhinorrhea, Throat: oropharynx erythematous, cobblestoning present, tonsils non erythematous or enlarged, uvula midline  Neck: supple without LAD Lungs: unlabored respirations, symmetrical air entry; cough: mild; no respiratory distress; CTAB Heart: regular rate and rhythm.  Radial pulses 2+ symmetrical  bilaterally Skin: warm and dry Psychological: alert and cooperative; normal mood and affect  LABS:  No results found for this or any previous visit (from the past 24 hour(s)).   ASSESSMENT & PLAN:  1. Non-recurrent acute suppurative otitis media of both ears without spontaneous rupture of tympanic membranes   2. Exposure to COVID-19 virus   3. Sore throat   4. Cough     Meds ordered this encounter  Medications  . amoxicillin-clavulanate (AUGMENTIN) 875-125 MG tablet    Sig: Take 1 tablet by mouth 2 (two) times daily for 7 days.    Dispense:  14 tablet    Refill:  0    Order Specific Question:   Supervising Provider    Answer:  LAMPTEY, PHILIP O [id number]  . fluconazole (DIFLUCAN) 150 MG tablet    Sig: Take one tablet at the onset of symptoms. If symptoms are still present 3 days later, take the second tablet.    Dispense:  2 tablet    Refill:  0    Order Specific Question:   Supervising Provider    Answer:   Merrilee Jansky [id number]    Prescribed Augmentin twice daily x7 days for ear infection Prescribed fluconazole in case of yeast Continue supportive care at home COVID and flu testing ordered.  It will take between 2-3 days for test results. Someone will contact you regarding abnormal results.   Work note provided Patient should remain in quarantine until they have received Covid results.  If negative you may resume normal activities (go back to work/school) while practicing hand hygiene, social distance, and mask wearing.  If positive, patient should remain in quarantine for at least 5 days from symptom onset AND greater than 72 hours after symptoms resolution (absence of fever without the use of fever-reducing medication and improvement in respiratory symptoms), whichever is longer Get plenty of rest and push fluids Use OTC zyrtec for nasal congestion, runny nose, and/or sore throat Use OTC flonase for nasal congestion and runny nose Use medications daily for symptom  relief Use OTC medications like ibuprofen or tylenol as needed fever or pain Call or go to the ED if you have any new or worsening symptoms such as fever, worsening cough, shortness of breath, chest tightness, chest pain, turning blue, changes in mental status.  Reviewed expectations re: course of current medical issues. Questions answered. Outlined signs and symptoms indicating need for more acute intervention. Patient verbalized understanding. After Visit Summary given.         Moshe Cipro, NP 03/28/20 1326

## 2020-03-28 NOTE — Discharge Instructions (Signed)
I have sent in Augmentin for you to take twice a day for 7 days.  Your COVID test is pending.  You should self quarantine until the test result is back.    Take Tylenol or ibuprofen as needed for fever or discomfort.  Rest and keep yourself hydrated.    Follow-up with your primary care provider if your symptoms are not improving.     

## 2020-03-30 LAB — COVID-19, FLU A+B NAA
Influenza A, NAA: NOT DETECTED
Influenza B, NAA: NOT DETECTED
SARS-CoV-2, NAA: DETECTED — AB

## 2020-04-06 ENCOUNTER — Other Ambulatory Visit: Payer: Self-pay

## 2020-04-06 ENCOUNTER — Encounter: Payer: Self-pay | Admitting: *Deleted

## 2020-04-06 ENCOUNTER — Encounter: Payer: Self-pay | Admitting: Nurse Practitioner

## 2020-04-06 ENCOUNTER — Ambulatory Visit: Payer: 59 | Admitting: Nurse Practitioner

## 2020-04-06 VITALS — BP 108/75 | HR 80 | Temp 97.3°F | Ht 59.0 in | Wt 194.2 lb

## 2020-04-06 DIAGNOSIS — K219 Gastro-esophageal reflux disease without esophagitis: Secondary | ICD-10-CM

## 2020-04-06 DIAGNOSIS — R1013 Epigastric pain: Secondary | ICD-10-CM | POA: Diagnosis not present

## 2020-04-06 MED ORDER — OMEPRAZOLE 40 MG PO CPDR
40.0000 mg | DELAYED_RELEASE_CAPSULE | Freq: Two times a day (BID) | ORAL | 3 refills | Status: DC
Start: 2020-04-06 — End: 2020-07-14

## 2020-04-06 NOTE — Progress Notes (Signed)
  Referring Provider: Flinchum, Michelle S, F* Primary Care Physician:  Flinchum, Michelle S, FNP Primary GI:  Dr. Carver  Chief Complaint  Patient presents with  . Gastroesophageal Reflux  . Abdominal Pain    "all over" but mostly mid upper abd    HPI:   Courtney Spencer is a 24 y.o. female who presents on referral from primary care for GERD symptoms.  The patient has not been seen in our office since 08/16/2015 when she was seen for abdominal pain and diarrhea.  Status post laparoscopic cholecystectomy 05/12/2015 without complications.  Abdominal pain resolved after then 1 with diarrhea, but is since returned although diarrhea not severe.  Only occurring every 2 to 3 weeks.  Recommended continue Levsin, probiotic daily, follow-up in 3 months.  Patient was not seen and appears to been lost to follow-up.  Reviewed information associated with her current referral including office visit 02/22/2020 when she was seen for multiple primary care issues including abdominal pain and GERD.  At that time noted she was seen for GERD 3 months prior, started on omeprazole 20 mg and notes "fair compliance".  She does note fullness after meals, mid epigastric pain, heartburn, nausea, frequent throat clearing and regurgitation of undigested food.  She was on Prilosec 20 mg daily at that time.  Recommended H. pylori antigen, CBC, CMP, referral to GI.  CBC and CMP were completed 02/22/2020, both of which were completely normal.  Does not appear that the H. pylori antigen/stool was completed.  Today she states doing okay overall. Since she has been on PPI, she has not had any significant improvement. Symptoms worse in the early morning and later at night. Only taking it once a day. Having abdominal pain, mostly upper abdomen/epigastric described as burning. Her more generalized pain seems linked to her GERD symptoms and epigastric pain based on her description. Generalized pain doesn't improved with a  bowel movement. Notes bitter taste, esophageal burning. Has intermittent nausea but no vomiting. Bowel movements are mostly loose which is chronic for her. She was prescribed a medication by PCP but hasn't been able to pick it up yet. Denies hematochezia, melena. Admits occasional NSAIDs, about once a month. Denies ASA powders. Denies fever, chills, unintentional weight loss. Denies URI or flu-like symptoms. Denies loss of sense of taste or smell. The patient has received COVID-19 vaccination(s). Denies chest pain, dyspnea, dizziness, lightheadedness, syncope, near syncope. Denies any other upper or lower GI symptoms.  Interestingly, as related to generalized/lower abdominal pain, she has been diagnosed with endometriosis and they are trying to manage that with birth control; management is ongoing.  Past Medical History:  Diagnosis Date  . Allergy   . Anxiety   . Cat scratch fever   . Depression   . Endometriosis   . GERD (gastroesophageal reflux disease)   . Heart murmur of newborn   . Helicobacter pylori ab+    H.pylori serology positive in Jan 2015, treated with Amoxicillin and Flagyl at that time. Returned to clinic Feb 2015 and had rechecked, with a different provider treating with Amoxicillin and Biaxin. H.pylori stool antigen ordered April 2015 and negative.  . Hematuria 08/09/2013  . History of kidney stones   . Kidney stones 10/26/2013  . Migraines   . Other and unspecified ovarian cyst 08/09/2013  . Ovarian cyst   . UTI (lower urinary tract infection)     Past Surgical History:  Procedure Laterality Date  . CHOLECYSTECTOMY N/A 05/12/2015   Procedure: LAPAROSCOPIC   CHOLECYSTECTOMY;  Surgeon: Mark Jenkins, MD;  Location: [facility];  Service: General;  Laterality: N/A;  . ESOPHAGOGASTRODUODENOSCOPY N/A 08/13/2013   Dr. Fields: nodular gastritis, negative H.pylori  . WISDOM TOOTH EXTRACTION      Current Outpatient Medications  Medication Sig Dispense Refill  . fluconazole (DIFLUCAN)  150 MG tablet Take one tablet at the onset of symptoms. If symptoms are still present 3 days later, take the second tablet. 2 tablet 0  . glycopyrrolate (ROBINUL) 1 MG tablet Take 1 mg by mouth 2 (two) times daily.    . norethindrone (MICRONOR) 0.35 MG tablet Take 1 tablet (0.35 mg total) by mouth daily. 28 tablet 11  . omeprazole (PRILOSEC) 40 MG capsule Take 1 capsule (40 mg total) by mouth daily. 30 capsule 1  . Vitamin D, Ergocalciferol, (DRISDOL) 1.25 MG (50000 UNIT) CAPS capsule Take 1 capsule (50,000 Units total) by mouth every 7 (seven) days. (taking one tablet per week) walk in lab in office 1-2 weeks after completing prescription. 12 capsule 0   No current facility-administered medications for this visit.    Allergies as of 04/06/2020  . (No Known Allergies)    Family History  Problem Relation Age of Onset  . Gallbladder disease Mother        gallbladder was removed  . Migraines Mother   . Migraines Sister   . Hypertension Sister   . ADD / ADHD Brother   . Hypertension Maternal Grandmother   . Cancer Maternal Grandfather        lung  . Cancer Paternal Grandfather        lung  . Seizures Sister   . Scoliosis Sister   . ADD / ADHD Brother   . Colon cancer Other        unknown    Social History   Socioeconomic History  . Marital status: Married    Spouse name: Not on file  . Number of children: Not on file  . Years of education: Not on file  . Highest education level: Not on file  Occupational History  . 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 Smoking status: Never Smoker  . Smokeless tobacco: Never Used  . Tobacco comment: Never really smoked  Vaping Use  . Vaping Use: Never used  Substance and Sexual Activity  . Alcohol use: No    Alcohol/week: 0.0 standard drinks  . Drug use: No  . Sexual activity: Yes    Birth control/protection: Pill  Other Topics Concern   . Not on file  Social History Narrative  . Not on file   Social Determinants of Health   Financial Resource Strain: Low Risk   . Difficulty of Paying Living Expenses: Not hard at all  Food Insecurity: No Food Insecurity  . Worried About Running Out of Food in the Last Year: Never true  . Ran Out of Food in the Last Year: Never true  Transportation Needs: No Transportation Needs  . Lack of Transportation (Medical): No  . Lack of Transportation (Non-Medical): No  Physical Activity: Insufficiently Active  . Days of Exercise per Week: 2 days  . Minutes of Exercise per Session: 30 min  Stress: Stress Concern Present  . Feeling of Stress : Very much  Social Connections: Moderately Isolated  . Frequency of Communication with Friends and Family: More than three times a week  . Frequency of   Social Gatherings with Friends and Family: More than three times a week  . Attends Religious Services: Never  . Active Member of Clubs or Organizations: No  . Attends Archivist Meetings: Never  . Marital Status: Married    Subjective: Review of Systems  Constitutional: Negative for chills, fever, malaise/fatigue and weight loss.  HENT: Negative for congestion and sore throat.   Respiratory: Negative for cough and shortness of breath.   Cardiovascular: Negative for chest pain and palpitations.  Gastrointestinal: Positive for abdominal pain, heartburn and nausea. Negative for blood in stool, constipation, diarrhea, melena and vomiting.  Musculoskeletal: Negative for joint pain and myalgias.  Skin: Negative for rash.  Neurological: Negative for dizziness and weakness.  Endo/Heme/Allergies: Does not bruise/bleed easily.  Psychiatric/Behavioral: Negative for depression. The patient is not nervous/anxious.   All other systems reviewed and are negative.    Objective: BP 108/75   Pulse 80   Temp (!) 97.3 F (36.3 C) (Temporal)   Ht _0  (1.499 m)   Wt 194 lb 3.2 oz (88.1 kg)   LMP  03/30/2020 (Approximate)   BMI 39.22 kg/m  Physical Exam Vitals and nursing note reviewed.  Constitutional:      General: She is not in acute distress.    Appearance: Normal appearance. She is well-developed. She is obese. She is not ill-appearing, toxic-appearing or diaphoretic.  HENT:     Head: Normocephalic and atraumatic.     Nose: No congestion or rhinorrhea.  Eyes:     General: No scleral icterus. Cardiovascular:     Rate and Rhythm: Normal rate and regular rhythm.     Heart sounds: Normal heart sounds.  Pulmonary:     Effort: Pulmonary effort is normal. No respiratory distress.     Breath sounds: Normal breath sounds.  Abdominal:     General: Bowel sounds are normal.     Palpations: Abdomen is soft. There is no hepatomegaly, splenomegaly or mass.     Tenderness: There is abdominal tenderness in the epigastric area. There is no guarding or rebound.     Hernia: No hernia is present.  Skin:    General: Skin is warm and dry.     Coloration: Skin is not jaundiced.     Findings: No rash.  Neurological:     General: No focal deficit present.     Mental Status: She is alert and oriented to person, place, and time.  Psychiatric:        Attention and Perception: Attention normal.        Mood and Affect: Mood normal.        Speech: Speech normal.        Behavior: Behavior normal.        Thought Content: Thought content normal.        Cognition and Memory: Cognition and memory normal.      Assessment:  Very pleasant 25 year old female presents for worsening GERD/abdominal pain unrelieved by PPI. Remote history of H. pylori gastritis in 2015 treated with antibiotics. She began having worsening symptoms about 3 to 4 months ago, was started on omeprazole 20 mg daily. This is since increased to 40 mg daily. However, she has not had significant relief from this. She has persistent midepigastric pain, typical GERD symptoms such as esophageal burning, bitter taste, nausea. She has been  on a PPI for several months. In the interest of symptom management I will increase this to 40 mg twice a day for now. I feel it  is worth proceeding with an upper endoscopy at this time as well to evaluate for recurrent H. pylori, erosions, ulcerations, mucosal damage from NSAID gastritis. In the meantime she can call us for any worsening symptoms. We will follow-up in about 2 months to evaluate her progress.   Proceed with EGD on propofol/MAC with Dr. Carver on propofol/MAC in near future: the risks, benefits, and alternatives have been discussed with the patient in detail. The patient states understanding and desires to proceed.  ASA II   Plan: 1. Increase omeprazole to 40 mg twice a day 2. EGD as described above 3. Call for any worsening 4. Follow-up in 2 months    Thank you for allowing us to participate in the care of Danyele M Tilley Spencer  Peniel Biel, DNP, AGNP-C Adult & Gerontological Nurse Practitioner [facility]   04/06/2020 3:38 PM   Disclaimer: This note was dictated with voice recognition software. Similar sounding words can inadvertently be transcribed and may not be corrected upon review.  

## 2020-04-06 NOTE — H&P (View-Only) (Signed)
Referring Provider: Sharmon Leyden* Primary Care Physician:  Doreen Beam, FNP Primary GI:  Dr. Abbey Chatters  Chief Complaint  Patient presents with  . Gastroesophageal Reflux  . Abdominal Pain    "all over" but mostly mid upper abd    HPI:   Courtney Spencer is a 25 y.o. female who presents on referral from primary care for GERD symptoms.  The patient has not been seen in our office since 08/16/2015 when she was seen for abdominal pain and diarrhea.  Status post laparoscopic cholecystectomy 4/85/4627 without complications.  Abdominal pain resolved after then 1 with diarrhea, but is since returned although diarrhea not severe.  Only occurring every 2 to 3 weeks.  Recommended continue Levsin, probiotic daily, follow-up in 3 months.  Patient was not seen and appears to been lost to follow-up.  Reviewed information associated with her current referral including office visit 02/22/2020 when she was seen for multiple primary care issues including abdominal pain and GERD.  At that time noted she was seen for GERD 3 months prior, started on omeprazole 20 mg and notes "fair compliance".  She does note fullness after meals, mid epigastric pain, heartburn, nausea, frequent throat clearing and regurgitation of undigested food.  She was on Prilosec 20 mg daily at that time.  Recommended H. pylori antigen, CBC, CMP, referral to GI.  CBC and CMP were completed 02/22/2020, both of which were completely normal.  Does not appear that the H. pylori antigen/stool was completed.  Today she states doing okay overall. Since she has been on PPI, she has not had any significant improvement. Symptoms worse in the early morning and later at night. Only taking it once a day. Having abdominal pain, mostly upper abdomen/epigastric described as burning. Her more generalized pain seems linked to her GERD symptoms and epigastric pain based on her description. Generalized pain doesn't improved with a  bowel movement. Notes bitter taste, esophageal burning. Has intermittent nausea but no vomiting. Bowel movements are mostly loose which is chronic for her. She was prescribed a medication by PCP but hasn't been able to pick it up yet. Denies hematochezia, melena. Admits occasional NSAIDs, about once a month. Denies ASA powders. Denies fever, chills, unintentional weight loss. Denies URI or flu-like symptoms. Denies loss of sense of taste or smell. The patient has received COVID-19 vaccination(s). Denies chest pain, dyspnea, dizziness, lightheadedness, syncope, near syncope. Denies any other upper or lower GI symptoms.  Interestingly, as related to generalized/lower abdominal pain, she has been diagnosed with endometriosis and they are trying to manage that with birth control; management is ongoing.  Past Medical History:  Diagnosis Date  . Allergy   . Anxiety   . Cat scratch fever   . Depression   . Endometriosis   . GERD (gastroesophageal reflux disease)   . Heart murmur of newborn   . Helicobacter pylori ab+    H.pylori serology positive in Jan 2015, treated with Amoxicillin and Flagyl at that time. Returned to clinic Feb 2015 and had rechecked, with a different provider treating with Amoxicillin and Biaxin. H.pylori stool antigen ordered April 2015 and negative.  . Hematuria 08/09/2013  . History of kidney stones   . Kidney stones 10/26/2013  . Migraines   . Other and unspecified ovarian cyst 08/09/2013  . Ovarian cyst   . UTI (lower urinary tract infection)     Past Surgical History:  Procedure Laterality Date  . CHOLECYSTECTOMY N/A 05/12/2015   Procedure: LAPAROSCOPIC  CHOLECYSTECTOMY;  Surgeon: Aviva Signs, MD;  Location: [facility];  Service: General;  Laterality: N/A;  . ESOPHAGOGASTRODUODENOSCOPY N/A 08/13/2013   Dr. Oneida Alar: nodular gastritis, negative H.pylori  . WISDOM TOOTH EXTRACTION      Current Outpatient Medications  Medication Sig Dispense Refill  . fluconazole (DIFLUCAN)  150 MG tablet Take one tablet at the onset of symptoms. If symptoms are still present 3 days later, take the second tablet. 2 tablet 0  . glycopyrrolate (ROBINUL) 1 MG tablet Take 1 mg by mouth 2 (two) times daily.    . norethindrone (MICRONOR) 0.35 MG tablet Take 1 tablet (0.35 mg total) by mouth daily. 28 tablet 11  . omeprazole (PRILOSEC) 40 MG capsule Take 1 capsule (40 mg total) by mouth daily. 30 capsule 1  . Vitamin D, Ergocalciferol, (DRISDOL) 1.25 MG (50000 UNIT) CAPS capsule Take 1 capsule (50,000 Units total) by mouth every 7 (seven) days. (taking one tablet per week) walk in lab in office 1-2 weeks after completing prescription. 12 capsule 0   No current facility-administered medications for this visit.    Allergies as of 04/06/2020  . (No Known Allergies)    Family History  Problem Relation Age of Onset  . Gallbladder disease Mother        gallbladder was removed  . Migraines Mother   . Migraines Sister   . Hypertension Sister   . ADD / ADHD Brother   . Hypertension Maternal Grandmother   . Cancer Maternal Grandfather        lung  . Cancer Paternal Grandfather        lung  . Seizures Sister   . Scoliosis Sister   . ADD / ADHD Brother   . Colon cancer Other        unknown    Social History   Socioeconomic History  . Marital status: Married    Spouse name: Not on file  . Number of children: Not on file  . Years of education: Not on file  . Highest education level: Not on file  Occupational History  . 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   Tobacco Use  . Smoking status: Never Smoker  . Smokeless tobacco: Never Used  . Tobacco comment: Never really smoked  Vaping Use  . Vaping Use: Never used  Substance and Sexual Activity  . Alcohol use: No    Alcohol/week: 0.0 standard drinks  . Drug use: No  . Sexual activity: Yes    Birth control/protection: Pill  Other Topics Concern   . Not on file  Social History Narrative  . Not on file   Social Determinants of Health   Financial Resource Strain: Low Risk   . Difficulty of Paying Living Expenses: Not hard at all  Food Insecurity: No Food Insecurity  . Worried About Charity fundraiser in the Last Year: Never true  . Ran Out of Food in the Last Year: Never true  Transportation Needs: No Transportation Needs  . Lack of Transportation (Medical): No  . Lack of Transportation (Non-Medical): No  Physical Activity: Insufficiently Active  . Days of Exercise per Week: 2 days  . Minutes of Exercise per Session: 30 min  Stress: Stress Concern Present  . Feeling of Stress : Very much  Social Connections: Moderately Isolated  . Frequency of Communication with Friends and Family: More than three times a week  . Frequency of  Social Gatherings with Friends and Family: More than three times a week  . Attends Religious Services: Never  . Active Member of Clubs or Organizations: No  . Attends Archivist Meetings: Never  . Marital Status: Married    Subjective: Review of Systems  Constitutional: Negative for chills, fever, malaise/fatigue and weight loss.  HENT: Negative for congestion and sore throat.   Respiratory: Negative for cough and shortness of breath.   Cardiovascular: Negative for chest pain and palpitations.  Gastrointestinal: Positive for abdominal pain, heartburn and nausea. Negative for blood in stool, constipation, diarrhea, melena and vomiting.  Musculoskeletal: Negative for joint pain and myalgias.  Skin: Negative for rash.  Neurological: Negative for dizziness and weakness.  Endo/Heme/Allergies: Does not bruise/bleed easily.  Psychiatric/Behavioral: Negative for depression. The patient is not nervous/anxious.   All other systems reviewed and are negative.    Objective: BP 108/75   Pulse 80   Temp (!) 97.3 F (36.3 C) (Temporal)   Ht _0  (1.499 m)   Wt 194 lb 3.2 oz (88.1 kg)   LMP  03/30/2020 (Approximate)   BMI 39.22 kg/m  Physical Exam Vitals and nursing note reviewed.  Constitutional:      General: She is not in acute distress.    Appearance: Normal appearance. She is well-developed. She is obese. She is not ill-appearing, toxic-appearing or diaphoretic.  HENT:     Head: Normocephalic and atraumatic.     Nose: No congestion or rhinorrhea.  Eyes:     General: No scleral icterus. Cardiovascular:     Rate and Rhythm: Normal rate and regular rhythm.     Heart sounds: Normal heart sounds.  Pulmonary:     Effort: Pulmonary effort is normal. No respiratory distress.     Breath sounds: Normal breath sounds.  Abdominal:     General: Bowel sounds are normal.     Palpations: Abdomen is soft. There is no hepatomegaly, splenomegaly or mass.     Tenderness: There is abdominal tenderness in the epigastric area. There is no guarding or rebound.     Hernia: No hernia is present.  Skin:    General: Skin is warm and dry.     Coloration: Skin is not jaundiced.     Findings: No rash.  Neurological:     General: No focal deficit present.     Mental Status: She is alert and oriented to person, place, and time.  Psychiatric:        Attention and Perception: Attention normal.        Mood and Affect: Mood normal.        Speech: Speech normal.        Behavior: Behavior normal.        Thought Content: Thought content normal.        Cognition and Memory: Cognition and memory normal.      Assessment:  Very pleasant 25 year old female presents for worsening GERD/abdominal pain unrelieved by PPI. Remote history of H. pylori gastritis in 2015 treated with antibiotics. She began having worsening symptoms about 3 to 4 months ago, was started on omeprazole 20 mg daily. This is since increased to 40 mg daily. However, she has not had significant relief from this. She has persistent midepigastric pain, typical GERD symptoms such as esophageal burning, bitter taste, nausea. She has been  on a PPI for several months. In the interest of symptom management I will increase this to 40 mg twice a day for now. I feel it  is worth proceeding with an upper endoscopy at this time as well to evaluate for recurrent H. pylori, erosions, ulcerations, mucosal damage from NSAID gastritis. In the meantime she can call us for any worsening symptoms. We will follow-up in about 2 months to evaluate her progress.   Proceed with EGD on propofol/MAC with Dr. Abbey Chatters on propofol/MAC in near future: the risks, benefits, and alternatives have been discussed with the patient in detail. The patient states understanding and desires to proceed.  ASA II   Plan: 1. Increase omeprazole to 40 mg twice a day 2. EGD as described above 3. Call for any worsening 4. Follow-up in 2 months    Thank you for allowing [place] to participate in the care of [place], DNP, AGNP-C Adult & Gerontological Nurse Practitioner [facility]   04/06/2020 3:38 PM   Disclaimer: This note was dictated with voice recognition software. Similar sounding words can inadvertently be transcribed and may not be corrected upon review.

## 2020-04-06 NOTE — Patient Instructions (Signed)
Your health issues we discussed today were:   GERD (reflux/heartburn) with upper abdominal pain: 1. As we discussed, we will schedule an upper endoscopy to further evaluate the stomach lining, swallowing tube, first part of your small intestines for any causes of your symptoms. 2. In the meantime, to help you feel better, I will increase your omeprazole (Prilosec) to 40 mg twice daily. Take this pursing in the morning before you eat and 30 minutes for your last meal the day 3. You can simply take your current medication twice a day. 4. I sent a new prescription to your pharmacy that you can have filled because you will likely run out of your current medicine sooner than expected 5. Call us for any worsening or severe symptoms  Overall I recommend:  1. Continue your other current medications 2. Return for follow-up in 2 months 3. Call us for any questions or concerns   ---------------------------------------------------------------  I am glad you have gotten your COVID-19 vaccination!  Even though you are fully vaccinated you should continue to follow CDC and state/local guidelines.  ---------------------------------------------------------------   At [facility] we value your feedback. You may receive a survey about your visit today. Please share your experience as we strive to create trusting relationships with our patients to provide genuine, compassionate, quality care.  We appreciate your understanding and patience as we review any laboratory studies, imaging, and other diagnostic tests that are ordered as we care for you. Our office policy is 5 business days for review of these results, and any emergent or urgent results are addressed in a timely manner for your best interest. If you do not hear from our office in 1 week, please contact us.   We also encourage the use of MyChart, which contains your medical information for your review as well. If you are not enrolled in  this feature, an access code is on this after visit summary for your convenience. Thank you for allowing [place] to be involved in your care.  It was great to see you today!  I hope you have a safe and warm winter!!

## 2020-04-17 ENCOUNTER — Ambulatory Visit: Payer: 59 | Admitting: Adult Health

## 2020-04-17 ENCOUNTER — Telehealth: Payer: Self-pay

## 2020-04-17 NOTE — Telephone Encounter (Signed)
Pt called office, she was diagnosed with kidney stones today. Was prescribed Keflex 500mg  qid, Tamsulosin, Zofran and Hydrocodone 5/325 prn. She is scheduled for EGD 04/21/20. She wants to know if she can still have EGD? She's aware 04/23/20 is off today and will return tomorrow.

## 2020-04-18 NOTE — Telephone Encounter (Signed)
I reviewed her chart in CareEverywhere. UA with no bacteria, CBC with no leucocytosis. Even though she's on antibiotics and Flomax, she should be ok for EGD in 3 days if she feels well enough. Obviously if she's having bad pain and lots of vomiting, she may want to hold off.  Cc: Dr. Marletta Lor for [facility] and any other recommendations.

## 2020-04-18 NOTE — Telephone Encounter (Signed)
Called and informed pt of Eric's recommendation.

## 2020-04-19 NOTE — Telephone Encounter (Signed)
Called and informed pt.  

## 2020-04-19 NOTE — Telephone Encounter (Signed)
Okay to proceed with EGD from my standpoint if she is feeling better.  Thanks for taking care of this Minerva Areola

## 2020-04-20 ENCOUNTER — Other Ambulatory Visit: Payer: Self-pay

## 2020-04-20 ENCOUNTER — Other Ambulatory Visit: Payer: Self-pay | Admitting: Adult Health

## 2020-04-20 ENCOUNTER — Ambulatory Visit: Payer: Self-pay | Admitting: *Deleted

## 2020-04-20 ENCOUNTER — Other Ambulatory Visit (HOSPITAL_COMMUNITY)
Admission: RE | Admit: 2020-04-20 | Discharge: 2020-04-20 | Disposition: A | Discharge status: Home / Self Care | Payer: 59 | Source: Ambulatory Visit | Attending: Internal Medicine | Admitting: Internal Medicine

## 2020-04-20 DIAGNOSIS — N200 Calculus of kidney: Secondary | ICD-10-CM

## 2020-04-20 DIAGNOSIS — Z01812 Encounter for preprocedural laboratory examination: Secondary | ICD-10-CM | POA: Diagnosis not present

## 2020-04-20 DIAGNOSIS — Z20822 Contact with and (suspected) exposure to covid-19: Secondary | ICD-10-CM | POA: Insufficient documentation

## 2020-04-20 DIAGNOSIS — R319 Hematuria, unspecified: Secondary | ICD-10-CM

## 2020-04-20 LAB — PREGNANCY, URINE: Preg Test, Ur: NEGATIVE

## 2020-04-20 LAB — SARS CORONAVIRUS 2 (TAT 6-24 HRS): SARS Coronavirus 2: NEGATIVE

## 2020-04-20 NOTE — Telephone Encounter (Signed)
Pt returned call and was given Michelle's message.  This is also in a documentation note.   She does want a urologist referral whoever Marcelino Duster recommends.    Yes she is on cephalexin Not as much pain today.   Not taken any pain med. Today.   Still some pain in lower back and belly.

## 2020-04-20 NOTE — Telephone Encounter (Signed)
Pt called in and was given the message from Marvell Fuller, FNP.dated 04/20/2020 at 1:52 PM.  She said she is not having as much pain today but yesterday she did. Still having some pain in lower back and belly. She has not had to take any pain medication today. She is on an antibiotic, cephalexin  She does want a referral to a urologist whoever Marcelino Duster recommends.  I sent this message to [facility] for [place].

## 2020-04-20 NOTE — Telephone Encounter (Signed)
Being she is scheduled for a gastric procedure and not related to her symptoms. She would need to be evaluated in the office or UC if symptoms worsening. She did have urinary infection per note at [facility] and  kidney stones is she on an antibiotic  ?   If she is having increasing pain she should be seen Pregnancy test was negative in the emergency room.  If she does not have urologist ok to place urgent referral.

## 2020-04-20 NOTE — Telephone Encounter (Signed)
Order was placed for urology she should hear within 1 week.  Seek care immediately if worsening at anytime.

## 2020-04-20 NOTE — Telephone Encounter (Signed)
lmtcb okay for [facility] triage to advise patient of message below. KW

## 2020-04-20 NOTE — Telephone Encounter (Signed)
C/o continued left back and flank pain from kidney stone. Patient went to ED Monday and dx with kidney stone and now noted bleeding with every wipe. Patient reports she is not supposed to start period until next week per her "app" to track menstrual cycle. C/o she has noticed bleeding after every wipe after urinating. Denies fever, pain or burning urinating, no blood clots noted after wiping , denies dizziness. Patient is scheduled for an upcoming procedure and was covid tested and does not want to come in for OV or go back to ED if possible. Care advise given. Patient verbalized understanding of care advise and to call back or go to ED if symptoms worsen.   Reason for Disposition . Side (flank) or back pain present  Answer Assessment - Initial Assessment Questions 1. COLOR of URINE: "Describe the color of the urine."  (e.g., tea-colored, pink, red, blood clots, bloody)     na 2. ONSET: "When did the bleeding start?"      This am  3. EPISODES: "How many times has there been blood in the urine?" or "How many times today?"     This am . Reports blood noted when she wipes  4. PAIN with URINATION: "Is there any pain with passing your urine?" If Yes, ask: "How bad is the pain?"  (Scale 1-10; or mild, moderate, severe)    - MILD - complains slightly about urination hurting    - MODERATE - interferes with normal activities      - SEVERE - excruciating, unwilling or unable to urinate because of the pain      No  5. FEVER: "Do you have a fever?" If Yes, ask: "What is your temperature, how was it measured, and when did it start?"     no 6. ASSOCIATED SYMPTOMS: "Are you passing urine more frequently than usual?"     Na, diagnosed with kidney stone Monday in ED 7. OTHER SYMPTOMS: "Do you have any other symptoms?" (e.g., back/flank pain, abdominal pain, vomiting)     Left back and flank pain, low abdominal pain . Now noted bleeding with every wipe  8. PREGNANCY: "Is there any chance you are pregnant?"  "When was your last menstrual period?"     Na. Scheduled to start period next week per "App" patient uses to track menstrual cycle  Protocols used: URINE - BLOOD IN-A-AH

## 2020-04-21 ENCOUNTER — Encounter (HOSPITAL_COMMUNITY): Payer: Self-pay

## 2020-04-21 ENCOUNTER — Other Ambulatory Visit: Payer: Self-pay

## 2020-04-21 ENCOUNTER — Telehealth: Payer: Self-pay | Admitting: Adult Health

## 2020-04-21 ENCOUNTER — Ambulatory Visit (HOSPITAL_COMMUNITY)
Admission: RE | Admit: 2020-04-21 | Discharge: 2020-04-21 | Disposition: A | Discharge status: Home / Self Care | Payer: 59 | Attending: Internal Medicine | Admitting: Internal Medicine

## 2020-04-21 ENCOUNTER — Encounter (HOSPITAL_COMMUNITY)
Admission: RE | Disposition: A | Discharge status: Home / Self Care | Payer: Self-pay | Source: Home / Self Care | Attending: Internal Medicine

## 2020-04-21 ENCOUNTER — Ambulatory Visit (HOSPITAL_COMMUNITY): Payer: 59 | Admitting: Certified Registered Nurse Anesthetist

## 2020-04-21 DIAGNOSIS — K449 Diaphragmatic hernia without obstruction or gangrene: Secondary | ICD-10-CM | POA: Insufficient documentation

## 2020-04-21 DIAGNOSIS — K2970 Gastritis, unspecified, without bleeding: Secondary | ICD-10-CM | POA: Diagnosis not present

## 2020-04-21 DIAGNOSIS — K219 Gastro-esophageal reflux disease without esophagitis: Secondary | ICD-10-CM | POA: Insufficient documentation

## 2020-04-21 DIAGNOSIS — K2950 Unspecified chronic gastritis without bleeding: Secondary | ICD-10-CM | POA: Diagnosis not present

## 2020-04-21 DIAGNOSIS — R12 Heartburn: Secondary | ICD-10-CM | POA: Diagnosis present

## 2020-04-21 DIAGNOSIS — Z79899 Other long term (current) drug therapy: Secondary | ICD-10-CM | POA: Insufficient documentation

## 2020-04-21 DIAGNOSIS — R1013 Epigastric pain: Secondary | ICD-10-CM | POA: Diagnosis present

## 2020-04-21 HISTORY — PX: BIOPSY: SHX5522

## 2020-04-21 HISTORY — PX: ESOPHAGOGASTRODUODENOSCOPY (EGD) WITH PROPOFOL: SHX5813

## 2020-04-21 SURGERY — ESOPHAGOGASTRODUODENOSCOPY (EGD) WITH PROPOFOL
Anesthesia: General

## 2020-04-21 MED ORDER — LACTATED RINGERS IV SOLN: INTRAVENOUS | Status: DC

## 2020-04-21 MED ORDER — PROPOFOL 10 MG/ML IV BOLUS
INTRAVENOUS | Status: DC | PRN
  Administered 2020-04-21: 50 mg via INTRAVENOUS
  Administered 2020-04-21: 100 mg via INTRAVENOUS

## 2020-04-21 MED ORDER — STERILE WATER FOR IRRIGATION IR SOLN
Status: DC | PRN
  Administered 2020-04-21: 100 mL

## 2020-04-21 MED ORDER — LIDOCAINE HCL (CARDIAC) PF 100 MG/5ML IV SOSY
PREFILLED_SYRINGE | INTRAVENOUS | Status: DC | PRN
  Administered 2020-04-21: 50 mg via INTRAVENOUS

## 2020-04-21 NOTE — Transfer of Care (Signed)
Immediate Anesthesia Transfer of Care Note  Patient: Courtney Spencer  Procedure(s) Performed: ESOPHAGOGASTRODUODENOSCOPY (EGD) WITH PROPOFOL (N/A ) BIOPSY  Patient Location: PACU  Anesthesia Type:General  Level of Consciousness: awake, alert  and oriented  Airway & Oxygen Therapy: Patient Spontanous Breathing  Post-op Assessment: Report given to RN and Post -op Vital signs reviewed and stable  Post vital signs: Reviewed and stable  Last Vitals:  Vitals Value Taken Time  BP    Temp    Pulse    Resp    SpO2      Last Pain:  Vitals:   04/21/20 0837  TempSrc:   PainSc: 0-No pain      Patients Stated Pain Goal: 0 (04/21/20 0735)  Complications: No complications documented.

## 2020-04-21 NOTE — Discharge Instructions (Addendum)
EGD Discharge instructions Please read the instructions outlined below and refer to this sheet in the next few weeks. These discharge instructions provide you with general information on caring for yourself after you leave the hospital. Your doctor may also give you specific instructions. While your treatment has been planned according to the most current medical practices available, unavoidable complications occasionally occur. If you have any problems or questions after discharge, please call your doctor. ACTIVITY  You may resume your regular activity but move at a slower pace for the next 24 hours.   Take frequent rest periods for the next 24 hours.   Walking will help expel (get rid of) the air and reduce the bloated feeling in your abdomen.   No driving for 24 hours (because of the anesthesia (medicine) used during the test).   You may shower.   Do not sign any important legal documents or operate any machinery for 24 hours (because of the anesthesia used during the test).  NUTRITION  Drink plenty of fluids.   You may resume your normal diet.   Begin with a light meal and progress to your normal diet.   Avoid alcoholic beverages for 24 hours or as instructed by your caregiver.  MEDICATIONS  You may resume your normal medications unless your caregiver tells you otherwise.  WHAT YOU CAN EXPECT TODAY  You may experience abdominal discomfort such as a feeling of fullness or "gas" pains.  FOLLOW-UP  Your doctor will discuss the results of your test with you.  SEEK IMMEDIATE MEDICAL ATTENTION IF ANY OF THE FOLLOWING OCCUR:  Excessive nausea (feeling sick to your stomach) and/or vomiting.   Severe abdominal pain and distention (swelling).   Trouble swallowing.   Temperature over 101 F (37.8 C).   Rectal bleeding or vomiting of blood.    .  Your EGD showed a mild amount of inflammation in your stomach.  I biopsied this to rule out infection.  Also took biopsies of your  esophagus and small bowel to rule out underlying disorders.  Await pathology results, my office will contact you next week.  I want you to take omeprazole 40 mg twice daily for the next 8 weeks and decrease back to once daily thereafter.  Follow-up with GI in 3 months or sooner if needed.   I hope you have a great rest of your week!  Hennie Duos. Marletta Lor, D.O. Gastroenterology and Hepatology [facility]   Gastritis, Adult  Gastritis is swelling (inflammation) of the stomach. Gastritis can develop quickly (acute). It can also develop slowly over time (chronic). It is important to get help for this condition. If you do not get help, your stomach can bleed, and you can get sores (ulcers) in your stomach. What are the causes? This condition may be caused by:  Germs that get to your stomach.  Drinking too much alcohol.  Medicines you are taking.  Too much acid in the stomach.  A disease of the intestines or stomach.  Stress.  An allergic reaction.  Crohn's disease.  Some cancer treatments (radiation). Sometimes the cause of this condition is not known. What are the signs or symptoms? Symptoms of this condition include:  Pain in your stomach.  A burning feeling in your stomach.  Feeling sick to your stomach (nauseous).  Throwing up (vomiting).  Feeling too full after you eat.  Weight loss.  Bad breath.  Throwing up blood.  Blood in your poop (stool). How is this diagnosed? This condition may  be diagnosed with:  Your medical history and symptoms.  A physical exam.  Tests. These can include: ? Blood tests. ? Stool tests. ? A procedure to look inside your stomach (upper endoscopy). ? A test in which a sample of tissue is taken for testing (biopsy). How is this treated? Treatment for this condition depends on what caused it. You may be given:  Antibiotic medicine, if your condition was caused by germs.  H2 blockers and similar  medicines, if your condition was caused by too much acid. Follow these instructions at home: Medicines  Take over-the-counter and prescription medicines only as told by your doctor.  If you were prescribed an antibiotic medicine, take it as told by your doctor. Do not stop taking it even if you start to feel better. Eating and drinking  Eat small meals often, instead of large meals.  Avoid foods and drinks that make your symptoms worse.  Drink enough fluid to keep your pee (urine) pale yellow.   Alcohol use  Do not drink alcohol if: ? Your doctor tells you not to drink. ? You are pregnant, may be pregnant, or are planning to become pregnant.  If you drink alcohol: ? Limit your use to:  0-1 drink a day for women.  0-2 drinks a day for men. ? Be aware of how much alcohol is in your drink. In the [place] one drink equals one 12 oz bottle of beer (355 mL), one 5 oz glass of wine (148 mL), or one 1 oz glass of hard liquor (44 mL). General instructions  Talk with your doctor about ways to manage stress. You can exercise or do deep breathing, meditation, or yoga.  Do not smoke or use products that have nicotine or tobacco. If you need help quitting, ask your doctor.  Keep all follow-up visits as told by your doctor. This is important. Contact a doctor if:  Your symptoms get worse.  Your symptoms go away and then come back. Get help right away if:  You throw up blood or something that looks like coffee grounds.  You have black or dark red poop.  You throw up any time you try to drink fluids.  Your stomach pain gets worse.  You have a fever.  You do not feel better after one week. Summary  Gastritis is swelling (inflammation) of the stomach.  You must get help for this condition. If you do not get help, your stomach can bleed, and you can get sores (ulcers).  This condition is diagnosed with medical history, physical exam, or tests.  You can be treated with medicines  for germs or medicines to block too much acid in your stomach. This information is not intended to replace advice given to you by your health care provider. Make sure you discuss any questions you have with your health care provider. Document Revised: 07/01/2017 Document Reviewed: 07/01/2017 Elsevier Patient Education  2021 [employer].

## 2020-04-21 NOTE — Interval H&P Note (Signed)
History and Physical Interval Note:  04/21/2020 8:08 AM  Courtney Spencer  has presented today for surgery, with the diagnosis of gerd, abd pain.  The various methods of treatment have been discussed with the patient and family. After consideration of risks, benefits and other options for treatment, the patient has consented to  Procedure(s) with comments: ESOPHAGOGASTRODUODENOSCOPY (EGD) WITH PROPOFOL (N/A) - 11:00am as a surgical intervention.  The patient's history has been reviewed, patient examined, no change in status, stable for surgery.  I have reviewed the patient's chart and labs.  Questions were answered to the patient's satisfaction.     Lanelle Bal

## 2020-04-21 NOTE — Telephone Encounter (Signed)
Pt is calling to request a referral for Urology in [place]. Please advise CB- [phone]

## 2020-04-21 NOTE — Anesthesia Preprocedure Evaluation (Signed)
Anesthesia Evaluation  Patient identified by MRN, date of birth, ID band Patient awake    Reviewed: Allergy & Precautions, H&P , NPO status , Patient's Chart, lab work & pertinent test results, reviewed documented beta blocker date and time   Airway Mallampati: II  TM Distance: >3 FB Neck ROM: full    Dental no notable dental hx. (+) Teeth Intact   Pulmonary neg pulmonary ROS,    Pulmonary exam normal breath sounds clear to auscultation       Cardiovascular Exercise Tolerance: Good negative cardio ROS   Rhythm:regular Rate:Normal     Neuro/Psych  Headaches, PSYCHIATRIC DISORDERS Anxiety Depression    GI/Hepatic Neg liver ROS, GERD  Medicated,  Endo/Other  negative endocrine ROS  Renal/GU negative Renal ROS  negative genitourinary   Musculoskeletal   Abdominal   Peds  Hematology negative hematology ROS (+)   Anesthesia Other Findings   Reproductive/Obstetrics negative OB ROS                             Anesthesia Physical Anesthesia Plan  ASA: II  Anesthesia Plan: General   Post-op Pain Management:    Induction:   PONV Risk Score and Plan: Propofol infusion  Airway Management Planned:   Additional Equipment:   Intra-op Plan:   Post-operative Plan:   Informed Consent: I have reviewed the patients History and Physical, chart, labs and discussed the procedure including the risks, benefits and alternatives for the proposed anesthesia with the patient or authorized representative who has indicated his/her understanding and acceptance.     Dental Advisory Given  Plan Discussed with: CRNA  Anesthesia Plan Comments:         Anesthesia Quick Evaluation

## 2020-04-21 NOTE — Telephone Encounter (Signed)
Please advise 

## 2020-04-21 NOTE — Telephone Encounter (Signed)
Ok to place referral to [facility] for urology related to kidney stones.  Referral was placed yesterday as patient did not have preference.  See note below. It was placed as urgent.

## 2020-04-21 NOTE — Op Note (Signed)
[facility] Patient Name: Courtney Spencer Procedure Date: 04/21/2020 8:27 AM MRN: [id number] Date of Birth: 22-Jan-1996 Attending MD: Elon Alas. Abbey Chatters DO CSN: [id number] Age: 25 Admit Type: Outpatient Procedure:                Upper GI endoscopy Indications:              Epigastric abdominal pain, Heartburn Providers:                Elon Alas. Abbey Chatters, DO, Lambert Mody, Dereck Leep, Technician Referring MD:              Medicines:                See the Anesthesia note for documentation of the                            administered medications Complications:            No immediate complications. Estimated Blood Loss:     Estimated blood loss was minimal. Procedure:                Pre-Anesthesia Assessment:                           - The anesthesia plan was to use monitored                            anesthesia care (MAC).                           After obtaining informed consent, the endoscope was                            passed under direct vision. Throughout the                            procedure, the patient's blood pressure, pulse, and                            oxygen saturations were monitored continuously. The                            [id number]-H190 ([id number]) scope was introduced through the                            mouth, and advanced to the second part of duodenum.                            The upper GI endoscopy was accomplished without                            difficulty. The patient tolerated the procedure                            well. Scope  In: 8:40:25 AM Scope Out: 8:44:37 AM Total Procedure Duration: 0 hours 4 minutes 12 seconds  Findings:      A small hiatal hernia was present.      Biopsies were taken with a cold forceps in the middle third of the       esophagus for histology.      Localized mild inflammation characterized by erythema was found in the       gastric antrum. Biopsies were taken  with a cold forceps for Helicobacter       pylori testing.      The duodenal bulb, first portion of the duodenum and second portion of       the duodenum were normal. Biopsies for histology were taken with a cold       forceps for evaluation of celiac disease. Impression:               - Small hiatal hernia.                           - Gastritis. Biopsied.                           - Normal duodenal bulb, first portion of the                            duodenum and second portion of the duodenum.                            Biopsied.                           - Biopsies were taken with a cold forceps for                            histology in the middle third of the esophagus. Moderate Sedation:      Per Anesthesia Care Recommendation:           - Patient has a contact number available for                            emergencies. The signs and symptoms of potential                            delayed complications were discussed with the                            patient. Return to normal activities tomorrow.                            Written discharge instructions were provided to the                            patient.                           - Resume previous diet.                           -  Continue present medications.                           - Await pathology results.                           - Return to GI clinic in 3 months.                           - Use Prilosec (omeprazole) 40 mg PO BID for 8                            weeks then decrease down to once daily. Procedure Code(s):        --- Professional ---                           [phone], Esophagogastroduodenoscopy, flexible,                            transoral; with biopsy, single or multiple Diagnosis Code(s):        --- Professional ---                           K44.9, Diaphragmatic hernia without obstruction or                            gangrene                           K29.70, Gastritis, unspecified, without  bleeding                           R10.13, Epigastric pain                           R12, Heartburn CPT copyright 2019 American Medical Association. All rights reserved. The codes documented in this report are preliminary and upon coder review may  be revised to meet current compliance requirements. Elon Alas. Abbey Chatters, DO [name], DO 04/21/2020 8:51:31 AM This report has been signed electronically. Number of Addenda: 0

## 2020-04-21 NOTE — Anesthesia Postprocedure Evaluation (Signed)
Anesthesia Post Note  Patient: Courtney Spencer  Procedure(s) Performed: ESOPHAGOGASTRODUODENOSCOPY (EGD) WITH PROPOFOL (N/A ) BIOPSY  Patient location during evaluation: Phase II Anesthesia Type: General Level of consciousness: awake and alert and oriented Pain management: satisfactory to patient Vital Signs Assessment: post-procedure vital signs reviewed and stable Respiratory status: spontaneous breathing and respiratory function stable Cardiovascular status: stable and blood pressure returned to baseline Postop Assessment: no apparent nausea or vomiting Anesthetic complications: no   No complications documented.   Last Vitals:  Vitals:   04/21/20 0735  BP: 99/68  Pulse: 78  Resp: (!) 22  Temp: 37.1 C  SpO2: 99%    Last Pain:  Vitals:   04/21/20 0837  TempSrc:   PainSc: 0-No pain                 Lorin Glass

## 2020-04-26 ENCOUNTER — Encounter (HOSPITAL_COMMUNITY): Payer: Self-pay | Admitting: Internal Medicine

## 2020-04-26 LAB — SURGICAL PATHOLOGY

## 2020-04-27 ENCOUNTER — Other Ambulatory Visit: Payer: Self-pay | Admitting: Adult Health

## 2020-04-27 ENCOUNTER — Ambulatory Visit: Payer: Self-pay

## 2020-04-27 NOTE — Telephone Encounter (Signed)
Please see triage note and patient response below and advise if appropriate to prescribe nausea Rx? KW

## 2020-04-27 NOTE — Telephone Encounter (Signed)
It appears she has seen Gastroenterology and just had recent endoscopy done as well, she needs to follow up with office and urology.  Not comfortable sending in another anti nauseas medicine if the Zofran she is taking is not helping she could have worsening side effects or even complications from Endoscopy. She is currently on Zofran- seek care immediately at anytime if worsening and be seen by urology as soon as possible is advised.

## 2020-04-27 NOTE — Telephone Encounter (Signed)
Pt. Reports she was seen at [facility] last week with kidney stones. Left flank pain and nausea has returned. States she will call urology today for an appointment.Staes the medication for nausea they gave her does not help. Cannot remember the name of medication. Asking for another medication to be called in. Will try to find the medicine name and call back. Please advise. Declines appointment. Answer Assessment - Initial Assessment Questions 1. LOCATION: "Where does it hurt?" (e.g., left, right)     Left flank 2. ONSET: "When did the pain start?"     04/17/20  3. SEVERITY: "How bad is the pain?" (e.g., Scale 1-10; mild, moderate, or severe)   - MILD (1-3): doesn't interfere with normal activities    - MODERATE (4-7): interferes with normal activities or awakens from sleep    - SEVERE (8-10): excruciating pain and patient unable to do normal activities (stays in bed)       6 4. PATTERN: "Does the pain come and go, or is it constant?"      Comes and goes 5. CAUSE: "What do you think is causing the pain?"     Kidney stone 6. OTHER SYMPTOMS:  "Do you have any other symptoms?" (e.g., fever, abdominal pain, vomiting, leg weakness, burning with urination, blood in urine)     Nausea 7. PREGNANCY:  "Is there any chance you are pregnant?" "When was your last menstrual period?"     No  Protocols used: FLANK PAIN-A-AH

## 2020-04-28 NOTE — Telephone Encounter (Signed)
Patient was advised she states that she will try to go to urgent care if she can this weekend. She states that she cannot make a follow up appt and that she has to give employer two weeks notice. KW

## 2020-04-28 NOTE — Telephone Encounter (Signed)
Noted  

## 2020-04-28 NOTE — Telephone Encounter (Signed)
Advise her to go be seen at [facility] urgent care or urgent care of choice now for evaluation  and schedule follow up next week. Since it is Friday afternoon 04/28/20 448pm and no office visits open now

## 2020-04-28 NOTE — Telephone Encounter (Signed)
Patient was advised and in agreeance, she states that flank pain has went away. She states that she knew that she had kidney stones and states that she noticed blood in her urine, she states that she still has blood in urine and has been present for 10 days. Patient states that urology appt isnt until 05/19/20. She states the day shes scheduled for appt she is unable to get off work and states that it would have to be reschedule but states that receptionist at [facility] stated they had not received referral for urology and advised her that there is no open appts until [place]. KW

## 2020-04-30 ENCOUNTER — Ambulatory Visit: Payer: Self-pay

## 2020-05-19 ENCOUNTER — Ambulatory Visit
Admission: RE | Admit: 2020-05-19 | Discharge: 2020-05-19 | Disposition: A | Discharge status: Home / Self Care | Payer: 59 | Source: Ambulatory Visit | Attending: Urology | Admitting: Urology

## 2020-05-19 ENCOUNTER — Encounter: Payer: Self-pay | Admitting: Urology

## 2020-05-19 ENCOUNTER — Other Ambulatory Visit: Payer: Self-pay

## 2020-05-19 ENCOUNTER — Ambulatory Visit: Payer: 59 | Admitting: Urology

## 2020-05-19 ENCOUNTER — Telehealth: Payer: Self-pay

## 2020-05-19 VITALS — BP 112/78 | HR 85 | Ht 59.0 in | Wt 190.0 lb

## 2020-05-19 DIAGNOSIS — N200 Calculus of kidney: Secondary | ICD-10-CM

## 2020-05-19 DIAGNOSIS — N201 Calculus of ureter: Secondary | ICD-10-CM

## 2020-05-19 LAB — MICROSCOPIC EXAMINATION: Epithelial Cells (non renal): >10 /hpf — AB (ref 0–10)

## 2020-05-19 LAB — URINALYSIS, COMPLETE
Bilirubin, UA: NEGATIVE
Glucose, UA: NEGATIVE
Ketones, UA: NEGATIVE
Leukocytes,UA: NEGATIVE
Nitrite, UA: NEGATIVE
Protein,UA: NEGATIVE
Specific Gravity, UA: >1.03 — ABNORMAL HIGH (ref 1.005–1.030)
Urobilinogen, Ur: 0.2 mg/dL (ref 0.2–1.0)
pH, UA: 6.5 (ref 5.0–7.5)

## 2020-05-19 NOTE — Progress Notes (Signed)
05/19/2020 3:35 PM   Courtney Spencer [birth year] [id number]  Referring provider: Berniece Pap, FNP [address] [place],  [place] [address]  Chief Complaint  Patient presents with  . Nephrolithiasis    HPI: Courtney Spencer is a 25 y.o. female who presents for evaluation of stone disease.   presented to the [facility] ED 04/17/2020 complaining of a 2-3-day history of left flank pain radiating to the left lower quadrant  + Nausea without vomiting  No fever or chills  Stone protocol CT was performed which showed 2 stacked calculi in the left distal ureter with an aggregate measurement of 4 mm.  There was also a nonobstructing left renal calculus  Prior history of stone disease 5-7 years ago which she passed  Her pain resolved after 2-3 days and she has been asymptomatic at that time though not aware of passing a stone  At times she does have intermittent, vague lower pelvic discomfort  Denies gross hematuria   PMH: Past Medical History:  Diagnosis Date  . Allergy   . Anxiety   . Cat scratch fever   . Depression   . Endometriosis   . GERD (gastroesophageal reflux disease)   . Heart murmur of newborn   . Helicobacter pylori ab+    H.pylori serology positive in Jan 2015, treated with Amoxicillin and Flagyl at that time. Returned to clinic Feb 2015 and had rechecked, with a different provider treating with Amoxicillin and Biaxin. H.pylori stool antigen ordered April 2015 and negative.  . Hematuria 08/09/2013  . History of kidney stones   . Kidney stones 10/26/2013  . Migraines   . Other and unspecified ovarian cyst 08/09/2013  . Ovarian cyst   . UTI (lower urinary tract infection)     Surgical History: Past Surgical History:  Procedure Laterality Date  . BIOPSY  04/21/2020   Procedure: BIOPSY;  Surgeon: Lanelle Bal, DO;  Location: [facility] ENDO SUITE;  Service: Endoscopy;;  . CHOLECYSTECTOMY N/A 05/12/2015   Procedure:  LAPAROSCOPIC CHOLECYSTECTOMY;  Surgeon: Franky Macho, MD;  Location: [facility] ORS;  Service: General;  Laterality: N/A;  . ESOPHAGOGASTRODUODENOSCOPY N/A 08/13/2013   Dr. Darrick Penna: nodular gastritis, negative H.pylori  . ESOPHAGOGASTRODUODENOSCOPY (EGD) WITH PROPOFOL N/A 04/21/2020   Procedure: ESOPHAGOGASTRODUODENOSCOPY (EGD) WITH PROPOFOL;  Surgeon: Lanelle Bal, DO;  Location: [facility] END[facility];  Service: Endoscopy;  Laterality: N/A;  11:00am  . WISDOM TOOTH EXTRACTION      Home Medications:  Allergies as of 05/19/2020   No Known Allergies     Medication List       Accurate as of May 19, 2020  3:35 PM. If you have any questions, ask your nurse or doctor.        STOP taking these medications   cephALEXin 500 MG capsule Commonly known as: KEFLEX Stopped by: Riki Altes, MD   HYDROcodone-acetaminophen 5-325 MG tablet Commonly known as: NORCO/VICODIN Stopped by: Riki Altes, MD   ondansetron 4 MG disintegrating tablet Commonly known as: ZOFRAN-ODT Stopped by: Riki Altes, MD     TAKE these medications   norethindrone 0.35 MG tablet Commonly known as: MICRONOR Take 1 tablet (0.35 mg total) by mouth daily.   omeprazole 40 MG capsule Commonly known as: PRILOSEC Take 1 capsule (40 mg total) by mouth in the morning and at bedtime.   Vitamin D (Ergocalciferol) 1.25 MG (50000 UNIT) Caps capsule Commonly known as: DRISDOL Take 1 capsule (50,000 Units total) by mouth every  7 (seven) days. (taking one tablet per week) walk in lab in office 1-2 weeks after completing prescription.       Allergies: No Known Allergies  Family History: Family History  Problem Relation Age of Onset  . Gallbladder disease Mother        gallbladder was removed  . Migraines Mother   . Migraines Sister   . Hypertension Sister   . ADD / ADHD Brother   . Hypertension Maternal Grandmother   . Cancer Maternal Grandfather        lung  . Cancer Paternal Grandfather        lung  . Seizures  Sister   . Scoliosis Sister   . ADD / ADHD Brother   . Colon cancer Other        unknown    Social History:  reports that she has never smoked. She has never used smokeless tobacco. She reports that she does not drink alcohol and does not use drugs.   Physical Exam: BP 112/78   Pulse 85   Ht 4\' 11"  (1.499 m)   Wt 190 lb (86.2 kg)   LMP 05/15/2020 (Exact Date)   BMI 38.38 kg/m   Constitutional:  Alert and oriented, No acute distress. HEENT: [place] AT, moist mucus membranes.  Trachea midline, no masses. Cardiovascular: No clubbing, cyanosis, or edema. Respiratory: Normal respiratory effort, no increased work of breathing. GI: Abdomen is soft, nontender, nondistended, no abdominal masses GU: No CVA tenderness Lymph: No cervical or inguinal lymphadenopathy. Skin: No rashes, bruises or suspicious lesions. Neurologic: Grossly intact, no focal deficits, moving all 4 extremities. Psychiatric: Normal mood and affect.  Laboratory Data:  Urinalysis Dipstick trace blood intact Microscopy greater than 10 epis  Pertinent Imaging: CT images personally reviewed and interpreted on [facility]   Assessment & Plan:    1.  Left ureteral calculi  She has most likely passed the small stones  KUB was ordered and if no obvious stone identified will order renal ultrasound to document persistence or resolution of her hydronephrosis  2.  Left nephrolithiasis  Nonobstructing left renal calculus and history recurrent stone disease  I recommended a metabolic evaluation to include 24-hour urine study and blood work through [facility], MD  [facility] [address] [place], [place] [phone]

## 2020-05-19 NOTE — Telephone Encounter (Signed)
Returned pt's call. Pt explained that she had kidney stones in February and was given pain meds, antibiotics, antiemetic, and a med to dilate the ureter (pt couldn't recall name). She had a period from 04/18/20-05/03/20 which consisted of a regular flow as a normal period and then spotting for the remainder of the 15 days. She began a period on 3/20 with spotting, 3/21 with a heavy flow, 3/22 with a lighter flow, and then it ended. Pt was assured that it was likely that all those meds could have altered her cycle, although she reports an irregular cycle even on the [facility] pills. Pt c/o cramping for the past 2 weeks. She is unsure if she ever passed the kidney stones, but was on her way to her follow-up appt to see if that was the cause of her pain. Pt was instructed to let us know if it was not from her kidney stones and wanted to be seen to change her [facility], unless she wanted to wait and see if her cycles would regulate after a few more weeks.

## 2020-05-19 NOTE — Telephone Encounter (Signed)
Pt called and had kidney stones in Feb then she bled for 15 days, now she is having bleeding and spotting and is having cramping and is wanting to know if she needs to be seen in the office. Please advise

## 2020-05-24 ENCOUNTER — Telehealth: Payer: Self-pay

## 2020-05-24 ENCOUNTER — Telehealth: Payer: Self-pay | Admitting: Urology

## 2020-05-24 DIAGNOSIS — N201 Calculus of ureter: Secondary | ICD-10-CM

## 2020-05-24 NOTE — Telephone Encounter (Signed)
Patient called would like to go ahead and schedule surgery

## 2020-05-24 NOTE — Addendum Note (Signed)
Addended by: Levada Schilling on: 05/24/2020 04:21 PM   Modules accepted: Orders

## 2020-05-24 NOTE — Telephone Encounter (Signed)
If she desires a continued trial of passage would recommend a repeat KUB in 1-2 weeks.  Call back if she desires to schedule stone removal

## 2020-05-24 NOTE — Telephone Encounter (Addendum)
Notified patient as instructed, patient pleased. Discussed follow-up appointments, patient agrees  Kub ordered

## 2020-05-24 NOTE — Telephone Encounter (Signed)
KUB was reviewed and it looks like the 2 calculi are still present in the distal ureter and a stone that was in the left kidney at the time of her CT has migrated to the proximal ureter.  Is patient having recurrent flank pain?

## 2020-05-24 NOTE — Telephone Encounter (Signed)
Notified patient as instructed, patient  States she has not had any pain today . She states yesterday and the day before she was having pain .

## 2020-05-26 ENCOUNTER — Telehealth: Payer: Self-pay

## 2020-05-26 ENCOUNTER — Ambulatory Visit: Payer: Self-pay | Admitting: Adult Health

## 2020-05-26 NOTE — Telephone Encounter (Signed)
error 

## 2020-05-26 NOTE — Telephone Encounter (Signed)
Gave scheduling sheet to Leah 

## 2020-05-29 ENCOUNTER — Ambulatory Visit
Admission: RE | Admit: 2020-05-29 | Discharge: 2020-05-29 | Disposition: A | Discharge status: Home / Self Care | Payer: 59 | Source: Ambulatory Visit | Attending: Urology | Admitting: Urology

## 2020-05-29 ENCOUNTER — Other Ambulatory Visit: Payer: 59

## 2020-05-29 ENCOUNTER — Ambulatory Visit
Admission: RE | Admit: 2020-05-29 | Discharge: 2020-05-29 | Disposition: A | Discharge status: Home / Self Care | Payer: 59 | Attending: Urology | Admitting: Urology

## 2020-05-29 DIAGNOSIS — N201 Calculus of ureter: Secondary | ICD-10-CM

## 2020-06-01 ENCOUNTER — Telehealth: Payer: Self-pay | Admitting: Family Medicine

## 2020-06-01 DIAGNOSIS — N200 Calculus of kidney: Secondary | ICD-10-CM

## 2020-06-01 NOTE — Telephone Encounter (Signed)
-----   Message from Riki Altes, MD sent at 05/31/2020  4:16 PM EDT ----- The calculus previously seen in the left upper ureter is now in the lower ureter.  I was able to review her CT from [facility] and the other 2 calcifications that were seen on prior KUB are not stones.  It looks like she has passed her other 2 stones.  There is an excellent chance she will pass the stone.  Would recommend a repeat KUB in 2 weeks and to call should she have worsening pain

## 2020-06-01 NOTE — Telephone Encounter (Signed)
Patient notified and voiced understanding. KUB order is in.

## 2020-06-06 ENCOUNTER — Telehealth: Payer: Self-pay

## 2020-06-06 NOTE — Telephone Encounter (Signed)
Incoming call from pt on triage line, she states that she is very concerned about developing more kidney stones and the cost that she incurs with them. She would like to know what she can do to prevent stones in the future. Advised pt that the number one thing she can do is increase hydration, secondly sent pt information via mychart on dietary restrictions for stone formation, lastly spoke with provider who states she can try LithoLyte daily for stone prevention.

## 2020-06-08 ENCOUNTER — Encounter: Payer: Self-pay | Admitting: Gastroenterology

## 2020-06-09 ENCOUNTER — Encounter: Payer: Self-pay | Admitting: Adult Health

## 2020-06-09 ENCOUNTER — Other Ambulatory Visit: Payer: Self-pay

## 2020-06-09 ENCOUNTER — Ambulatory Visit: Payer: 59 | Admitting: Adult Health

## 2020-06-09 VITALS — BP 94/64 | HR 66 | Temp 98.0°F | Resp 16 | Ht 59.0 in | Wt 190.0 lb

## 2020-06-09 DIAGNOSIS — J301 Allergic rhinitis due to pollen: Secondary | ICD-10-CM | POA: Diagnosis not present

## 2020-06-09 DIAGNOSIS — Z6838 Body mass index (BMI) 38.0-38.9, adult: Secondary | ICD-10-CM

## 2020-06-09 DIAGNOSIS — K219 Gastro-esophageal reflux disease without esophagitis: Secondary | ICD-10-CM

## 2020-06-09 MED ORDER — DESLORATADINE 5 MG PO TABS: 5.0000 mg | ORAL_TABLET | Freq: Every day | ORAL | 1 refills | Status: DC

## 2020-06-09 NOTE — Patient Instructions (Addendum)
Kidney Stones Kidney stones are rock-like masses that form inside of the kidneys. Kidneys are organs that make pee (urine). A kidney stone may move into other parts of the urinary tract, including:  The tubes that connect the kidneys to the bladder (ureters).  The bladder.  The tube that carries urine out of the body (urethra). Kidney stones can cause very bad pain and can block the flow of pee. The stone usually leaves your body (passes) through your pee. You may need to have a doctor take out the stone. What are the causes? Kidney stones may be caused by:  A condition in which certain glands make too much parathyroid hormone (primary hyperparathyroidism).  A buildup of a type of crystals in the bladder made of a chemical called uric acid. The body makes uric acid when you eat certain foods.  Narrowing (stricture) of one or both of the ureters.  A kidney blockage that you were born with.  Past surgery on the kidney or the ureters, such as gastric bypass surgery. What increases the risk? You are more likely to develop this condition if:  You have had a kidney stone in the past.  You have a family history of kidney stones.  You do not drink enough water.  You eat a diet that is high in protein, salt (sodium), or sugar.  You are overweight or very overweight (obese). What are the signs or symptoms? Symptoms of a kidney stone may include:  Pain in the side of the belly, right below the ribs (flank pain). Pain usually spreads (radiates) to the groin.  Needing to pee often or right away (urgently).  Pain when going pee (urinating).  Blood in your pee (hematuria).  Feeling like you may vomit (nauseous).  Vomiting.  Fever and chills. How is this treated? Treatment depends on the size, location, and makeup of the kidney stones. The stones will often pass out of the body through peeing. You may need to:  Drink more fluid to help pass the stone. In some cases, you may be  given fluids through an IV tube put into one of your veins at the hospital.  Take medicine for pain.  Make changes in your diet to help keep kidney stones from coming back. Sometimes, medical procedures are needed to remove a kidney stone. This may involve:  A procedure to break up kidney stones using a beam of light (laser) or shock waves.  Surgery to remove the kidney stones. Follow these instructions at home: Medicines  Take over-the-counter and prescription medicines only as told by your doctor.  Ask your doctor if the medicine prescribed to you requires you to avoid driving or using heavy machinery. Eating and drinking  Drink enough fluid to keep your pee pale yellow. You may be told to drink at least 8-10 glasses of water each day. This will help you pass the stone.  If told by your doctor, change your diet. This may include: ? Limiting how much salt you eat. ? Eating more fruits and vegetables. ? Limiting how much meat, poultry, fish, and eggs you eat.  Follow instructions from your doctor about eating or drinking restrictions. General instructions  Collect pee samples as told by your doctor. You may need to collect a pee sample: ? 24 hours after a stone comes out. ? 8-12 weeks after a stone comes out, and every 6-12 months after that.  Strain your pee every time you pee (urinate), for as long as told. Use the   strainer that your doctor recommends.  Do not throw out the stone. Keep it so that it can be tested by your doctor.  Keep all follow-up visits as told by your doctor. This is important. You may need follow-up tests. How is this prevented? To prevent another kidney stone:  Drink enough fluid to keep your pee pale yellow. This is the best way to prevent kidney stones.  Eat healthy foods.  Avoid certain foods as told by your doctor. You may be told to eat less protein.  Stay at a healthy weight.   Where to find more information  National Kidney Foundation  (NKF): www.kidney.org  [facility]): www.urologyhealth.org Contact a doctor if:  You have pain that gets worse or does not get better with medicine. Get help right away if:  You have a fever or chills.  You get very bad pain.  You get new pain in your belly (abdomen).  You pass out (faint).  You cannot pee. Summary  Kidney stones are rock-like masses that form inside of the kidneys.  Kidney stones can cause very bad pain and can block the flow of pee.  The stones will often pass out of the body through peeing.  Drink enough fluid to keep your pee pale yellow. This information is not intended to replace advice given to you by your health care provider. Make sure you discuss any questions you have with your health care provider. Document Revised: 06/30/2018 Document Reviewed: 06/30/2018 Elsevier Patient Education  2021 Elsevier Inc. Food Choices for Gastroesophageal Reflux Disease, Adult When you have gastroesophageal reflux disease (GERD), the foods you eat and your eating habits are very important. Choosing the right foods can help ease your discomfort. Think about working with a food expert (dietitian) to help you make good choices. What are tips for following this plan? Reading food labels  Look for foods that are low in saturated fat. Foods that may help with your symptoms include: ? Foods that have less than 5% of daily value (DV) of fat. ? Foods that have 0 grams of trans fat. Cooking  Do not fry your food.  Cook your food by baking, steaming, grilling, or broiling. These are all methods that do not need a lot of fat for cooking.  To add flavor, try to use herbs that are low in spice and acidity. Meal planning  Choose healthy foods that are low in fat, such as: ? Fruits and vegetables. ? Whole grains. ? Low-fat dairy products. ? Lean meats, fish, and poultry.  Eat small meals often instead of eating 3 large meals each day. Eat your meals  slowly in a place where you are relaxed. Avoid bending over or lying down until 2-3 hours after eating.  Limit high-fat foods such as fatty meats or fried foods.  Limit your intake of fatty foods, such as oils, butter, and shortening.  Avoid the following as told by your doctor: ? Foods that cause symptoms. These may be different for different people. Keep a food diary to keep track of foods that cause symptoms. ? Alcohol. ? Drinking a lot of liquid with meals. ? Eating meals during the 2-3 hours before bed.   Lifestyle  Stay at a healthy weight. Ask your doctor what weight is healthy for you. If you need to lose weight, work with your doctor to do so safely.  Exercise for at least 30 minutes on 5 or more days each week, or as told by your doctor.  Wear loose-fitting clothes.  Do not smoke or use any products that contain nicotine or tobacco. If you need help quitting, ask your doctor.  Sleep with the head of your bed higher than your feet. Use a wedge under the mattress or blocks under the bed frame to raise the head of the bed.  Chew sugar-free gum after meals. What foods should eat? Eat a healthy, well-balanced diet of fruits, vegetables, whole grains, low-fat dairy products, lean meats, fish, and poultry. Each person is different. Foods that may cause symptoms in one person may not cause any symptoms in another person. Work with your doctor to find foods that are safe for you. The items listed above may not be a complete list of what you can eat and drink. Contact a food expert for more options.   What foods should I avoid? Limiting some of these foods may help in managing the symptoms of GERD. Everyone is different. Talk with a food expert or your doctor to help you find the exact foods to avoid, if any. Fruits Any fruits prepared with added fat. Any fruits that cause symptoms. For some people, this may include citrus fruits, such as oranges, grapefruit, pineapple, and  lemons. Vegetables Deep-fried vegetables. [place] fries. Any vegetables prepared with added fat. Any vegetables that cause symptoms. For some people, this may include tomatoes and tomato products, chili peppers, onions and garlic, and horseradish. Grains Pastries or quick breads with added fat. Meats and other proteins High-fat meats, such as fatty beef or pork, hot dogs, ribs, ham, sausage, salami, and bacon. Fried meat or protein, including fried fish and fried chicken. Nuts and nut butters, in large amounts. Dairy Whole milk and chocolate milk. Sour cream. Cream. Ice cream. Cream cheese. Milkshakes. Fats and oils Butter. Margarine. Shortening. Ghee. Beverages Coffee and tea, with or without caffeine. Carbonated beverages. Sodas. Energy drinks. Fruit juice made with acidic fruits, such as orange or grapefruit. Tomato juice. Alcoholic drinks. Sweets and desserts Chocolate and cocoa. Donuts. Seasonings and condiments Pepper. Peppermint and spearmint. Added salt. Any condiments, herbs, or seasonings that cause symptoms. For some people, this may include curry, hot sauce, or vinegar-based salad dressings. The items listed above may not be a complete list of what you should not eat and drink. Contact a food expert for more options. Questions to ask your doctor Diet and lifestyle changes are often the first steps that are taken to manage symptoms of GERD. If diet and lifestyle changes do not help, talk with your doctor about taking medicines. Where to find more information  International Foundation for Gastrointestinal Disorders: aboutgerd.org Summary  When you have GERD, food and lifestyle choices are very important in easing your symptoms.  Eat small meals often instead of 3 large meals a day. Eat your meals slowly and in a place where you are relaxed.  Avoid bending over or lying down until 2-3 hours after eating.  Limit high-fat foods such as fatty meats or fried foods. This  information is not intended to replace advice given to you by your health care provider. Make sure you discuss any questions you have with your health care provider. Document Revised: 08/23/2019 Document Reviewed: 08/23/2019 Elsevier Patient Education  2021 Elsevier Inc.   Calorie Counting for [employer] Loss Calories are units of energy. Your body needs a certain number of calories from food to keep going throughout the day. When you eat or drink more calories than your body needs, your body stores the extra calories mostly  as fat. When you eat or drink fewer calories than your body needs, your body burns fat to get the energy it needs. Calorie counting means keeping track of how many calories you eat and drink each day. Calorie counting can be helpful if you need to lose weight. If you eat fewer calories than your body needs, you should lose weight. Ask your health care provider what a healthy weight is for you. For calorie counting to work, you will need to eat the right number of calories each day to lose a healthy amount of weight per week. A dietitian can help you figure out how many calories you need in a day and will suggest ways to reach your calorie goal.  A healthy amount of weight to lose each week is usually 1-2 lb (0.5-0.9 kg). This usually means that your daily calorie intake should be reduced by 500-750 calories.  Eating 1,200-1,500 calories a day can help most women lose weight.  Eating 1,500-1,800 calories a day can help most men lose weight. What do I need to know about calorie counting? Work with your health care provider or dietitian to determine how many calories you should get each day. To meet your daily calorie goal, you will need to:  Find out how many calories are in each food that you would like to eat. Try to do this before you eat.  Decide how much of the food you plan to eat.  Keep a food log. Do this by writing down what you ate and how many calories it had. To  successfully lose weight, it is important to balance calorie counting with a healthy lifestyle that includes regular activity. Where do I find calorie information? The number of calories in a food can be found on a Nutrition Facts label. If a food does not have a Nutrition Facts label, try to look up the calories online or ask your dietitian for help. Remember that calories are listed per serving. If you choose to have more than one serving of a food, you will have to multiply the calories per serving by the number of servings you plan to eat. For example, the label on a package of bread might say that a serving size is 1 slice and that there are 90 calories in a serving. If you eat 1 slice, you will have eaten 90 calories. If you eat 2 slices, you will have eaten 180 calories.   How do I keep a food log? After each time that you eat, record the following in your food log as soon as possible:  What you ate. Be sure to include toppings, sauces, and other extras on the food.  How much you ate. This can be measured in cups, ounces, or number of items.  How many calories were in each food and drink.  The total number of calories in the food you ate. Keep your food log near you, such as in a pocket-sized notebook or on an app or website on your mobile phone. Some programs will calculate calories for you and show you how many calories you have left to meet your daily goal. What are some portion-control tips?  Know how many calories are in a serving. This will help you know how many servings you can have of a certain food.  Use a measuring cup to measure serving sizes. You could also try weighing out portions on a kitchen scale. With time, you will be able to estimate serving sizes for  some foods.  Take time to put servings of different foods on your favorite plates or in your favorite bowls and cups so you know what a serving looks like.  Try not to eat straight from a food's packaging, such as from  a bag or box. Eating straight from the package makes it hard to see how much you are eating and can lead to overeating. Put the amount you would like to eat in a cup or on a plate to make sure you are eating the right portion.  Use smaller plates, glasses, and bowls for smaller portions and to prevent overeating.  Try not to multitask. For example, avoid watching TV or using your computer while eating. If it is time to eat, sit down at a table and enjoy your food. This will help you recognize when you are full. It will also help you be more mindful of what and how much you are eating. What are tips for following this plan? Reading food labels  Check the calorie count compared with the serving size. The serving size may be smaller than what you are used to eating.  Check the source of the calories. Try to choose foods that are high in protein, fiber, and vitamins, and low in saturated fat, trans fat, and sodium. Shopping  Read nutrition labels while you shop. This will help you make healthy decisions about which foods to buy.  Pay attention to nutrition labels for low-fat or fat-free foods. These foods sometimes have the same number of calories or more calories than the full-fat versions. They also often have added sugar, starch, or salt to make up for flavor that was removed with the fat.  Make a grocery list of lower-calorie foods and stick to it. Cooking  Try to cook your favorite foods in a healthier way. For example, try baking instead of frying.  Use low-fat dairy products. Meal planning  Use more fruits and vegetables. One-half of your plate should be fruits and vegetables.  Include lean proteins, such as chicken, [place], and fish. Lifestyle Each week, aim to do one of the following:  150 minutes of moderate exercise, such as walking.  75 minutes of vigorous exercise, such as running. General information  Know how many calories are in the foods you eat most often. This will  help you calculate calorie counts faster.  Find a way of tracking calories that works for you. Get creative. Try different apps or programs if writing down calories does not work for you. What foods should I eat?  Eat nutritious foods. It is better to have a nutritious, high-calorie food, such as an avocado, than a food with few nutrients, such as a bag of potato chips.  Use your calories on foods and drinks that will fill you up and will not leave you hungry soon after eating. ? Examples of foods that fill you up are nuts and nut butters, vegetables, lean proteins, and high-fiber foods such as whole grains. High-fiber foods are foods with more than 5 g of fiber per serving.  Pay attention to calories in drinks. Low-calorie drinks include water and unsweetened drinks. The items listed above may not be a complete list of foods and beverages you can eat. Contact a dietitian for more information.   What foods should I limit? Limit foods or drinks that are not good sources of vitamins, minerals, or protein or that are high in unhealthy fats. These include:  Candy.  Other sweets.  Sodas, specialty  coffee drinks, alcohol, and juice. The items listed above may not be a complete list of foods and beverages you should avoid. Contact a dietitian for more information. How do I count calories when eating out?  Pay attention to portions. Often, portions are much larger when eating out. Try these tips to keep portions smaller: ? Consider sharing a meal instead of getting your own. ? If you get your own meal, eat only half of it. Before you start eating, ask for a container and put half of your meal into it. ? When available, consider ordering smaller portions from the menu instead of full portions.  Pay attention to your food and drink choices. Knowing the way food is cooked and what is included with the meal can help you eat fewer calories. ? If calories are listed on the menu, choose the  lower-calorie options. ? Choose dishes that include vegetables, fruits, whole grains, low-fat dairy products, and lean proteins. ? Choose items that are boiled, broiled, grilled, or steamed. Avoid items that are buttered, battered, fried, or served with cream sauce. Items labeled as crispy are usually fried, unless stated otherwise. ? Choose water, low-fat milk, unsweetened iced tea, or other drinks without added sugar. If you want an alcoholic beverage, choose a lower-calorie option, such as a glass of wine or light beer. ? Ask for dressings, sauces, and syrups on the side. These are usually high in calories, so you should limit the amount you eat. ? If you want a salad, choose a garden salad and ask for grilled meats. Avoid extra toppings such as bacon, cheese, or fried items. Ask for the dressing on the side, or ask for olive oil and vinegar or lemon to use as dressing.  Estimate how many servings of a food you are given. Knowing serving sizes will help you be aware of how much food you are eating at restaurants. Where to find more information  Centers for Disease Control and Prevention: FootballExhibition.com.br  [place]. Department of Agriculture: WrestlingReporter.dk Summary  Calorie counting means keeping track of how many calories you eat and drink each day. If you eat fewer calories than your body needs, you should lose weight.  A healthy amount of weight to lose per week is usually 1-2 lb (0.5-0.9 kg). This usually means reducing your daily calorie intake by 500-750 calories.  The number of calories in a food can be found on a Nutrition Facts label. If a food does not have a Nutrition Facts label, try to look up the calories online or ask your dietitian for help.  Use smaller plates, glasses, and bowls for smaller portions and to prevent overeating.  Use your calories on foods and drinks that will fill you up and not leave you hungry shortly after a meal. This information is not intended to replace advice  given to you by your health care provider. Make sure you discuss any questions you have with your health care provider. Document Revised: 03/25/2019 Document Reviewed: 03/25/2019 Elsevier Patient Education  2021 Elsevier Inc.   Fat and Cholesterol Restricted Eating Plan Getting too much fat and cholesterol in your diet may cause health problems. Choosing the right foods helps keep your fat and cholesterol at normal levels. This can keep you from getting certain diseases. Your doctor may recommend an eating plan that includes:  Total fat: ______% or less of total calories a day.  Saturated fat: ______% or less of total calories a day.  Cholesterol: less than _________mg a day.  Fiber: ______g a day. What are tips for following this plan? Meal planning  At meals, divide your plate into four equal parts: ? Fill one-half of your plate with vegetables and green salads. ? Fill one-fourth of your plate with whole grains. ? Fill one-fourth of your plate with low-fat (lean) protein foods.  Eat fish that is high in omega-3 fats at least two times a week. This includes mackerel, tuna, sardines, and salmon.  Eat foods that are high in fiber, such as whole grains, beans, apples, broccoli, carrots, peas, and barley. General tips  Work with your doctor to lose weight if you need to.  Avoid: ? Foods with added sugar. ? Fried foods. ? Foods with partially hydrogenated oils.  Limit alcohol intake to no more than 1 drink a day for nonpregnant women and 2 drinks a day for men. One drink equals 12 oz of beer, 5 oz of wine, or 1 oz of hard liquor.   Reading food labels  Check food labels for: ? Trans fats. ? Partially hydrogenated oils. ? Saturated fat (g) in each serving. ? Cholesterol (mg) in each serving. ? Fiber (g) in each serving.  Choose foods with healthy fats, such as: ? Monounsaturated fats. ? Polyunsaturated fats. ? Omega-3 fats.  Choose grain products that have whole  grains. Look for the word "whole" as the first word in the ingredient list. Cooking  Cook foods using low-fat methods. These include baking, boiling, grilling, and broiling.  Eat more home-cooked foods. Eat at restaurants and buffets less often.  Avoid cooking using saturated fats, such as butter, cream, palm oil, palm kernel oil, and coconut oil. Recommended foods Fruits  All fresh, canned (in natural juice), or frozen fruits. Vegetables  Fresh or frozen vegetables (raw, steamed, roasted, or grilled). Green salads. Grains  Whole grains, such as whole wheat or whole grain breads, crackers, cereals, and pasta. Unsweetened oatmeal, bulgur, barley, quinoa, or brown rice. Corn or whole wheat flour tortillas. Meats and other protein foods  Ground beef (85% or leaner), grass-fed beef, or beef trimmed of fat. Skinless chicken or [place]. Ground chicken or [place]. Pork trimmed of fat. All fish and seafood. Egg whites. Dried beans, peas, or lentils. Unsalted nuts or seeds. Unsalted canned beans. Nut butters without added sugar or oil. Dairy  Low-fat or nonfat dairy products, such as skim or 1% milk, 2% or reduced-fat cheeses, low-fat and fat-free ricotta or cottage cheese, or plain low-fat and nonfat yogurt. Fats and oils  Tub margarine without trans fats. Light or reduced-fat mayonnaise and salad dressings. Avocado. Olive, canola, sesame, or safflower oils. The items listed above may not be a complete list of foods and beverages you can eat. Contact a dietitian for more information.   Foods to avoid Fruits  Canned fruit in heavy syrup. Fruit in cream or butter sauce. Fried fruit. Vegetables  Vegetables cooked in cheese, cream, or butter sauce. Fried vegetables. Grains  White bread. White pasta. White rice. Cornbread. Bagels, pastries, and croissants. Crackers and snack foods that contain trans fat and hydrogenated oils. Meats and other protein foods  Fatty cuts of meat. Ribs, chicken  wings, bacon, sausage, bologna, salami, chitterlings, fatback, hot dogs, bratwurst, and packaged lunch meats. Liver and organ meats. Whole eggs and egg yolks. Chicken and [place] with skin. Fried meat. Dairy  Whole or 2% milk, cream, half-and-half, and cream cheese. Whole milk cheeses. Whole-fat or sweetened yogurt. Full-fat cheeses. Nondairy creamers and whipped toppings. Processed cheese, cheese spreads, and cheese  curds. Beverages  Alcohol. Sugar-sweetened drinks such as sodas, lemonade, and fruit drinks. Fats and oils  Butter, stick margarine, lard, shortening, ghee, or bacon fat. Coconut, palm kernel, and palm oils. Sweets and desserts  Corn syrup, sugars, honey, and molasses. Candy. Jam and jelly. Syrup. Sweetened cereals. Cookies, pies, cakes, donuts, muffins, and ice cream. The items listed above may not be a complete list of foods and beverages you should avoid. Contact a dietitian for more information. Summary  Choosing the right foods helps keep your fat and cholesterol at normal levels. This can keep you from getting certain diseases.  At meals, fill one-half of your plate with vegetables and green salads.  Eat high-fiber foods, like whole grains, beans, apples, carrots, peas, and barley.  Limit added sugar, saturated fats, alcohol, and fried foods. This information is not intended to replace advice given to you by your health care provider. Make sure you discuss any questions you have with your health care provider. Document Revised: 06/16/2019 Document Reviewed: 06/16/2019 Elsevier Patient Education  2021 [employer].

## 2020-06-09 NOTE — Progress Notes (Signed)
Established patient visit   Patient: Courtney Spencer   DOB: 09-09-1995   25 y.o. Female  MRN: [id number] Visit Date: 06/09/2020  Today's healthcare provider: Jairo Ben, FNP   Chief Complaint  Patient presents with  . Gastroesophageal Reflux   Subjective    HPI  Follow up for gastrophagel reflux.   The patient was last seen for this 3 months ago. Changes made at last visit include no changes.Taking omeprazole 40 mg ends next week and will go back to 20 mg has seen gastroenterology.   She reports excellent compliance with treatment. She feels that condition is Improved. She is not having side effects.    She also has kidney stones, no pain now was seen in Emergency room, 06/02/2020.  Doing well has followed up with [facility].  Dr. Virl Diamond. Waiting on stone study analysis. She has cut out tea- and sodas.   Patient  denies any fever, body aches,chills, rash, chest pain, shortness of breath, nausea, vomiting, or diarrhea.  Denies dizziness, lightheadedness, pre syncopal or syncopal episodes.    Wants to try Claritin for nasal allergies with all this pollen.    Went to [facility] for excessive sweating and will follow up with specialist.  Cyril Mourning for OBGYN.   Patient  denies any fever, body aches,chills, rash, chest pain, shortness of breath, nausea, vomiting, or diarrhea.  Denies dizziness, lightheadedness, pre syncopal or syncopal episodes.   Patient Active Problem List   Diagnosis Date Noted  . Allergic rhinitis due to pollen 06/09/2020  . Endometriosis 03/17/2020  . Screening examination for STD (sexually transmitted disease) 12/10/2019  . Encounter for gynecological examination with Papanicolaou smear of cervix 12/10/2019  . Family planning 12/10/2019  . Irregular periods 12/10/2019  . Encounter for initial prescription of contraceptive pills 12/10/2019  . Chronic pelvic pain in female 10/25/2019  . Dysmenorrhea 10/25/2019   . Vaginal pain 08/10/2018  . Dyspareunia in female 08/10/2018  . Excessive sweating, local 11/21/2016  . Migraines with aura 08/19/2016  . Wellness examination 03/18/2016  . Diarrhea 04/18/2015  . Irritable bowel syndrome with diarrhea 02/09/2015  . Dyspareunia 10/24/2014  . Pelvic pain in female 10/24/2014  . Vitamin D deficiency 05/05/2014  . Gynecologic exam normal 04/07/2014  . AP (abdominal pain) 01/10/2014  . Anxiety, generalized 11/23/2013  . Concentration deficit 11/23/2013  . Stress incontinence in female 11/23/2013  . Cysts of both ovaries 10/26/2013  . Gallbladder disease 10/26/2013  . Gastroesophageal reflux disease without esophagitis 10/26/2013  . History of Helicobacter pylori infection 10/26/2013  . Intractable migraine without aura and without status migrainosus 10/26/2013  . Kidney stones 10/26/2013  . Other and unspecified ovarian cyst 08/09/2013  . Hematuria 08/09/2013  . Abdominal pain 08/03/2013   Social History   Tobacco Use  . Smoking status: Never Smoker  . Smokeless tobacco: Never Used  . Tobacco comment: Never really smoked  Vaping Use  . Vaping Use: Never used  Substance Use Topics  . Alcohol use: No    Alcohol/week: 0.0 standard drinks  . Drug use: No   No Known Allergies     Medications: Outpatient Medications Prior to Visit  Medication Sig  . ketorolac (TORADOL) 10 MG tablet Take 10 mg by mouth every 6 (six) hours as needed.  . norethindrone (MICRONOR) 0.35 MG tablet Take 1 tablet (0.35 mg total) by mouth daily.  Marland Kitchen omeprazole (PRILOSEC) 40 MG capsule Take 1 capsule (40 mg total) by mouth in the morning  and at bedtime.  . [EXPIRED] ondansetron (ZOFRAN-ODT) 4 MG disintegrating tablet Take by mouth.  . Vitamin D, Ergocalciferol, (DRISDOL) 1.25 MG (50000 UNIT) CAPS capsule Take 1 capsule (50,000 Units total) by mouth every 7 (seven) days. (taking one tablet per week) walk in lab in office 1-2 weeks after completing prescription.   No  facility-administered medications prior to visit.    Review of Systems  Constitutional: Negative.   Respiratory: Negative.   Cardiovascular: Negative.   Genitourinary: Negative.     Last CBC Lab Results  Component Value Date   WBC 9.7 02/22/2020   HGB 12.4 02/22/2020   HCT 37.0 02/22/2020   MCV 80 02/22/2020   MCH 26.9 02/22/2020   RDW 12.2 02/22/2020   PLT 351 02/22/2020   Last metabolic panel Lab Results  Component Value Date   GLUCOSE 90 02/22/2020   NA 137 02/22/2020   K 4.7 02/22/2020   CL 102 02/22/2020   CO2 20 02/22/2020   BUN 13 02/22/2020   CREATININE 0.77 02/22/2020   GFRNONAA 108 02/22/2020   GFRAA 124 02/22/2020   CALCIUM 9.5 02/22/2020   PROT 6.9 02/22/2020   ALBUMIN 4.5 02/22/2020   LABGLOB 2.4 02/22/2020   AGRATIO 1.9 02/22/2020   BILITOT 0.3 02/22/2020   ALKPHOS 69 02/22/2020   AST 13 02/22/2020   ALT 20 02/22/2020   ANIONGAP 7 05/09/2015   Last lipids Lab Results  Component Value Date   CHOL 114 11/19/2019   HDL 46 11/19/2019   LDLCALC 59 11/19/2019   TRIG 33 11/19/2019   CHOLHDL 2.5 11/19/2019        Objective    BP 94/64 (BP Location: Left Arm, Patient Position: Sitting, Cuff Size: Large)   Pulse 66   Temp 98 F (36.7 C)   Resp 16   Ht 4\' 11"  (1.499 m)   Wt 190 lb (86.2 kg)   LMP 06/02/2020 (Exact Date)   SpO2 100%   BMI 38.38 kg/m  BP Readings from Last 3 Encounters:  06/09/20 94/64  05/19/20 112/78  04/21/20 102/70   Wt Readings from Last 3 Encounters:  06/09/20 190 lb (86.2 kg)  05/19/20 190 lb (86.2 kg)  04/06/20 194 lb 3.2 oz (88.1 kg)      Physical Exam Vitals reviewed.  Constitutional:      General: She is not in acute distress.    Appearance: She is well-developed. She is obese. She is not diaphoretic.     Interventions: She is not intubated. HENT:     Head: Normocephalic and atraumatic.     Right Ear: External ear normal.     Left Ear: External ear normal.     Nose: Nose normal.     Mouth/Throat:      Pharynx: No oropharyngeal exudate.  Eyes:     General: Lids are normal. No scleral icterus.       Right eye: No discharge.        Left eye: No discharge.     Conjunctiva/sclera: Conjunctivae normal.     Right eye: Right conjunctiva is not injected. No exudate or hemorrhage.    Left eye: Left conjunctiva is not injected. No exudate or hemorrhage.    Pupils: Pupils are equal, round, and reactive to light.  Neck:     Thyroid: No thyroid mass or thyromegaly.     Vascular: Normal carotid pulses. No carotid bruit, hepatojugular reflux or JVD.     Trachea: Trachea and phonation normal. No tracheal tenderness or tracheal  deviation.     Meningeal: Brudzinski's sign and Kernig's sign absent.  Cardiovascular:     Rate and Rhythm: Normal rate and regular rhythm.     Pulses: Normal pulses.          Radial pulses are 2+ on the right side and 2+ on the left side.       Dorsalis pedis pulses are 2+ on the right side and 2+ on the left side.       Posterior tibial pulses are 2+ on the right side and 2+ on the left side.     Heart sounds: Normal heart sounds, S1 normal and S2 normal. Heart sounds not distant. No murmur heard. No friction rub. No gallop.   Pulmonary:     Effort: Pulmonary effort is normal. No tachypnea, bradypnea, accessory muscle usage or respiratory distress. She is not intubated.     Breath sounds: Normal breath sounds. No stridor. No wheezing or rales.  Chest:     Chest wall: No tenderness.  Breasts:     Right: No supraclavicular adenopathy.     Left: No supraclavicular adenopathy.    Abdominal:     General: Bowel sounds are normal. There is no distension or abdominal bruit.     Palpations: Abdomen is soft. There is no shifting dullness, fluid wave, hepatomegaly, splenomegaly, mass or pulsatile mass.     Tenderness: There is no abdominal tenderness. There is no guarding or rebound.     Hernia: No hernia is present.  Musculoskeletal:        General: No tenderness or  deformity. Normal range of motion.     Cervical back: Full passive range of motion without pain, normal range of motion and neck supple. No edema, erythema or rigidity. No spinous process tenderness or muscular tenderness. Normal range of motion.  Lymphadenopathy:     Head:     Right side of head: No submental, submandibular, tonsillar, preauricular, posterior auricular or occipital adenopathy.     Left side of head: No submental, submandibular, tonsillar, preauricular, posterior auricular or occipital adenopathy.     Cervical: No cervical adenopathy.     Right cervical: No superficial, deep or posterior cervical adenopathy.    Left cervical: No superficial, deep or posterior cervical adenopathy.     Upper Body:     Right upper body: No supraclavicular or pectoral adenopathy.     Left upper body: No supraclavicular or pectoral adenopathy.  Skin:    General: Skin is warm and dry.     Coloration: Skin is not pale.     Findings: No abrasion, bruising, burn, ecchymosis, erythema, lesion, petechiae or rash.     Nails: There is no clubbing.  Neurological:     Mental Status: She is alert and oriented to person, place, and time.     GCS: GCS eye subscore is 4. GCS verbal subscore is 5. GCS motor subscore is 6.     Cranial Nerves: No cranial nerve deficit.     Sensory: No sensory deficit.     Motor: No tremor, atrophy, abnormal muscle tone or seizure activity.     Coordination: Coordination normal.     Gait: Gait normal.     Deep Tendon Reflexes: Reflexes are normal and symmetric. Reflexes normal. Babinski sign absent on the right side. Babinski sign absent on the left side.     Reflex Scores:      Tricep reflexes are 2+ on the right side and 2+ on the left side.  Bicep reflexes are 2+ on the right side and 2+ on the left side.      Brachioradialis reflexes are 2+ on the right side and 2+ on the left side.      Patellar reflexes are 2+ on the right side and 2+ on the left side.       Achilles reflexes are 2+ on the right side and 2+ on the left side. Psychiatric:        Speech: Speech normal.        Behavior: Behavior normal.        Thought Content: Thought content normal.        Judgment: Judgment normal.    No results found for any visits on 06/09/20.  Assessment & Plan     Gastroesophageal reflux disease, unspecified whether esophagitis present  Allergic rhinitis due to pollen, unspecified seasonality  Body mass index (BMI) of 38.0-38.9 in adult  Meds ordered this encounter  Medications  . desloratadine (CLARINEX) 5 MG tablet    Sig: Take 1 tablet (5 mg total) by mouth daily.    Dispense:  90 tablet    Refill:  1    Return in about 6 months (around 12/09/2020), or if symptoms worsen or fail to improve, for at any time for any worsening symptoms.     Red Flags discussed. The patient was given clear instructions to go to ER or return to medical center if any red flags develop, symptoms do not improve, worsen or new problems develop. They verbalized understanding.  The entirety of the information documented in the History of Present Illness, Review of Systems and Physical Exam were personally obtained by me. Portions of this information were initially documented by the CMA and reviewed by me for thoroughness and accuracy.      Jairo Ben, FNP  [facility] [phone] (phone) [phone] (fax)  [facility]

## 2020-06-12 ENCOUNTER — Other Ambulatory Visit: Payer: Self-pay | Admitting: Urology

## 2020-06-14 ENCOUNTER — Ambulatory Visit
Admission: RE | Admit: 2020-06-14 | Discharge: 2020-06-14 | Disposition: A | Discharge status: Home / Self Care | Payer: 59 | Attending: Urology | Admitting: Urology

## 2020-06-14 ENCOUNTER — Ambulatory Visit: Payer: 59 | Admitting: Nurse Practitioner

## 2020-06-14 ENCOUNTER — Ambulatory Visit
Admission: RE | Admit: 2020-06-14 | Discharge: 2020-06-14 | Disposition: A | Discharge status: Home / Self Care | Payer: 59 | Source: Ambulatory Visit | Attending: Urology | Admitting: Urology

## 2020-06-14 ENCOUNTER — Other Ambulatory Visit: Payer: Self-pay

## 2020-06-14 ENCOUNTER — Encounter: Payer: Self-pay | Admitting: Urology

## 2020-06-14 ENCOUNTER — Ambulatory Visit: Payer: 59 | Admitting: Urology

## 2020-06-14 VITALS — BP 108/79 | HR 75 | Ht 64.0 in | Wt 188.0 lb

## 2020-06-14 DIAGNOSIS — N200 Calculus of kidney: Secondary | ICD-10-CM

## 2020-06-14 DIAGNOSIS — N201 Calculus of ureter: Secondary | ICD-10-CM

## 2020-06-14 MED ORDER — ONDANSETRON 4 MG PO TBDP: 4.0000 mg | ORAL_TABLET | Freq: Three times a day (TID) | ORAL | 0 refills | Status: DC | PRN

## 2020-06-14 NOTE — Progress Notes (Signed)
06/14/2020 10:34 AM   Courtney Spencer [birth year] [id number]  Referring provider: Berniece Pap, FNP [address] [place],  [place] [address]  Chief Complaint  Patient presents with  . Other    HPI: 25 y.o. female presents for follow-up of ureterolithiasis.   Previously seen for distal ureteral calculi which were no longer present on follow-up imaging however a left renal calculus had migrated to the proximal ureter  On follow-up KUB 05/29/2020 the stone was in the upper portion of the distal ureter on KUB and she was minimally symptomatic and desired a trial of passage  She had recurrent renal colic 06/02/2020 and was seen in the ED at [facility]  Stone protocol CT was performed which showed a 3 mm UVJ calculus with mild hydronephrosis  She is not aware of passing a stone however has been asymptomatic for the last 10 days  24-hour urine study performed on 05/29/2020 showed significantly low urine volume at 700 mL output.  That was the most significant abnormality noted  She does state she has increased her fluid intake and is currently drinking 1 gallon of water per day   PMH: Past Medical History:  Diagnosis Date  . Allergy   . Anxiety   . Cat scratch fever   . Depression   . Endometriosis   . GERD (gastroesophageal reflux disease)   . Heart murmur of newborn   . Helicobacter pylori ab+    H.pylori serology positive in Jan 2015, treated with Amoxicillin and Flagyl at that time. Returned to clinic Feb 2015 and had rechecked, with a different provider treating with Amoxicillin and Biaxin. H.pylori stool antigen ordered April 2015 and negative.  . Hematuria 08/09/2013  . History of kidney stones   . Kidney stones 10/26/2013  . Migraines   . Other and unspecified ovarian cyst 08/09/2013  . Ovarian cyst   . UTI (lower urinary tract infection)     Surgical History: Past Surgical History:  Procedure Laterality Date  . BIOPSY  04/21/2020    Procedure: BIOPSY;  Surgeon: Lanelle Bal, DO;  Location: [facility];  Service: Endoscopy;;  . CHOLECYSTECTOMY N/A 05/12/2015   Procedure: LAPAROSCOPIC CHOLECYSTECTOMY;  Surgeon: Franky Macho, MD;  Location: [facility] ORS;  Service: General;  Laterality: N/A;  . ESOPHAGOGASTRODUODENOSCOPY N/A 08/13/2013   Dr. Darrick Penna: nodular gastritis, negative H.pylori  . ESOPHAGOGASTRODUODENOSCOPY (EGD) WITH PROPOFOL N/A 04/21/2020   Procedure: ESOPHAGOGASTRODUODENOSCOPY (EGD) WITH PROPOFOL;  Surgeon: Lanelle Bal, DO;  Location: [facility];  Service: Endoscopy;  Laterality: N/A;  11:00am  . WISDOM TOOTH EXTRACTION      Home Medications:  Allergies as of 06/14/2020   No Known Allergies     Medication List       Accurate as of June 14, 2020 10:34 AM. If you have any questions, ask your nurse or doctor.        desloratadine 5 MG tablet Commonly known as: CLARINEX Take 1 tablet (5 mg total) by mouth daily.   ketorolac 10 MG tablet Commonly known as: TORADOL Take 10 mg by mouth every 6 (six) hours as needed.   norethindrone 0.35 MG tablet Commonly known as: MICRONOR Take 1 tablet (0.35 mg total) by mouth daily.   omeprazole 40 MG capsule Commonly known as: PRILOSEC Take 1 capsule (40 mg total) by mouth in the morning and at bedtime.   ondansetron 4 MG disintegrating tablet Commonly known as: Zofran ODT Take 1 tablet (4 mg total) by  mouth every 8 (eight) hours as needed for nausea or vomiting. Started by: Riki Altes, MD   Vitamin D (Ergocalciferol) 1.25 MG (50000 UNIT) Caps capsule Commonly known as: DRISDOL Take 1 capsule (50,000 Units total) by mouth every 7 (seven) days. (taking one tablet per week) walk in lab in office 1-2 weeks after completing prescription.       Allergies: No Known Allergies  Family History: Family History  Problem Relation Age of Onset  . Gallbladder disease Mother        gallbladder was removed  . Migraines Mother   . Migraines Sister    . Hypertension Sister   . ADD / ADHD Brother   . Hypertension Maternal Grandmother   . Cancer Maternal Grandfather        lung  . Cancer Paternal Grandfather        lung  . Seizures Sister   . Scoliosis Sister   . ADD / ADHD Brother   . Colon cancer Other        unknown    Social History:  reports that she has never smoked. She has never used smokeless tobacco. She reports that she does not drink alcohol and does not use drugs.   Physical Exam: BP 108/79   Pulse 75   Ht 5\' 4"  (1.626 m)   Wt 188 lb (85.3 kg)   LMP 06/02/2020 (Exact Date)   BMI 32.27 kg/m   Constitutional:  Alert and oriented, No acute distress. HEENT: [place] AT, moist mucus membranes.  Trachea midline, no masses. Cardiovascular: No clubbing, cyanosis, or edema. Respiratory: Normal respiratory effort, no increased work of breathing. Skin: No rashes, bruises or suspicious lesions. Neurologic: Grossly intact, no focal deficits, moving all 4 extremities. Psychiatric: Normal mood and affect.   Pertinent Imaging:  KUB performed today was reviewed and personally interpreted.  Stable pelvic phleboliths.  The previously identified left ureteral calculus is not seen.  Assessment & Plan:    1.  Recurrent urolithiasis  Asymptomatic and calculus not seen on today's KUB  Since she is not definitely aware of passing a calculus will obtain a follow-up renal ultrasound to document resolution of hydronephrosis and will call with results  Significantly low urine volume on 24-hour urine study however she has increased her water intake and is drinking 1 gallon per day  6 month follow-up with KUB   08/02/2020, MD  [facility] [address], [address] [place], [place] [phone]

## 2020-06-15 ENCOUNTER — Encounter: Payer: Self-pay | Admitting: Family Medicine

## 2020-06-15 ENCOUNTER — Telehealth (INDEPENDENT_AMBULATORY_CARE_PROVIDER_SITE_OTHER): Payer: 59 | Admitting: Family Medicine

## 2020-06-15 DIAGNOSIS — J301 Allergic rhinitis due to pollen: Secondary | ICD-10-CM | POA: Diagnosis not present

## 2020-06-15 DIAGNOSIS — R059 Cough, unspecified: Secondary | ICD-10-CM | POA: Diagnosis not present

## 2020-06-15 DIAGNOSIS — H6690 Otitis media, unspecified, unspecified ear: Secondary | ICD-10-CM | POA: Diagnosis not present

## 2020-06-15 DIAGNOSIS — J069 Acute upper respiratory infection, unspecified: Secondary | ICD-10-CM

## 2020-06-15 MED ORDER — AZITHROMYCIN 250 MG PO TABS: ORAL_TABLET | ORAL | 0 refills | Status: AC

## 2020-06-15 NOTE — Progress Notes (Signed)
MyChart Video Visit    Virtual Visit via Video Note   This visit type was conducted due to national recommendations for restrictions regarding the COVID-19 Pandemic (e.g. social distancing) in an effort to limit this patient's exposure and mitigate transmission in our community. This patient is at least at moderate risk for complications without adequate follow up. This format is felt to be most appropriate for this patient at this time. Physical exam was limited by quality of the video and audio technology used for the visit.   Patient location: Home Provider location: Office  I discussed the limitations of evaluation and management by telemedicine and the availability of in person appointments. The patient expressed understanding and agreed to proceed.  Patient: Courtney Spencer   DOB: [birth year]   24 y.o. Female  MRN: [id number] Visit Date: 06/15/2020  Today's healthcare provider: Megan Mans, MD   Chief Complaint  Patient presents with  . Ear Pain  . Nasal Congestion   Subjective    HPI  Patient states for about 3 to 4 days she has had sinus congestion and rhinorrhea.  She then developed a nonproductive cough and now has developed ear pain.  She had a negative at-home COVID test 2 days ago.  She has had no fever myalgias or shortness of breath.  She is taking nothing for symptoms other than Claritin.  The ear pain is why she is calling today.     Medications: Outpatient Medications Prior to Visit  Medication Sig  . desloratadine (CLARINEX) 5 MG tablet Take 1 tablet (5 mg total) by mouth daily.  Marland Kitchen ketorolac (TORADOL) 10 MG tablet Take 10 mg by mouth every 6 (six) hours as needed.  . norethindrone (MICRONOR) 0.35 MG tablet Take 1 tablet (0.35 mg total) by mouth daily.  Marland Kitchen omeprazole (PRILOSEC) 40 MG capsule Take 1 capsule (40 mg total) by mouth in the morning and at bedtime.  . ondansetron (ZOFRAN ODT) 4 MG disintegrating tablet Take 1 tablet (4 mg total) by  mouth every 8 (eight) hours as needed for nausea or vomiting.  . Vitamin D, Ergocalciferol, (DRISDOL) 1.25 MG (50000 UNIT) CAPS capsule Take 1 capsule (50,000 Units total) by mouth every 7 (seven) days. (taking one tablet per week) walk in lab in office 1-2 weeks after completing prescription. (Patient not taking: Reported on 06/15/2020)   No facility-administered medications prior to visit.    Review of Systems     Objective    LMP 06/02/2020 (Exact Date)     Physical Exam  She is cooperative and alert and oriented during the visit.  She is in no acute distress.  She is breathing well.   Assessment & Plan     1. Subacute otitis media, unspecified otitis media type Treat with Z-Pak. . 2. Seasonal allergic rhinitis due to pollen Patient taking Claritin.  Consider adding Flonase  3. Viral upper respiratory tract infection Patient has had a negative at home COVID test.  4. Cough Try Robitussin twice a day.   No follow-ups on file.     I discussed the assessment and treatment plan with the patient. The patient was provided an opportunity to ask questions and all were answered. The patient agreed with the plan and demonstrated an understanding of the instructions.   The patient was advised to call back or seek an in-person evaluation if the symptoms worsen or if the condition fails to improve as anticipated.  I provided 11 minutes of non-face-to-face time  during this encounter.  I, Megan Mans, MD, have reviewed all documentation for this visit. The documentation on 06/15/20 for the exam, diagnosis, procedures, and orders are all accurate and complete.   Richard Wendelyn Breslow, MD [facility] [phone] (phone) [phone] (fax)  [facility]

## 2020-06-18 ENCOUNTER — Encounter: Payer: Self-pay | Admitting: Urology

## 2020-07-13 ENCOUNTER — Other Ambulatory Visit: Payer: Self-pay | Admitting: Nurse Practitioner

## 2020-07-13 DIAGNOSIS — K219 Gastro-esophageal reflux disease without esophagitis: Secondary | ICD-10-CM

## 2020-07-13 DIAGNOSIS — R1013 Epigastric pain: Secondary | ICD-10-CM

## 2020-07-20 ENCOUNTER — Ambulatory Visit: Payer: 59 | Admitting: Nurse Practitioner

## 2020-07-29 NOTE — Progress Notes (Deleted)
**Note Courtney-Identified via Obfuscation** Referring Provider: Stephanie Acre* Primary Care Physician:  Berniece Pap, FNP Primary GI Physician: Dr. Marletta Lor  No chief complaint on file.   HPI:   Courtney Spencer is a 25 y.o. female presenting today for follow-up of GERD and epigastric pain s/p EGD.  Last seen in our office 04/06/2020 at the request of primary care for epigastric pain and GERD.  She had completed CBC and CMP in December 2021 both of which were normal.  PCP had started her on Prilosec 40 mg daily but had not noted any significant improvement.  Symptoms are worse early in the morning and later at night.  Associated burning abdominal pain, mostly in the upper abdomen, esophageal burning, intermittent nausea without vomiting.  Chronic, mostly loose bowel movements.  No BRBPR or melena.  Also noted patient had recently been diagnosed with endometriosis which was trying to be managed by birth control.  Omeprazole was increased to twice daily and she was scheduled for EGD.  EGD 04/21/2020: Small hiatal hernia, gastritis biopsied, esophageal biopsies taken, normal examined duodenum s/p biopsy.  Esophageal biopsy benign, gastric biopsy with mild chronic gastritis, negative H. pylori, duodenal biopsy benign.  Recommended PPI twice daily x8 weeks then once daily.  Today:  GERD/Epigastric Pain:   Loose Stools:     Used to take Levsin for diarrhea with improvement in 2017.  ?Endometriosis.   Past Medical History:  Diagnosis Date  . Allergy   . Anxiety   . Cat scratch fever   . Depression   . Endometriosis   . GERD (gastroesophageal reflux disease)   . Heart murmur of newborn   . Helicobacter pylori ab+    H.pylori serology positive in Jan 2015, treated with Amoxicillin and Flagyl at that time. Returned to clinic Feb 2015 and had rechecked, with a different provider treating with Amoxicillin and Biaxin. H.pylori stool antigen ordered April 2015 and negative.  . Hematuria 08/09/2013  . History  of kidney stones   . Kidney stones 10/26/2013  . Migraines   . Other and unspecified ovarian cyst 08/09/2013  . Ovarian cyst   . UTI (lower urinary tract infection)     Past Surgical History:  Procedure Laterality Date  . BIOPSY  04/21/2020   Procedure: BIOPSY;  Surgeon: Lanelle Bal, DO;  [facility];  Service: Endoscopy;;  . CHOLECYSTECTOMY N/A 05/12/2015   Procedure: LAPAROSCOPIC CHOLECYSTECTOMY;  Surgeon: Franky Macho, MD;  Location: [facility];  Service: General;  Laterality: N/A;  . ESOPHAGOGASTRODUODENOSCOPY N/A 08/13/2013   Dr. Darrick Penna: nodular gastritis, negative H.pylori  . ESOPHAGOGASTRODUODENOSCOPY (EGD) WITH PROPOFOL N/A 04/21/2020   Procedure: ESOPHAGOGASTRODUODENOSCOPY (EGD) WITH PROPOFOL;  Surgeon: Lanelle Bal, DO;  Location: [facility];  Service: Endoscopy;  Laterality: N/A;  11:00am  . WISDOM TOOTH EXTRACTION      Current Outpatient Medications  Medication Sig Dispense Refill  . desloratadine (CLARINEX) 5 MG tablet Take 1 tablet (5 mg total) by mouth daily. 90 tablet 1  . ketorolac (TORADOL) 10 MG tablet Take 10 mg by mouth every 6 (six) hours as needed.    . norethindrone (MICRONOR) 0.35 MG tablet Take 1 tablet (0.35 mg total) by mouth daily. 28 tablet 11  . omeprazole (PRILOSEC) 40 MG capsule TAKE 1 CAPSULE (40 MG TOTAL) BY MOUTH IN THE MORNING AND AT BEDTIME. 180 capsule 1  . ondansetron (ZOFRAN ODT) 4 MG disintegrating tablet Take 1 tablet (4 mg total) by mouth every 8 (eight) hours as  needed for nausea or vomiting. 30 tablet 0  . Vitamin D, Ergocalciferol, (DRISDOL) 1.25 MG (50000 UNIT) CAPS capsule Take 1 capsule (50,000 Units total) by mouth every 7 (seven) days. (taking one tablet per week) walk in lab in office 1-2 weeks after completing prescription. (Patient not taking: Reported on 06/15/2020) 12 capsule 0   No current facility-administered medications for this visit.    Allergies as of 07/31/2020  . (No Known Allergies)    Family History   Problem Relation Age of Onset  . Gallbladder disease Mother        gallbladder was removed  . Migraines Mother   . Migraines Sister   . Hypertension Sister   . ADD / ADHD Brother   . Hypertension Maternal Grandmother   . Cancer Maternal Grandfather        lung  . Cancer Paternal Grandfather        lung  . Seizures Sister   . Scoliosis Sister   . ADD / ADHD Brother   . Colon cancer Other        unknown    Social History   Socioeconomic History  . Marital status: Married    Spouse name: Not on file  . Number of children: Not on file  . Years of education: Not on file  . Highest education level: Not on file  Occupational History  . 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 Smoking status: Never Smoker  . Smokeless tobacco: Never Used  . Tobacco comment: Never really smoked  Vaping Use  . Vaping Use: Never used  Substance and Sexual Activity  . Alcohol use: No    Alcohol/week: 0.0 standard drinks  . Drug use: No  . Sexual activity: Yes    Birth control/protection: Pill  Other Topics Concern  . Not on file  Social History Narrative  . Not on file   Social Determinants of Health   Financial Resource Strain: Low Risk   . Difficulty of Paying Living Expenses: Not hard at all  Food Insecurity: No Food Insecurity  . Worried About Programme researcher, broadcasting/film/video in the Last Year: Never true  . Ran Out of Food in the Last Year: Never true  Transportation Needs: No Transportation Needs  . Lack of Transportation (Medical): No  . Lack of Transportation (Non-Medical): No  Physical Activity: Insufficiently Active  . Days of Exercise per Week: 2 days  . Minutes of Exercise per Session: 30 min  Stress: Stress Concern Present  . Feeling of Stress : Very much  Social Connections: Moderately Isolated  . Frequency of Communication with Friends and Family: More than three times a week  .  Frequency of Social Gatherings with Friends and Family: More than three times a week  . Attends Religious Services: Never  . Active Member of Clubs or Organizations: No  . Attends Banker Meetings: Never  . Marital Status: Married    Review of Systems: Gen: Denies fever, chills, anorexia. Denies fatigue, weakness, weight loss.  CV: Denies chest pain, palpitations, syncope, peripheral edema, and claudication. Resp: Denies dyspnea at rest, cough, wheezing, coughing up blood, and pleurisy. GI: Denies vomiting blood, jaundice, and fecal incontinence.   Denies dysphagia or odynophagia. Derm: Denies rash, itching, dry skin Psych: Denies depression, anxiety, memory loss, confusion. No homicidal or suicidal ideation.  Heme: Denies bruising, bleeding, and enlarged lymph  nodes.  Physical Exam: There were no vitals taken for this visit. General:   Alert and oriented. No distress noted. Pleasant and cooperative.  Head:  Normocephalic and atraumatic. Eyes:  Conjuctiva clear without scleral icterus. Mouth:  Oral mucosa pink and moist. Good dentition. No lesions. Heart:  S1, S2 present without murmurs appreciated. Lungs:  Clear to auscultation bilaterally. No wheezes, rales, or rhonchi. No distress.  Abdomen:  +BS, soft, non-tender and non-distended. No rebound or guarding. No HSM or masses noted. Msk:  Symmetrical without gross deformities. Normal posture. Extremities:  Without edema. Neurologic:  Alert and  oriented x4 Psych:  Alert and cooperative. Normal mood and affect.

## 2020-07-31 ENCOUNTER — Ambulatory Visit: Payer: 59 | Admitting: Gastroenterology

## 2020-08-25 ENCOUNTER — Ambulatory Visit: Payer: 59 | Admitting: Obstetrics and Gynecology

## 2020-09-13 ENCOUNTER — Ambulatory Visit: Payer: 59 | Admitting: Women's Health

## 2020-10-09 ENCOUNTER — Encounter: Payer: Self-pay | Admitting: Adult Health

## 2020-10-09 ENCOUNTER — Other Ambulatory Visit: Payer: Self-pay

## 2020-10-09 ENCOUNTER — Ambulatory Visit (INDEPENDENT_AMBULATORY_CARE_PROVIDER_SITE_OTHER): Payer: 59 | Admitting: Adult Health

## 2020-10-09 VITALS — BP 103/79 | HR 77 | Ht 59.0 in | Wt 195.5 lb

## 2020-10-09 DIAGNOSIS — R102 Pelvic and perineal pain: Secondary | ICD-10-CM | POA: Diagnosis not present

## 2020-10-09 DIAGNOSIS — N926 Irregular menstruation, unspecified: Secondary | ICD-10-CM | POA: Diagnosis not present

## 2020-10-09 DIAGNOSIS — G8929 Other chronic pain: Secondary | ICD-10-CM

## 2020-10-09 MED ORDER — PRENATAL PLUS 27-1 MG PO TABS: 1.0000 | ORAL_TABLET | Freq: Every day | ORAL | 12 refills | Status: DC

## 2020-10-09 MED ORDER — NORETHINDRONE 0.35 MG PO TABS: 1.0000 | ORAL_TABLET | Freq: Every day | ORAL | 11 refills | Status: DC

## 2020-10-09 NOTE — Progress Notes (Signed)
  Subjective:     Patient ID: Courtney Spencer, female   DOB: 1995-06-16, 25 y.o.   MRN: [id number]  HPI [name] is a 25 year old white female,married, G0P0, in complaining of irregular periods and pelvic pain for 1 week. She is on Micronor and has been good with the pain until recently and has skipped periods and had 2 in a month. She thinking about getting pregnant first of next year. PCP is Marvell Fuller NP.  Lab Results  Component Value Date   DIAGPAP  12/10/2019    - Negative for intraepithelial lesion or malignancy (NILM)   HPVHIGH Negative 12/10/2019    Review of Systems Pelvic pain x week, none now Irregular periods Reviewed past medical,surgical, social and family history. Reviewed medications and allergies.     Objective:   Physical Exam BP 103/79 (BP Location: Left Arm, Patient Position: Sitting, Cuff Size: Normal)   Pulse 77   Ht 4\' 11"  (1.499 m)   Wt 195 lb 8 oz (88.7 kg)   LMP 10/07/2020   BMI 39.49 kg/m     Skin warm and dry.Pelvic: external genitalia is normal in appearance no lesions, vagina: white discharge with odor,urethra has no lesions or masses noted, cervix:smooth and bulbous, uterus: normal size, shape and contour, non tender, no masses felt, adnexa: no masses or tenderness noted. Bladder is non tender and no masses felt. Fall risk is low  Upstream - 10/09/20 1117       Pregnancy Intention Screening   Does the patient want to become pregnant in the next year? Yes    Does the patient's partner want to become pregnant in the next year? Yes    Would the patient like to discuss contraceptive options today? No      Contraception Wrap Up   Current Method Oral Contraceptive    End Method Oral Contraceptive    Contraception Counseling Provided No            Examination chaperoned by 10/11/20 LPN.  Assessment:     1. Irregular periods Continue Micronor for now Call me when stops, taking them and has second period, will check  progesterone level. Will go ahead and rx PNV Meds ordered this encounter  Medications   norethindrone (MICRONOR) 0.35 MG tablet    Sig: Take 1 tablet (0.35 mg total) by mouth daily.    Dispense:  28 tablet    Refill:  11    Order Specific Question:   Supervising Provider    Answer:   Malachy Mood H [[id number]]   prenatal vitamin w/FE, FA (PRENATAL 1 + 1) 27-1 MG TABS tablet    Sig: Take 1 tablet by mouth daily at 12 noon.    Dispense:  30 tablet    Refill:  12    Order Specific Question:   Supervising Provider    Answer:   Duane Lope H [[id number]]     2. Chronic pelvic pain in female Better on OCs, ?endometriosis     Plan:     Physical in 2 months

## 2020-10-23 ENCOUNTER — Encounter: Payer: Self-pay | Admitting: Adult Health

## 2020-12-04 ENCOUNTER — Encounter: Payer: 59 | Admitting: Adult Health

## 2020-12-11 ENCOUNTER — Other Ambulatory Visit: Payer: Self-pay

## 2020-12-11 ENCOUNTER — Encounter: Payer: Self-pay | Admitting: Adult Health

## 2020-12-11 ENCOUNTER — Ambulatory Visit (INDEPENDENT_AMBULATORY_CARE_PROVIDER_SITE_OTHER): Payer: 59 | Admitting: Adult Health

## 2020-12-11 VITALS — BP 111/76 | HR 75 | Ht 59.0 in | Wt 193.8 lb

## 2020-12-11 DIAGNOSIS — Z319 Encounter for procreative management, unspecified: Secondary | ICD-10-CM | POA: Insufficient documentation

## 2020-12-11 DIAGNOSIS — Z01419 Encounter for gynecological examination (general) (routine) without abnormal findings: Secondary | ICD-10-CM | POA: Diagnosis not present

## 2020-12-11 NOTE — Progress Notes (Signed)
History of Present Illness: Courtney Spencer is a 25 year old white female,married, G0P0, in for a well woman gyn exam. She is thinking of stopping micronor at the end of this pack and wants to get pregnant. PCP is Marvell Fuller NP.   Lab Results  Component Value Date   DIAGPAP  12/10/2019    - Negative for intraepithelial lesion or malignancy (NILM)   HPVHIGH Negative 12/10/2019    Current Medications, Allergies, Past Medical History, Past Surgical History, Family History and Social History were reviewed in [employer] record.     Review of Systems: Patient denies any headaches, hearing loss, fatigue, blurred vision, shortness of breath, chest pain, abdominal pain, problems with bowel movements, urination, or intercourse. No joint pain or mood swings.     Physical Exam:BP 111/76 (BP Location: Right Arm, Patient Position: Sitting, Cuff Size: Normal)   Pulse 75   Ht 4\' 11"  (1.499 m)   Wt 193 lb 12.8 oz (87.9 kg)   LMP 11/29/2020   BMI 39.14 kg/m   General:  Well developed, well nourished, no acute distress Skin:  Warm and dry Neck:  Midline trachea, normal thyroid, good ROM, no lymphadenopathy Lungs; Clear to auscultation bilaterally Breast:  No dominant palpable mass, retraction, or nipple discharge Cardiovascular: Regular rate and rhythm Abdomen:  Soft, non tender, no hepatosplenomegaly Pelvic:  External genitalia is normal in appearance, no lesions.  The vagina is normal in appearance. Urethra has no lesions or masses. The cervix is smooth and nulliparous.  Uterus is felt to be normal size, shape, and contour.  No adnexal masses or tenderness noted.Bladder is non tender, no masses felt. Rectal:Deferred  Extremities/musculoskeletal:  No swelling or varicosities noted, no clubbing or cyanosis Psych:  No mood changes, alert and cooperative,seems happy AA is 1 Fall risk is low Depression screen [facility] 2/9 12/11/2020 12/10/2019 11/19/2019  Decreased Interest 0 1  2  Down, Depressed, Hopeless 0 1 1  PHQ - 2 Score 0 2 3  Altered sleeping - 2 3  Tired, decreased energy - 2 3  Change in appetite - 0 0  Feeling bad or failure about yourself  - 0 1  Trouble concentrating - 0 0  Moving slowly or fidgety/restless - 0 0  Suicidal thoughts - 0 0  PHQ-9 Score - 6 10  Difficult doing work/chores - - Not difficult at all    GAD 7 : Generalized Anxiety Score 12/10/2019  Nervous, Anxious, on Edge 1  Control/stop worrying 1  Worry too much - different things 0  Trouble relaxing 0  Restless 0  Easily annoyed or irritable 0  Afraid - awful might happen 2  Total GAD 7 Score 4      Upstream - 12/11/20 0842       Pregnancy Intention Screening   Does the patient want to become pregnant in the next year? Yes    Does the patient's partner want to become pregnant in the next year? Yes    Would the patient like to discuss contraceptive options today? No      Contraception Wrap Up   Current Method Oral Contraceptive    End Method Pregnant/Seeking Pregnancy    Contraception Counseling Provided No            Examination chaperoned by 12/13/20 LPN   Impression and Plan: 1. Encounter for well woman exam with routine gynecological exam Physical in 1 year Pap in 2024   2. Patient desires pregnancy Take PNV Discussing  timing of sex and have fun, can take 6-18 months of active trying or happen first time with unprotected sex

## 2020-12-14 ENCOUNTER — Ambulatory Visit: Payer: Self-pay | Admitting: Urology

## 2020-12-25 NOTE — Progress Notes (Signed)
Referring Provider: Sharmon Leyden* Primary Care Physician:  Doreen Beam, FNP Primary GI Physician: Dr. Abbey Chatters  Chief Complaint  Patient presents with   hearburn    "A lot"   Abdominal Pain    Mid lower abd    HPI:   Courtney Spencer is a 25 y.o. female presenting today for follow-up of GERD and epigastric pain s/p EGD.  Last seen in our office 04/06/2020.  Reported abdominal pain, mostly epigastric and described as burning worsening x 3-4 months.  Also with bitter taste and esophageal burning, intermittent nausea without vomiting.  Has been taking omeprazole 40 mg daily without any significant improvement.  Chronic loose stools with history of cholecystectomy.  No alarm symptoms.  Also reported recently being diagnosed with endometriosis which was being managed with birth control.  Plan to increase omeprazole to 40 mg twice daily and arrange EGD.  EGD 04/21/2020: Small hiatal hernia, gastritis biopsied, normal examined duodenum biopsied, normal esophagus biopsied.  Pathology revealed benign duodenal biopsy, mild chronic gastritis negative for H. pylori, benign esophageal biopsy.  Recommended PPI twice daily x8 weeks then decrease to once daily.  Today:  GERD: Everything gives her heart burn. Did well on PPI BID. No dysphagia. Occasional nausea with reflux, no vomiting. Not as much epigastric pain. Still tender to palpation.   Lower abdominal pain:  Chronic abdominal pain. Was told it was her gallbladder previously, but symptoms continued after surgery.  Pain is cramping, daily, and comes and goes.  Interestingly, she reports symptoms are worse when she has heartburn and were somewhat improved with PPI twice daily.  Symptoms are most severe around the time of her period.  She does get some relief with bowel movements.  Chronic loose/mushy stools.  Typically with 3-6 BMs per day and often postprandial.  The frequency depends on what she eats, worsens with  fried/fatty foods or dairy products.  Loose stools predate cholecystectomy.  After abdominal pain will reduce to 2-04/2008 in severity.  Otherwise, abdominal pain is about 5-6 out of 10 in severity.  Denies urinary symptoms.  Denies BRBPR or melena.  Reports history of endometriosis. Was initially prescribed Orilissa for endometriosis, but didn't want to take it, so she was started on birth control, but this didn't help the lower abdominal pain. No longer taking birth control. Trying to get pregnant.   No unintentional weight loss.   Tries not to take NSAIDs. Once a month or less.   No FHx of IBD.   Past Medical History:  Diagnosis Date   Allergy    Anxiety    Cat scratch fever    Depression    Endometriosis    GERD (gastroesophageal reflux disease)    Heart murmur of newborn    Helicobacter pylori ab+    H.pylori serology positive in Jan 2015, treated with Amoxicillin and Flagyl at that time. Returned to clinic Feb 2015 and had rechecked, with a different provider treating with Amoxicillin and Biaxin. H.pylori stool antigen ordered April 2015 and negative.   Hematuria 08/09/2013   History of kidney stones    Kidney stones 10/26/2013   Migraines    Other and unspecified ovarian cyst 08/09/2013   Ovarian cyst    UTI (lower urinary tract infection)     Past Surgical History:  Procedure Laterality Date   BIOPSY  04/21/2020   Procedure: BIOPSY;  Surgeon: Eloise Harman, DO;  [facility];  Service: Endoscopy;;   CHOLECYSTECTOMY N/A  05/12/2015   Procedure: LAPAROSCOPIC CHOLECYSTECTOMY;  Surgeon: Aviva Signs, MD;  Location: [facility];  Service: General;  Laterality: N/A;   ESOPHAGOGASTRODUODENOSCOPY N/A 08/13/2013   Dr. Oneida Alar: nodular gastritis, negative H.pylori   ESOPHAGOGASTRODUODENOSCOPY (EGD) WITH PROPOFOL N/A 04/21/2020   Surgeon: Eloise Harman, DO; Small hiatal hernia, gastritis biopsied, normal examined duodenum biopsied, normal esophagus biopsied.  Pathology  revealed benign duodenal biopsy, mild chronic gastritis negative for H. pylori, benign esophageal biopsy.   WISDOM TOOTH EXTRACTION      Current Outpatient Medications  Medication Sig Dispense Refill   desloratadine (CLARINEX) 5 MG tablet Take 1 tablet (5 mg total) by mouth daily. (Patient taking differently: Take 5 mg by mouth as needed.) 90 tablet 1   dicyclomine (BENTYL) 10 MG capsule Take 1 capsule (10 mg total) by mouth 4 (four) times daily -  before meals and at bedtime. 90 capsule 2   Prenatal Vit-Fe Fumarate-FA (PRENATAL PO) Take by mouth daily.     omeprazole (PRILOSEC) 40 MG capsule Take 1 capsule (40 mg total) by mouth in the morning and at bedtime. 180 capsule 1   No current facility-administered medications for this visit.    Allergies as of 12/27/2020   (No Known Allergies)    Family History  Problem Relation Age of Onset   Gallbladder disease Mother        gallbladder was removed   Migraines Mother    Migraines Sister    Hypertension Sister    Seizures Sister    Scoliosis Sister    ADD / ADHD Brother    ADD / ADHD Brother    Hypertension Maternal Grandmother    Cancer Maternal Grandfather        lung   Cancer Paternal Grandfather        lung   Colon cancer Other        unknown   Inflammatory bowel disease Neg Hx     Social History   Socioeconomic History   Marital status: Married    Spouse name: Not on file   Number of children: Not on file   Years of education: Not on file   Highest education level: Not on file  Occupational History   Occupation: student    Comment: Early Futures trader, 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   Tobacco Use   Smoking status: Never   Smokeless tobacco: Never   Tobacco comments:    Never really smoked  Vaping Use   Vaping Use: Never used  Substance and Sexual Activity   Alcohol use: No    Alcohol/week: 0.0 standard drinks   Drug use: No   Sexual activity: Yes    Partners: Male     Birth control/protection: Pill  Other Topics Concern   Not on file  Social History Narrative   Not on file   Social Determinants of Health   Financial Resource Strain: Low Risk    Difficulty of Paying Living Expenses: Not hard at all  Food Insecurity: No Food Insecurity   Worried About Charity fundraiser in the Last Year: Never true   Ran Out of Food in the Last Year: Never true  Transportation Needs: No Transportation Needs   Lack of Transportation (Medical): No   Lack of Transportation (Non-Medical): No  Physical Activity: Insufficiently Active   Days of Exercise per Week: 2 days   Minutes of Exercise per Session: 30 min  Stress: No Stress Concern Present  Feeling of Stress : Not at all  Social Connections: Moderately Isolated   Frequency of Communication with Friends and Family: More than three times a week   Frequency of Social Gatherings with Friends and Family: More than three times a week   Attends Religious Services: Never   Marine scientist or Organizations: No   Attends Music therapist: Never   Marital Status: Married    Review of Systems: Gen: Denies fever, chills, cold or flulike symptoms, presyncope, syncope CV: Denies chest pain, palpitations. Resp: Denies dyspnea or cough.   GI: See HPI Heme: See HPI  Physical Exam: BP 117/75   Pulse 70   Temp (!) 97.5 F (36.4 C) (Temporal)   Ht '4\' 11"'  (1.499 m)   Wt 192 lb 12.8 oz (87.5 kg)   LMP 11/29/2020 (Approximate)   BMI 38.94 kg/m  General:   Alert and oriented. No distress noted. Pleasant and cooperative.  Head:  Normocephalic and atraumatic. Eyes:  Conjuctiva clear without scleral icterus. Heart:  S1, S2 present without murmurs appreciated. Lungs:  Clear to auscultation bilaterally. No wheezes, rales, or rhonchi. No distress.  Abdomen:  +BS, soft, and non-distended.  Mild TTP across lower abdomen and in epigastric area.  No rebound or guarding. No HSM or masses noted. Msk:   Symmetrical without gross deformities. Normal posture. Extremities:  Without edema. Neurologic:  Alert and  oriented x4 Psych:  Normal mood and affect.    Assessment: 25 year old female with history of GERD, H. pylori s/p treatment with documented eradication, abdominal pain, chronic loose stools, and endometriosis presenting today for follow-up of GERD, abdominal pain, and discussed chronic loose stools.   GERD: Chronic.  Uncontrolled.  Previously well controlled on PPI twice daily, now taking omeprazole daily and having daily reflux symptoms.  Dietary habits likely contributing.  No alarm symptoms.  Associated epigastric pain improving.  EGD earlier this year with gastritis biopsied (mild chronic gastritis, negative for H. pylori), normal examined esophagus and duodenum biopsied with benign results.  Will increase PPI to twice daily.  She was counseled on GERD diet/lifestyle.  Chronic loose stools: Predates cholecystectomy.  Currently with 3-6 mushy BMs daily, typically postprandial, and worsens with certain food items such as fatty/greasy foods or dairy products.  Also with lower abdominal pain likely multifactorial discussed below with some relief with bowel movements.  Denies BRBPR, melena, unintentional weight loss.  No family history of IBD.  Differentials include IBS, lactose intolerance, bile salt diarrhea likely contributing, and cannot rule out IBD as abdominal pain is not always related to bowel movements though I feel this is less likely. Previously celiac screen negative, TSH within normal limits, and recent EGD with duodenal biopsies benign.  We will plan to check CRP, ESR, start dicyclomine, and recommended trial of lactose-free diet.  Lower abdominal pain: Chronic.  Likely multifactorial in the setting of suspected endometriosis and possible IBS. Less likely IBD. Interestingly, patient also reports symptoms were not as severe when she was on PPI BID and GERD was well managed.  Following with OBGYN for suspected endometriosis, but unable to take medications at this time as she desires pregnancy.  We will try her on dicyclomine to see if this will provide any relief, increase PPI to BID due to uncontrolled GERD. Also checking ESR and CRP to help rule out IBD in the setting of chronic loose stools.    Plan:  Increase omeprazole to 40 mg twice daily. Counseled on GERD diet/lifestyle.  Written instructions  provided. ESR and CRP. Start dicyclomine 10 mg up to 3 times daily before meals and at bedtime.  Hold in the setting of constipation. Trial lactose-free diet x2 weeks.  Advised to take Lactaid tablets if she may inadvertently consume dairy. Follow-up in 3-4 months.   Aliene Altes, PA-C [facility] 12/27/2020

## 2020-12-27 ENCOUNTER — Encounter: Payer: Self-pay | Admitting: Gastroenterology

## 2020-12-27 ENCOUNTER — Ambulatory Visit: Payer: 59 | Admitting: Gastroenterology

## 2020-12-27 ENCOUNTER — Other Ambulatory Visit: Payer: Self-pay

## 2020-12-27 ENCOUNTER — Ambulatory Visit (INDEPENDENT_AMBULATORY_CARE_PROVIDER_SITE_OTHER): Payer: 59 | Admitting: Gastroenterology

## 2020-12-27 VITALS — BP 117/75 | HR 70 | Temp 97.5°F | Ht 59.0 in | Wt 192.8 lb

## 2020-12-27 DIAGNOSIS — K219 Gastro-esophageal reflux disease without esophagitis: Secondary | ICD-10-CM

## 2020-12-27 DIAGNOSIS — R1030 Lower abdominal pain, unspecified: Secondary | ICD-10-CM | POA: Diagnosis not present

## 2020-12-27 DIAGNOSIS — R195 Other fecal abnormalities: Secondary | ICD-10-CM | POA: Diagnosis not present

## 2020-12-27 MED ORDER — OMEPRAZOLE 40 MG PO CPDR: 40.0000 mg | DELAYED_RELEASE_CAPSULE | Freq: Two times a day (BID) | ORAL | 1 refills | Status: DC

## 2020-12-27 MED ORDER — DICYCLOMINE HCL 10 MG PO CAPS: 10.0000 mg | ORAL_CAPSULE | Freq: Three times a day (TID) | ORAL | 2 refills | Status: DC

## 2020-12-27 NOTE — Patient Instructions (Addendum)
Have blood work completed at [facility].  For reflux: Increase omeprazole to 40 mg twice daily 30 minutes before breakfast and dinner. Follow a GERD diet/lifestyle:  Avoid fried, fatty, greasy, spicy, citrus foods. Avoid caffeine and carbonated beverages. Avoid chocolate. Try eating 4-6 small meals a day rather than 3 large meals. Do not eat within 3 hours of laying down. Prop head of bed up on wood or bricks to create a 6 inch incline.   For loose stools and lower abdominal pain: Start dicyclomine 10 mg up to 3 times daily before meals and at bedtime.  Recommend starting with once daily and increasing as needed.  Hold in the setting of constipation. Try following a lactose-free diet for the next 2 weeks.  If you may inadvertently consume dairy, take Lactaid tablets prior to dairy consumption.   We will plan to see back in 3 months.  Do not hesitate to call if you have questions or concerns prior to your next visit.   It was great meeting you today!  Ermalinda Memos, PA-C [facility]

## 2020-12-29 ENCOUNTER — Other Ambulatory Visit: Payer: Self-pay

## 2020-12-29 ENCOUNTER — Ambulatory Visit: Payer: 59 | Admitting: Family Medicine

## 2020-12-29 ENCOUNTER — Encounter: Payer: Self-pay | Admitting: Family Medicine

## 2020-12-29 VITALS — BP 90/53 | HR 76 | Temp 98.3°F | Wt 194.0 lb

## 2020-12-29 DIAGNOSIS — M654 Radial styloid tenosynovitis [de Quervain]: Secondary | ICD-10-CM | POA: Diagnosis not present

## 2020-12-29 MED ORDER — NAPROXEN 500 MG PO TABS: 500.0000 mg | ORAL_TABLET | Freq: Two times a day (BID) | ORAL | 0 refills | Status: DC

## 2020-12-29 NOTE — Progress Notes (Signed)
      Established patient visit   Patient: Courtney Spencer   DOB: 21-Jun-1995   25 y.o. Female  MRN: [id number] Visit Date: 12/29/2020  Today's healthcare provider: Mila Merry, MD   Chief Complaint  Patient presents with   Hand Pain   Subjective    Hand Pain  Incident onset: Started a few months ago. There was no injury mechanism. Pain location: left thumb. The quality of the pain is described as aching, shooting and stabbing. The pain does not radiate. Treatments tried: Thumb brace.       Medications: Outpatient Medications Prior to Visit  Medication Sig   desloratadine (CLARINEX) 5 MG tablet Take 1 tablet (5 mg total) by mouth daily. (Patient taking differently: Take 5 mg by mouth as needed.)   omeprazole (PRILOSEC) 40 MG capsule Take 1 capsule (40 mg total) by mouth in the morning and at bedtime.   Prenatal Vit-Fe Fumarate-FA (PRENATAL PO) Take by mouth daily.   dicyclomine (BENTYL) 10 MG capsule Take 1 capsule (10 mg total) by mouth 4 (four) times daily -  before meals and at bedtime. (Patient not taking: Reported on 12/29/2020)   No facility-administered medications prior to visit.    Review of Systems  Constitutional: Negative.   Musculoskeletal:  Positive for arthralgias. Negative for back pain, gait problem, joint swelling, myalgias, neck pain and neck stiffness.      Objective    BP (!) 90/53 (BP Location: Right Arm, Patient Position: Sitting, Cuff Size: Large)   Pulse 76   Temp 98.3 F (36.8 C) (Oral)   Wt 194 lb (88 kg)   LMP 11/29/2020 (Approximate)   SpO2 100%   BMI 39.18 kg/m   Positive Finkelstein test on left    Assessment & Plan     1. De Quervain's tenosynovitis, left  - naproxen (NAPROSYN) 500 MG tablet; Take 1 tablet (500 mg total) by mouth 2 (two) times daily with a meal.  Dispense: 30 tablet; Refill: 0        The entirety of the information documented in the History of Present Illness, Review of Systems and Physical Exam  were personally obtained by me. Portions of this information were initially documented by the CMA and reviewed by me for thoroughness and accuracy.     Mila Merry, MD  [facility] [phone] (phone) [phone] (fax)  [facility]

## 2021-01-08 ENCOUNTER — Ambulatory Visit: Payer: 59 | Admitting: Urology

## 2021-05-28 NOTE — Progress Notes (Deleted)
? ? ?Referring Provider: Doreen Beam, F* ?Primary Care Physician:  Doreen Beam, FNP ?Primary GI Physician: Dr. Abbey Chatters ? ?No chief complaint on file. ? ? ?HPI:   ?Courtney Spencer is a 26 y.o. female presenting today for follow-up of GERD, lower abdominal pain, loose stools.  History of cholecystectomy and endometriosis. ? ?EGD 04/21/2020: Small hiatal hernia, gastritis biopsied, normal examined duodenum biopsied, normal esophagus biopsied.  Pathology revealed benign duodenal biopsy, mild chronic gastritis negative for H. pylori, benign esophageal biopsy.   ? ?Last seen in our office November 2022.  She was having frequent heartburn on PPI once daily.  Previously did well on twice daily dosing.  Occasional nausea without vomiting.  Also reported chronic lower abdominal pain described as cramping and intermittent.  Reported previously improved somewhat with PPI and also improved somewhat with bowel movements.  Most severe around the time of her menstrual cycle.  Chronically/mushy stool pre-dating cholecystectomy with 3-6 bowel movements per day, often postprandial, and frequency depending on what she ate, worse with fried/fatty foods or dairy products.  Reported history of endometriosis and was previously prescribed birth control for this, but it did not help.  She was no longer on birth control as she was trying to conceive. Planned to increase omeprazole to twice daily, start dicyclomine, lactose free diet, CRP, ESR, follow-up in 3-4 months.  ? ?Labs were not completed.  ? ?Today:  ? ? ?Past Medical History:  ?Diagnosis Date  ? Allergy   ? Anxiety   ? Cat scratch fever   ? Depression   ? Endometriosis   ? GERD (gastroesophageal reflux disease)   ? Heart murmur of newborn   ? Helicobacter pylori ab+   ? H.pylori serology positive in Jan 2015, treated with Amoxicillin and Flagyl at that time. Returned to clinic Feb 2015 and had rechecked, with a different provider treating with Amoxicillin  and Biaxin. H.pylori stool antigen ordered April 2015 and negative.  ? Hematuria 08/09/2013  ? History of kidney stones   ? Kidney stones 10/26/2013  ? Migraines   ? Other and unspecified ovarian cyst 08/09/2013  ? Ovarian cyst   ? UTI (lower urinary tract infection)   ? ? ?Past Surgical History:  ?Procedure Laterality Date  ? BIOPSY  04/21/2020  ? Procedure: BIOPSY;  Surgeon: Eloise Harman, DO;  Location: [facility];  Service: Endoscopy;;  ? CHOLECYSTECTOMY N/A 05/12/2015  ? Procedure: LAPAROSCOPIC CHOLECYSTECTOMY;  Surgeon: Aviva Signs, MD;  Location: [facility] ORS;  Service: General;  Laterality: N/A;  ? ESOPHAGOGASTRODUODENOSCOPY N/A 08/13/2013  ? Dr. Oneida Alar: nodular gastritis, negative H.pylori  ? ESOPHAGOGASTRODUODENOSCOPY (EGD) WITH PROPOFOL N/A 04/21/2020  ? Surgeon: Eloise Harman, DO; Small hiatal hernia, gastritis biopsied, normal examined duodenum biopsied, normal esophagus biopsied.  Pathology revealed benign duodenal biopsy, mild chronic gastritis negative for H. pylori, benign esophageal biopsy.  ? WISDOM TOOTH EXTRACTION    ? ? ?Current Outpatient Medications  ?Medication Sig Dispense Refill  ? desloratadine (CLARINEX) 5 MG tablet Take 1 tablet (5 mg total) by mouth daily. (Patient taking differently: Take 5 mg by mouth as needed.) 90 tablet 1  ? dicyclomine (BENTYL) 10 MG capsule Take 1 capsule (10 mg total) by mouth 4 (four) times daily -  before meals and at bedtime. (Patient not taking: Reported on 12/29/2020) 90 capsule 2  ? naproxen (NAPROSYN) 500 MG tablet Take 1 tablet (500 mg total) by mouth 2 (two) times daily with a meal. 30 tablet 0  ?  omeprazole (PRILOSEC) 40 MG capsule Take 1 capsule (40 mg total) by mouth in the morning and at bedtime. 180 capsule 1  ? Prenatal Vit-Fe Fumarate-FA (PRENATAL PO) Take by mouth daily.    ? ?No current facility-administered medications for this visit.  ? ? ?Allergies as of 05/30/2021  ? (No Known Allergies)  ? ? ?Family History  ?Problem Relation Age of  Onset  ? Gallbladder disease Mother   ?     gallbladder was removed  ? Migraines Mother   ? Migraines Sister   ? Hypertension Sister   ? Seizures Sister   ? Scoliosis Sister   ? ADD / ADHD Brother   ? ADD / ADHD Brother   ? Hypertension Maternal Grandmother   ? Cancer Maternal Grandfather   ?     lung  ? Cancer Paternal Grandfather   ?     lung  ? Colon cancer Other   ?     unknown  ? Inflammatory bowel disease Neg Hx   ? ? ?Social History  ? ?Socioeconomic History  ? Marital status: Married  ?  Spouse name: Not on file  ? Number of children: Not on file  ? Years of education: Not on file  ? Highest education level: Not on file  ?Occupational History  ? Occupation: student  ?  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   ?Tobacco Use  ? Smoking status: Never  ? Smokeless tobacco: Never  ? Tobacco comments:  ?  Never really smoked  ?Vaping Use  ? Vaping Use: Never used  ?Substance and Sexual Activity  ? Alcohol use: No  ?  Alcohol/week: 0.0 standard drinks  ? Drug use: No  ? Sexual activity: Yes  ?  Partners: Male  ?  Birth control/protection: Pill  ?Other Topics Concern  ? Not on file  ?Social History Narrative  ? Not on file  ? ?Social Determinants of Health  ? ?Financial Resource Strain: Low Risk   ? Difficulty of Paying Living Expenses: Not hard at all  ?Food Insecurity: No Food Insecurity  ? Worried About Charity fundraiser in the Last Year: Never true  ? Ran Out of Food in the Last Year: Never true  ?Transportation Needs: No Transportation Needs  ? Lack of Transportation (Medical): No  ? Lack of Transportation (Non-Medical): No  ?Physical Activity: Insufficiently Active  ? Days of Exercise per Week: 2 days  ? Minutes of Exercise per Session: 30 min  ?Stress: No Stress Concern Present  ? Feeling of Stress : Not at all  ?Social Connections: Moderately Isolated  ? Frequency of Communication with Friends and Family: More than three times a week  ? Frequency of  Social Gatherings with Friends and Family: More than three times a week  ? Attends Religious Services: Never  ? Active Member of Clubs or Organizations: No  ? Attends Archivist Meetings: Never  ? Marital Status: Married  ? ? ?Review of Systems: ?Gen: Denies fever, chills, anorexia. Denies fatigue, weakness, weight loss.  ?CV: Denies chest pain, palpitations, syncope, peripheral edema, and claudication. ?Resp: Denies dyspnea at rest, cough, wheezing, coughing up blood, and pleurisy. ?GI: Denies vomiting blood, jaundice, and fecal incontinence.   Denies dysphagia or odynophagia. ?Derm: Denies rash, itching, dry skin ?Psych: Denies depression, anxiety, memory loss, confusion. No homicidal or suicidal ideation.  ?Heme: Denies bruising, bleeding, and enlarged lymph nodes. ? ?Physical Exam: ?There  were no vitals taken for this visit. ?General:   Alert and oriented. No distress noted. Pleasant and cooperative.  ?Head:  Normocephalic and atraumatic. ?Eyes:  Conjuctiva clear without scleral icterus. ?Heart:  S1, S2 present without murmurs appreciated. ?Lungs:  Clear to auscultation bilaterally. No wheezes, rales, or rhonchi. No distress.  ?Abdomen:  +BS, soft, non-tender and non-distended. No rebound or guarding. No HSM or masses noted. ?Msk:  Symmetrical without gross deformities. Normal posture. ?Extremities:  Without edema. ?Neurologic:  Alert and  oriented x4 ?Psych:  Normal mood and affect. ? ? ? ?Assessment:  ? ? ? ?Plan:  ?*** ? ? ?Aliene Altes, PA-C ?[facility] ?05/30/2021 ? ?

## 2021-05-30 ENCOUNTER — Ambulatory Visit: Payer: 59 | Admitting: Gastroenterology

## 2021-06-08 ENCOUNTER — Ambulatory Visit: Payer: 59 | Admitting: Physician Assistant

## 2021-06-08 ENCOUNTER — Encounter: Payer: Self-pay | Admitting: Physician Assistant

## 2021-06-08 VITALS — BP 96/72 | HR 85 | Temp 98.0°F | Resp 16 | Wt 186.6 lb

## 2021-06-08 DIAGNOSIS — J019 Acute sinusitis, unspecified: Secondary | ICD-10-CM

## 2021-06-08 NOTE — Progress Notes (Signed)
?  ? ? ?I,Courtney Spencer,acting as a Neurosurgeon for [employer], PA-C.,have documented all relevant documentation on the behalf of Debera Lat, PA-C,as directed by  [employer], PA-C while in the presence of [employer], PA-C. ? ?Established patient visit ? ? ?Patient: Courtney Spencer   DOB: 05-24-1995   26 y.o. Female  MRN: [id number] ?Visit Date: 06/08/2021 ? ?Today's healthcare provider: Debera Lat, PA-C  ? ?Chief Complaint  ?Patient presents with  ? Sinus Problem  ? Cough  ? Ear Problem  ? ?Subjective  ?  ?HPI  ?Upper respiratory symptoms ?She complains of left ear pressure/pain, cough described as nonproductive, facial pain, headache described as frontal, and post nasal drip.with no fever, chills, night sweats or weight loss. Onset of symptoms was a few days ago.She is not drinking much.  Past history is significant for no history of pneumonia or bronchitis. Patient is non-smoker ?Taking Claritin.  Teledoc sent in 2 Rx's for a abx and steroid.  Patient has not picked up.   ?Patient also c/o of left ear "feels stuffed up." ?No sick contacts. ? ?---------------------------------------------------------------------------------------------------  ? ? ?Medications: ?Outpatient Medications Prior to Visit  ?Medication Sig  ? desloratadine (CLARINEX) 5 MG tablet Take 1 tablet (5 mg total) by mouth daily. (Patient taking differently: Take 5 mg by mouth as needed.)  ? omeprazole (PRILOSEC) 40 MG capsule Take 1 capsule (40 mg total) by mouth in the morning and at bedtime.  ? amoxicillin-clavulanate (AUGMENTIN) 875-125 MG tablet Take 1 tablet by mouth 2 (two) times daily.  ? dicyclomine (BENTYL) 10 MG capsule Take 1 capsule (10 mg total) by mouth 4 (four) times daily -  before meals and at bedtime. (Patient not taking: Reported on 12/29/2020)  ? methylPREDNISolone (MEDROL DOSEPAK) 4 MG TBPK tablet Take by mouth.  ? naproxen (NAPROSYN) 500 MG tablet Take 1 tablet (500 mg total) by mouth 2 (two) times daily  with a meal. (Patient not taking: Reported on 06/08/2021)  ? Prenatal Vit-Fe Fumarate-FA (PRENATAL PO) Take by mouth daily.  ? ?No facility-administered medications prior to visit.  ? ? ?Review of Systems ? ?  Objective  ?  ?BP 96/72 (BP Location: Right Arm, Patient Position: Sitting, Cuff Size: Large)   Pulse 85   Temp 98 ?F (36.7 ?C) (Oral)   Resp 16   Wt 186 lb 9.6 oz (84.6 kg)   SpO2 99%   BMI 37.69 kg/m?  ? ? ?Physical Exam ?Vitals and nursing note reviewed.  ?Constitutional:   ?   Appearance: Normal appearance.  ?HENT:  ?   Head: Normocephalic and atraumatic.  ?   Right Ear: Tympanic membrane normal.  ?   Ears:  ?   Comments:  fluids behind TM ?Cardiovascular:  ?   Rate and Rhythm: Normal rate and regular rhythm.  ?   Pulses: Normal pulses.  ?   Heart sounds: Normal heart sounds.  ?Pulmonary:  ?   Effort: Pulmonary effort is normal.  ?   Breath sounds: Normal breath sounds.  ?Neurological:  ?   Mental Status: She is alert.  ?Psychiatric:     ?   Behavior: Behavior normal.     ?   Thought Content: Thought content normal.     ?   Judgment: Judgment normal.  ?  ? ? ?No results found for any visits on 06/08/21. ? Assessment & Plan  ?  ? ?1. Acute rhinosinusitis ?Suspected due to allergic nature of sinusitis vs infectious ?Continue symptomatic treatment:  proper hydration, antihistamines and Flonase, over-the-counter decongestants and cough suppressant ? ?FU as discussed ?   ?The patient was advised to call back or seek an in-person evaluation if the symptoms worsen or if the condition fails to improve as anticipated. ? ?I discussed the assessment and treatment plan with the patient. The patient was provided an opportunity to ask questions and all were answered. The patient agreed with the plan and demonstrated an understanding of the instructions. ? ?The entirety of the information documented in the History of Present Illness, Review of Systems and Physical Exam were personally obtained by me. Portions of this  information were initially documented by the CMA and reviewed by me for thoroughness and accuracy.   ? ? ?Debera Lat, PA-C  ?[facility] ?[phone] (phone) ?[phone] (fax) ? ?[facility] ?

## 2021-06-27 ENCOUNTER — Encounter: Payer: 59 | Admitting: Physician Assistant

## 2021-09-18 ENCOUNTER — Ambulatory Visit (INDEPENDENT_AMBULATORY_CARE_PROVIDER_SITE_OTHER): Payer: 59 | Admitting: Adult Health

## 2021-09-18 ENCOUNTER — Encounter: Payer: Self-pay | Admitting: Adult Health

## 2021-09-18 VITALS — BP 107/75 | HR 66 | Ht 59.0 in | Wt 190.5 lb

## 2021-09-18 DIAGNOSIS — R102 Pelvic and perineal pain unspecified side: Secondary | ICD-10-CM

## 2021-09-18 DIAGNOSIS — Z319 Encounter for procreative management, unspecified: Secondary | ICD-10-CM

## 2021-09-18 DIAGNOSIS — N946 Dysmenorrhea, unspecified: Secondary | ICD-10-CM

## 2021-09-18 DIAGNOSIS — G8929 Other chronic pain: Secondary | ICD-10-CM

## 2021-09-18 DIAGNOSIS — N941 Unspecified dyspareunia: Secondary | ICD-10-CM | POA: Diagnosis not present

## 2021-09-18 DIAGNOSIS — N809 Endometriosis, unspecified: Secondary | ICD-10-CM

## 2021-09-18 NOTE — Progress Notes (Signed)
  Subjective:     Patient ID: Courtney Spencer, female   DOB: 07/13/1995, 26 y.o.   MRN: [id number]  HPI Courtney Spencer is a 26 year old white female, married, G0P0, in complaining of pelvic pain radiates to vagina and back, pain with sex period and pain with sex.  Lab Results  Component Value Date   DIAGPAP  12/10/2019    - Negative for intraepithelial lesion or malignancy (NILM)   HPVHIGH Negative 12/10/2019   PCP is M Flinchum NP Review of Systems Chronic pelvic pain, radiates to vaginal and back Pain with sex Pain with periods Pain with orgasm Periods sometimes twice a month Reviewed past medical,surgical, social and family history. Reviewed medications and allergies.     Objective:   Physical Exam BP 107/75 (BP Location: Left Arm, Patient Position: Sitting, Cuff Size: Normal)   Pulse 66   Ht 4\' 11"  (1.499 m)   Wt 190 lb 8 oz (86.4 kg)   LMP 08/23/2021   BMI 38.48 kg/m     Skin warm and dry.Pelvic: external genitalia is normal in appearance no lesions, vagina: scant white discharge without odor, +tenderness right side wall and urethra,urethra has no lesions or masses noted, cervix:smooth, uterus: normal size, shape and contour, + tender, no masses felt, adnexa: no masses or tenderness noted. Bladder is + tender and no masses felt.  Fall risk is low  Upstream - 09/18/21 1024       Pregnancy Intention Screening   Does the patient want to become pregnant in the next year? Ok Either Way    Does the patient's partner want to become pregnant in the next year? Ok Either 09/20/21      Contraception Wrap Up   Current Method No Method - Other Reason    End Method No Method - Other Reason            Examination chaperoned by [employer] LPN  Assessment:     1. Chronic pelvic pain in female Has pelvic pain radiates to vagina and back  Has had pain years now, was better on COs  2. Dyspareunia in female Has pain with orgasm too and foreplay   3. Dysmenorrhea Has pain  with periods   4. Patient desires pregnancy  5. Endometriosis I suspect endometriosis was better when on OCs Review handout    Discussed medication, but since she wants to get pregnant, will get appt with Dr Malachy Mood to discuss  Plan:     Return in 2 weeks to discuss options  with Dr Charlotta Newton

## 2021-10-01 ENCOUNTER — Ambulatory Visit (INDEPENDENT_AMBULATORY_CARE_PROVIDER_SITE_OTHER): Payer: 59 | Admitting: Obstetrics & Gynecology

## 2021-10-01 ENCOUNTER — Encounter: Payer: Self-pay | Admitting: Obstetrics & Gynecology

## 2021-10-01 VITALS — BP 110/77 | HR 88 | Ht 59.0 in | Wt 191.4 lb

## 2021-10-01 DIAGNOSIS — N809 Endometriosis, unspecified: Secondary | ICD-10-CM | POA: Diagnosis not present

## 2021-10-01 DIAGNOSIS — N946 Dysmenorrhea, unspecified: Secondary | ICD-10-CM | POA: Diagnosis not present

## 2021-10-01 DIAGNOSIS — N941 Unspecified dyspareunia: Secondary | ICD-10-CM

## 2021-10-01 NOTE — Progress Notes (Signed)
GYN VISIT Patient name: Courtney Spencer MRN [id number]  Date of birth: October 12, 1995 Chief Complaint:   Follow-up (On possible endometriosis; discuss options)  History of Present Illness:   Courtney Spencer is a 26 y.o. G0P0 female being seen today for the following concerns:  -Pelvic pain: Ongoing issue that occurs mostly during IC and on occasion with increased activity.  Menses regular each month with considerable dysmenorrhea.  Also noted dyspareunia.  During her menses will also note pain with BM and/or diarrhea.  She tries not to take pain medication if possible, but on occasion will take OTC ibuprofen or tylenol.  Previously on OCPs, but stopped about 6 mos ago as she desires a pregnancy.  Of note, they have never tried to become pregnant- always used protection.  Same partner x 34yrs.  Partner is 29yo- no prior children, no prior medical issues  Last [place] 07/2018- normal- no acute abnormalities noted    Patient's last menstrual period was 09/25/2021.     06/08/2021    1:13 PM 12/29/2020    2:57 PM 12/11/2020    8:42 AM 12/10/2019    1:04 PM 11/19/2019    9:43 AM  Depression screen PHQ 2/9  Decreased Interest 0 0 0 1 2  Down, Depressed, Hopeless 0 0 0 1 1  PHQ - 2 Score 0 0 0 2 3  Altered sleeping 1 0  2 3  Tired, decreased energy 2 0  2 3  Change in appetite 1 0  0 0  Feeling bad or failure about yourself  0 0  0 1  Trouble concentrating 0 0  0 0  Moving slowly or fidgety/restless 0 0  0 0  Suicidal thoughts 0 0  0 0  PHQ-9 Score 4 0  6 10  Difficult doing work/chores Not difficult at all Not difficult at all   Not difficult at all     Review of Systems:   Pertinent items are noted in HPI Denies fever/chills, dizziness, headaches, visual disturbances, fatigue, shortness of breath, chest pain or vomiting. Pertinent History Reviewed:  Reviewed past medical,surgical, social, obstetrical and family history.  Reviewed problem list, medications and  allergies. Physical Assessment:   Vitals:   10/01/21 1547  BP: 110/77  Pulse: 88  Weight: 191 lb 6.4 oz (86.8 kg)  Height: 4\' 11"  (1.499 m)  Body mass index is 38.66 kg/m.       Physical Examination:   General appearance: alert, well appearing, and in no distress  Psych: mood appropriate, normal affect  Skin: warm & dry   Cardiovascular: normal heart rate noted  Respiratory: normal respiratory effort, no distress  Abdomen: soft, no rebound, no guarding, Notes significant tenderness with palpation   Pelvic: VULVA: normal appearing vulva with no masses, tenderness or lesions, VAGINA: normal appearing vagina with normal color and discharge, no lesions.  On bimanual exam noted point tenderness at introitus and along uterosacral ligament, mostly on left side.  Extremities: no edema   Chaperone: N/A    Assessment & Plan:  1) Dyspareunia, dysmenorrhea- suspected endometriosis -reviewed diagnosis and treatment options -since desires pregnancy- main focus will be fertility -encouraged tracking menses, ovulation window and PNV daily -plan to f/u in 72mos -ok for NSAIDs and heating pack  -briefly discussed surgery however since that would not change management at this time first step is to actively try for pregnancy -Questions and concerns were addressed  -due to trigger point pain, referral created to pelvic floor  therapy   Orders Placed This Encounter  Procedures   Ambulatory referral to Physical Therapy    Return in about 6 months (around 04/03/2022) for family planning follow up.   Myna Hidalgo, DO Attending Obstetrician & Gynecologist, [facility] for [employer], [facility]

## 2021-11-06 NOTE — Addendum Note (Signed)
Addended by: Sharon Seller on: 11/06/2021 02:49 PM   Modules accepted: Orders

## 2021-11-27 NOTE — Progress Notes (Deleted)
Referring Provider: No ref. provider found Primary Care Physician:  Doreen Beam, FNP (Inactive) Primary GI Physician: Dr. Abbey Chatters  No chief complaint on file.   HPI:   Courtney Spencer is a 26 y.o. female presenting today for follow-up. ***She has history of GERD, lower abdominal pain, chronic loose stools predating cholecystectomy.  Also with reported history of endometriosis as well as chronic pelvic pain, dysmenorrhea.    Prior EGD 04/21/2020: Small hiatal hernia, gastritis biopsied, normal examined duodenum biopsied, normal esophagus biopsied.  Pathology revealed benign duodenal biopsy, mild chronic gastritis negative for H. pylori, benign esophageal biopsy.     Last seen in our office November 2022.  She was having frequent heartburn on PPI once daily.  Previously did well on twice daily dosing.  Occasional nausea without vomiting.  Also reported chronic lower abdominal pain described as cramping and intermittent.  Reported previously improved somewhat with PPI and also improved somewhat with bowel movements.  Most severe around the time of her menstrual cycle.  Chronically/mushy stool pre-dating cholecystectomy with 3-6 bowel movements per day, often postprandial, and frequency depending on what she ate, worse with fried/fatty foods or dairy products.  Reported history of endometriosis and was previously prescribed birth control for this, but it did not help.  She was no longer on birth control as she was trying to conceive. Planned to increase omeprazole to twice daily, start dicyclomine, lactose free diet, CRP, ESR, follow-up in 3-4 months.   Labs were not completed.    Today:   GERD:   Loose stools:     Past Medical History:  Diagnosis Date   Allergy    Anxiety    Cat scratch fever    Depression    Endometriosis    GERD (gastroesophageal reflux disease)    Heart murmur of newborn    Helicobacter pylori ab+    H.pylori serology positive in Jan 2015,  treated with Amoxicillin and Flagyl at that time. Returned to clinic Feb 2015 and had rechecked, with a different provider treating with Amoxicillin and Biaxin. H.pylori stool antigen ordered April 2015 and negative.   Hematuria 08/09/2013   History of kidney stones    Kidney stones 10/26/2013   Migraines    Other and unspecified ovarian cyst 08/09/2013   Ovarian cyst    UTI (lower urinary tract infection)     Past Surgical History:  Procedure Laterality Date   BIOPSY  04/21/2020   Procedure: BIOPSY;  Surgeon: Eloise Harman, DO;  [facility];  Service: Endoscopy;;   CHOLECYSTECTOMY N/A 05/12/2015   Procedure: LAPAROSCOPIC CHOLECYSTECTOMY;  Surgeon: Aviva Signs, MD;  Location: [facility];  Service: General;  Laterality: N/A;   ESOPHAGOGASTRODUODENOSCOPY N/A 08/13/2013   Dr. Oneida Alar: nodular gastritis, negative H.pylori   ESOPHAGOGASTRODUODENOSCOPY (EGD) WITH PROPOFOL N/A 04/21/2020   Surgeon: Eloise Harman, DO; Small hiatal hernia, gastritis biopsied, normal examined duodenum biopsied, normal esophagus biopsied.  Pathology revealed benign duodenal biopsy, mild chronic gastritis negative for H. pylori, benign esophageal biopsy.   WISDOM TOOTH EXTRACTION      Current Outpatient Medications  Medication Sig Dispense Refill   desloratadine (CLARINEX) 5 MG tablet Take 1 tablet (5 mg total) by mouth daily. (Patient taking differently: Take 5 mg by mouth as needed.) 90 tablet 1   omeprazole (PRILOSEC) 40 MG capsule Take 1 capsule (40 mg total) by mouth in the morning and at bedtime. 180 capsule 1   No current facility-administered medications for this visit.  Allergies as of 11/28/2021   (No Known Allergies)    Family History  Problem Relation Age of Onset   Gallbladder disease Mother        gallbladder was removed   Migraines Mother    Migraines Sister    Hypertension Sister    Seizures Sister    Scoliosis Sister    ADD / ADHD Brother    ADD / ADHD Brother     Hypertension Maternal Grandmother    Cancer Maternal Grandfather        lung   Cancer Paternal Grandfather        lung   Colon cancer Other        unknown   Inflammatory bowel disease Neg Hx     Social History   Socioeconomic History   Marital status: Married    Spouse name: Not on file   Number of children: Not on file   Years of education: Not on file   Highest education level: Not on file  Occupational History   Occupation: 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   Tobacco Use   Smoking status: Never   Smokeless tobacco: Never   Tobacco comments:    Never really smoked  Vaping Use   Vaping Use: Never used  Substance and Sexual Activity   Alcohol use: No    Alcohol/week: 0.0 standard drinks of alcohol   Drug use: No   Sexual activity: Yes    Partners: Male    Birth control/protection: None  Other Topics Concern   Not on file  Social History Narrative   Not on file   Social Determinants of Health   Financial Resource Strain: Low Risk  (12/11/2020)   Overall Financial Resource Strain (CARDIA)    Difficulty of Paying Living Expenses: Not hard at all  Food Insecurity: No Food Insecurity (12/11/2020)   Hunger Vital Sign    Worried About Running Out of Food in the Last Year: Never true    Ran Out of Food in the Last Year: Never true  Transportation Needs: No Transportation Needs (12/11/2020)   PRAPARE - Hydrologist (Medical): No    Lack of Transportation (Non-Medical): No  Physical Activity: Insufficiently Active (12/11/2020)   Exercise Vital Sign    Days of Exercise per Week: 2 days    Minutes of Exercise per Session: 30 min  Stress: No Stress Concern Present (12/11/2020)   [place]    Feeling of Stress : Not at all  Social Connections: Moderately Isolated (12/11/2020)   Social Connection and  Isolation Panel [NHANES]    Frequency of Communication with Friends and Family: More than three times a week    Frequency of Social Gatherings with Friends and Family: More than three times a week    Attends Religious Services: Never    Marine scientist or Organizations: No    Attends Music therapist: Never    Marital Status: Married    Review of Systems: Gen: Denies fever, chills, cold or flulike symptoms, presyncope, syncope. CV: Denies chest pain, palpitations. Resp: Denies dyspnea, cough.  GI See HPI Heme: See HPI  Physical Exam: There were no vitals taken for this visit. General:   Alert and oriented. No distress noted. Pleasant and cooperative.  Head:  Normocephalic and atraumatic. Eyes:  Conjuctiva clear without scleral icterus.  Heart:  S1, S2 present without murmurs appreciated. Lungs:  Clear to auscultation bilaterally. No wheezes, rales, or rhonchi. No distress.  Abdomen:  +BS, soft, non-tender and non-distended. No rebound or guarding. No HSM or masses noted. Msk:  Symmetrical without gross deformities. Normal posture. Extremities:  Without edema. Neurologic:  Alert and  oriented x4 Psych:  Normal mood and affect.    Assessment:     Plan:  ***   Aliene Altes, PA-C [facility] 11/28/2021

## 2021-11-28 ENCOUNTER — Ambulatory Visit: Payer: 59 | Admitting: Gastroenterology

## 2022-01-01 ENCOUNTER — Telehealth (INDEPENDENT_AMBULATORY_CARE_PROVIDER_SITE_OTHER): Payer: 59 | Admitting: Gastroenterology

## 2022-01-01 ENCOUNTER — Telehealth: Payer: Self-pay | Admitting: *Deleted

## 2022-01-01 ENCOUNTER — Encounter: Payer: Self-pay | Admitting: Gastroenterology

## 2022-01-01 VITALS — Ht 59.0 in | Wt 190.0 lb

## 2022-01-01 DIAGNOSIS — R195 Other fecal abnormalities: Secondary | ICD-10-CM | POA: Diagnosis not present

## 2022-01-01 DIAGNOSIS — K219 Gastro-esophageal reflux disease without esophagitis: Secondary | ICD-10-CM | POA: Diagnosis not present

## 2022-01-01 MED ORDER — OMEPRAZOLE 40 MG PO CPDR: 40.0000 mg | DELAYED_RELEASE_CAPSULE | Freq: Two times a day (BID) | ORAL | 1 refills | Status: DC

## 2022-01-01 NOTE — Telephone Encounter (Signed)
Courtney Spencer, you are scheduled for a virtual visit with your provider today.  Just as we do with appointments in the office, we must obtain your consent to participate.  Your consent will be active for this visit and any virtual visit you may have with one of our providers in the next 365 days.  If you have a MyChart account, I can also send a copy of this consent to you electronically.  All virtual visits are billed to your insurance company just like a traditional visit in the office.  As this is a virtual visit, video technology does not allow for your provider to perform a traditional examination.  This may limit your provider's ability to fully assess your condition.  If your provider identifies any concerns that need to be evaluated in person or the need to arrange testing such as labs, EKG, etc, we will make arrangements to do so.  Although advances in technology are sophisticated, we cannot ensure that it will always work on either your end or our end.  If the connection with a video visit is poor, we may have to switch to a telephone visit.  With either a video or telephone visit, we are not always able to ensure that we have a secure connection.   I need to obtain your verbal consent now.   Are you willing to proceed with your visit today? Patient consent to virtual video visit.

## 2022-01-01 NOTE — Progress Notes (Signed)
Primary Care Physician:  Doreen Beam, FNP  Primary Gastroenterologist: Elon Alas.Abbey Chatters, DO  Patient Location: Home Reason for Visit: reflux  Persons present on the virtual encounter, with roles: Patient - Courtney Spencer; Provider - Venetia Night, NP   Total time (minutes) spent on medical discussion: 13 minutes  Virtual Visit Encounter Note Visit is conducted virtually and was requested by patient.   I connected with Courtney Spencer on 01/01/22 at  3:30 PM EST by video and verified that I am speaking with the correct person using two identifiers.   I discussed the limitations, risks, security and privacy concerns of performing an evaluation and management service by video and the availability of in person appointments. I also discussed with the patient that there may be a patient responsible charge related to this service. The patient expressed understanding and agreed to proceed.  Chief Complaint  Patient presents with   Gastroesophageal Reflux    Heart burn is still really bad.     History of Present Illness: Courtney Spencer is a 26 y.o. female with a history of GERD, H. pylori s/p treatment and documented eradication, abdominal pain, chronic loose stools, and endometriosis presenting for virtual visit today to discuss reflux.  EGD 04/21/2020: -Small hiatal hernia - Gastritis s/p biopsy (chronic gastritis negative for H. Pylori) - Normal duodenum/P biopsy (benign) - Normal esophagus s/p biopsy (benign) - Recommended PPI twice daily for 8 weeks and then decrease to once daily.  Last office visit 12/27/2020.  Patient reported everything was giving her heartburn and had done well on PPI twice daily.  Not experiencing any dysphagia.  Was having occasional nausea without vomiting and improved epigastric pain but still tender on palpation.  Ongoing chronic abdominal pain that continued after cholecystectomy.  Intermittent cramping that occurs  daily.  Somewhat improved with PPI twice daily seem to be worse with her.  And some relief with bowel movements.  Noted chronic loose stools 3-6 bowel movements per day usually postprandial, which was occurring prior to cholecystectomy.  Pain is usually 5-6/10.  Denied BRBPR or melena.  Noted NSAID use about once per month.  Denied any family history of IBD.  Advised to increase omeprazole to 40 mg twice daily.  GERD lifestyle/diet reinforced.  ESR and CRP checked and was given dicyclomine 10 mg up to 3 times daily and at bedtime and to follow a lactose-free diet.  ESR and CRP never completed.  Today: GERD - Ran out of omeprazole but was previously taking it twice daily. Has been trying to eliminate certain foods and triggers for her such as chocolate. Would like to start again back at twice daily until she has consistent dietary changes.   Denies vomiting but has been nauseas and in the process of trying to get pregnant and unsure if that is related. Denies lack of appetite and weight has been stable. Denies any diarrhea or constipation but does report some urgency since having her gallbladder. Notices that when she eats salad or thing with cheese and milk make her have to go to. Primarily drinks water. Stools are looser and has a couple per day. Will wait to eat meals until after work so she does not have to use the bathroom interrupting her day.     Medications Current Meds  Medication Sig   omeprazole (PRILOSEC) 40 MG capsule Take 1 capsule (40 mg total) by mouth in the morning and at bedtime.     History  Past Medical History:  Diagnosis Date   Allergy    Anxiety    Cat scratch fever    Depression    Endometriosis    GERD (gastroesophageal reflux disease)    Heart murmur of newborn    Helicobacter pylori ab+    H.pylori serology positive in Jan 2015, treated with Amoxicillin and Flagyl at that time. Returned to clinic Feb 2015 and had rechecked, with a different provider treating with  Amoxicillin and Biaxin. H.pylori stool antigen ordered April 2015 and negative.   Hematuria 08/09/2013   History of kidney stones    Kidney stones 10/26/2013   Migraines    Other and unspecified ovarian cyst 08/09/2013   Ovarian cyst    UTI (lower urinary tract infection)     Past Surgical History:  Procedure Laterality Date   BIOPSY  04/21/2020   Procedure: BIOPSY;  Surgeon: Eloise Harman, DO;  [facility];  Service: Endoscopy;;   CHOLECYSTECTOMY N/A 05/12/2015   Procedure: LAPAROSCOPIC CHOLECYSTECTOMY;  Surgeon: Aviva Signs, MD;  Location: [facility];  Service: General;  Laterality: N/A;   ESOPHAGOGASTRODUODENOSCOPY N/A 08/13/2013   Dr. Oneida Alar: nodular gastritis, negative H.pylori   ESOPHAGOGASTRODUODENOSCOPY (EGD) WITH PROPOFOL N/A 04/21/2020   Surgeon: Eloise Harman, DO; Small hiatal hernia, gastritis biopsied, normal examined duodenum biopsied, normal esophagus biopsied.  Pathology revealed benign duodenal biopsy, mild chronic gastritis negative for H. pylori, benign esophageal biopsy.   WISDOM TOOTH EXTRACTION      Family History  Problem Relation Age of Onset   Gallbladder disease Mother        gallbladder was removed   Migraines Mother    Migraines Sister    Hypertension Sister    Seizures Sister    Scoliosis Sister    ADD / ADHD Brother    ADD / ADHD Brother    Hypertension Maternal Grandmother    Cancer Maternal Grandfather        lung   Cancer Paternal Grandfather        lung   Colon cancer Other        unknown   Inflammatory bowel disease Neg Hx     Social History   Socioeconomic History   Marital status: Married    Spouse name: Not on file   Number of children: Not on file   Years of education: Not on file   Highest education level: Not on file  Occupational History   Occupation: student    Comment: Early Futures trader, 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   Tobacco Use   Smoking status: Never   Smokeless  tobacco: Never   Tobacco comments:    Never really smoked  Vaping Use   Vaping Use: Never used  Substance and Sexual Activity   Alcohol use: No    Alcohol/week: 0.0 standard drinks of alcohol   Drug use: No   Sexual activity: Yes    Partners: Male    Birth control/protection: None  Other Topics Concern   Not on file  Social History Narrative   Not on file   Social Determinants of Health   Financial Resource Strain: Low Risk  (12/11/2020)   Overall Financial Resource Strain (CARDIA)    Difficulty of Paying Living Expenses: Not hard at all  Food Insecurity: No Food Insecurity (12/11/2020)   Hunger Vital Sign    Worried About Running Out of Food in the Last Year: Never true    Ran Out  of Food in the Last Year: Never true  Transportation Needs: No Transportation Needs (12/11/2020)   PRAPARE - Hydrologist (Medical): No    Lack of Transportation (Non-Medical): No  Physical Activity: Insufficiently Active (12/11/2020)   Exercise Vital Sign    Days of Exercise per Week: 2 days    Minutes of Exercise per Session: 30 min  Stress: No Stress Concern Present (12/11/2020)   [place]    Feeling of Stress : Not at all  Social Connections: Moderately Isolated (12/11/2020)   Social Connection and Isolation Panel [NHANES]    Frequency of Communication with Friends and Family: More than three times a week    Frequency of Social Gatherings with Friends and Family: More than three times a week    Attends Religious Services: Never    Marine scientist or Organizations: No    Attends Archivist Meetings: Never    Marital Status: Married      Review of Systems: Gen: Denies fever, chills, anorexia. Denies fatigue, weakness, weight loss.  CV: Denies chest pain, palpitations, syncope, peripheral edema, and claudication. Resp: Denies dyspnea at rest, cough, wheezing, coughing up  blood, and pleurisy. GI: see HPI Derm: Denies rash, itching, dry skin Psych: Denies depression, anxiety, memory loss, confusion. No homicidal or suicidal ideation.  Heme: Denies bruising, bleeding, and enlarged lymph nodes.  Observations/Objective: No distress. Alert and oriented. Pleasant. Well nourished. Normal mood and affect. Unable to perform complete physical exam due to video encounter.   Assessment:  GERD: Previously controlled on omeprazole 40 mg twice daily.  Had recently ran out and was experiencing worsening symptoms.  She has been working on eliminating common triggers such as chocolate.  Currently without any vomiting but has been experiencing some intermittent nausea but given she is in the process of trying to get pregnant she is unsure of that is related.  She would like to start back at twice daily dosing and then consider weaning after she has had better improvement in her diet.  We will refill omeprazole 40 mg twice daily.  Loose stools: Denies constipation but reports postprandial fecal urgency and looser stools.  Generally is only eating after her workday to avoid having bowel movements at work.  Does notice that salads, cheeses, dairy products tend to cause increased urgency.  Primarily is only drinking water.  Not currently taking dicyclomine.  Denies any abdominal pain or BRBPR.  Symptoms seem to be largely dependent on dietary factors.  Symptoms were occurring prior to cholecystectomy but this could be a contributing factor.  Prior work-up negative for celiac and TSH within normal limits.  Most recent EGD with benign duodenal biopsies.  Advised to avoid lactose products or to take Lactaid prior to consumption, advised to follow directions on the box.  May also benefit from trial of low FODMAP diet with slow elimination.  Overall fairly stable without any alarm symptoms.  Plan:  Omeprazole 40 mg twice daily GERD diet/lifestyle modifications Lactacid prior to dairy products  or avoid lactose products  Trial low FODMAP diet.  Follow up in 1 year or sooner if needed.    Follow Up Instructions:  I discussed the assessment and treatment plan with the patient. The patient was provided an opportunity to ask questions and all were answered. The patient agreed with the plan and demonstrated an understanding of the instructions.   The patient was advised to call back  or seek an in-person evaluation if the symptoms worsen or if the condition fails to improve as anticipated.    Venetia Night, MSN, APRN, FNP-BC, AGACNP-BC [facility]

## 2022-01-01 NOTE — Patient Instructions (Addendum)
Continue omeprazole 40 mg twice daily, I sent a refill to your pharmacy.  Follow a GERD diet:  Avoid fried, fatty, greasy, spicy, citrus foods. Avoid caffeine and carbonated beverages. Avoid chocolate. Try eating 4-6 small meals a day rather than 3 large meals. Do not eat within 3 hours of laying down. Prop head of bed up on wood or bricks to create a 6 inch incline.   Avoid lactose/dairy products as much as able.  If you are going to consume any you may try Lactaid tablets about 15 minutes prior to consumption to see if this improves some of your urgency after meals or your looser stools.  If your symptoms worsen or become more bothersome we could potentially try a bile acid binding agent such as cholestyramine/colestipol.  I have course would recommend these dietary changes first.  You may also benefit from a low FODMAP diet which I briefly discussed with you during the visit today and I am providing you with a handout. You could eliminate foods in a certain food group first and slowly reintroduce to find other triggers.   It was a pleasure to see you today. I want to create trusting relationships with patients. If you receive a survey regarding your visit,  I greatly appreciate you taking time to fill this out on paper or through your MyChart. I value your feedback.  Venetia Night, MSN, FNP-BC, AGACNP-BC [facility]

## 2022-01-14 ENCOUNTER — Other Ambulatory Visit (HOSPITAL_COMMUNITY)
Admission: RE | Admit: 2022-01-14 | Discharge: 2022-01-14 | Disposition: A | Discharge status: Home / Self Care | Payer: 59 | Source: Ambulatory Visit | Attending: Adult Health | Admitting: Adult Health

## 2022-01-14 ENCOUNTER — Ambulatory Visit (INDEPENDENT_AMBULATORY_CARE_PROVIDER_SITE_OTHER): Payer: 59 | Admitting: Adult Health

## 2022-01-14 ENCOUNTER — Encounter: Payer: Self-pay | Admitting: Adult Health

## 2022-01-14 VITALS — BP 106/80 | HR 66 | Ht 59.0 in | Wt 192.0 lb

## 2022-01-14 DIAGNOSIS — Z113 Encounter for screening for infections with a predominantly sexual mode of transmission: Secondary | ICD-10-CM

## 2022-01-14 DIAGNOSIS — Z319 Encounter for procreative management, unspecified: Secondary | ICD-10-CM

## 2022-01-14 DIAGNOSIS — Z01419 Encounter for gynecological examination (general) (routine) without abnormal findings: Secondary | ICD-10-CM

## 2022-01-14 DIAGNOSIS — Z3009 Encounter for other general counseling and advice on contraception: Secondary | ICD-10-CM

## 2022-01-14 NOTE — Progress Notes (Signed)
Patient ID: Courtney Spencer, female   DOB: 1995/06/20, 26 y.o.   MRN: [id number] History of Present Illness: Courtney Spencer is a 26 year old white female,married, G0P0, in for a well woman gyn exam.  Last pap was negative HPV and malignancy 12/10/19.  No current PCP.  Current Medications, Allergies, Past Medical History, Past Surgical History, Family History and Social History were reviewed in [employer] record.     Review of Systems:  Patient denies any headaches, hearing loss, fatigue, blurred vision, shortness of breath, chest pain, abdominal pain, problems with bowel movements, urination, or intercourse. No joint pain or mood swings. Periods are regular.   Physical Exam:BP 106/80 (BP Location: Left Arm, Patient Position: Sitting, Cuff Size: Normal)   Pulse 66   Ht 4\' 11"  (1.499 m)   Wt 192 lb (87.1 kg)   LMP 12/26/2021 (Approximate)   BMI 38.78 kg/m   General:  Well developed, well nourished, no acute distress Skin:  Warm and dry Neck:  Midline trachea, normal thyroid, good ROM, no lymphadenopathy Lungs; Clear to auscultation bilaterally Breast:  No dominant palpable mass, retraction, or nipple discharge Cardiovascular: Regular rate and rhythm Abdomen:  Soft, non tender, no hepatosplenomegaly Pelvic:  External genitalia is normal in appearance, no lesions.  The vagina is normal in appearance. Urethra has no lesions or masses. The cervix is smooth, CV swab obtained.Marland Kitchen  Uterus is felt to be normal size, shape, and contour.  No adnexal masses or tenderness noted.Bladder is non tender, no masses felt. Rectal: Deferred  Extremities/musculoskeletal:  No swelling or varicosities noted, no clubbing or cyanosis Psych:  No mood changes, alert and cooperative,seems happy AA is 0 Fall risk is low    01/14/2022    3:32 PM 06/08/2021    1:13 PM 12/29/2020    2:57 PM  Depression screen PHQ 2/9  Decreased Interest 1 0 0  Down, Depressed, Hopeless 1 0 0  PHQ - 2  Score 2 0 0  Altered sleeping 1 1 0  Tired, decreased energy 1 2 0  Change in appetite 0 1 0  Feeling bad or failure about yourself  1 0 0  Trouble concentrating 0 0 0  Moving slowly or fidgety/restless 0 0 0  Suicidal thoughts 0 0 0  PHQ-9 Score 5 4 0  Difficult doing work/chores  Not difficult at all Not difficult at all       01/14/2022    3:32 PM 12/10/2019    1:04 PM  GAD 7 : Generalized Anxiety Score  Nervous, Anxious, on Edge 1 1  Control/stop worrying 1 1  Worry too much - different things 1 0  Trouble relaxing 1 0  Restless 0 0  Easily annoyed or irritable 0 0  Afraid - awful might happen 0 2  Total GAD 7 Score 4 4    Upstream - 01/14/22 1531       Pregnancy Intention Screening   Does the patient want to become pregnant in the next year? Yes    Does the patient's partner want to become pregnant in the next year? Yes    Would the patient like to discuss contraceptive options today? No      Contraception Wrap Up   Current Method Pregnant/Seeking Pregnancy    End Method Pregnant/Seeking Pregnancy              Examination chaperoned by Levy Pupa LPN   Impression and Plan: 1. Screening examination for STD (sexually transmitted disease)  CV swab sent for GC/CHL,trich  2. Family planning   3. Encounter for well woman exam with routine gynecological exam Pap and physical in 1 year   4. Patient desires pregnancy Take PNV If not pregnant after 18 months of trying, can check progesterone level to assess for ovulation and consider SA.

## 2022-01-16 LAB — CERVICOVAGINAL ANCILLARY ONLY
Chlamydia: NEGATIVE
Comment: NEGATIVE
Comment: NEGATIVE
Comment: NORMAL
Neisseria Gonorrhea: NEGATIVE
Trichomonas: NEGATIVE

## 2022-08-01 IMAGING — CR DG ABDOMEN 1V
1 series · 1 of 1 positions shown · non-contrast
Comparison: 07/31/2013

CLINICAL DATA: Lower abdominal pain for 1 week, history of kidney
stones

EXAM:
ABDOMEN - 1 VIEW

[dg abd 1 view]
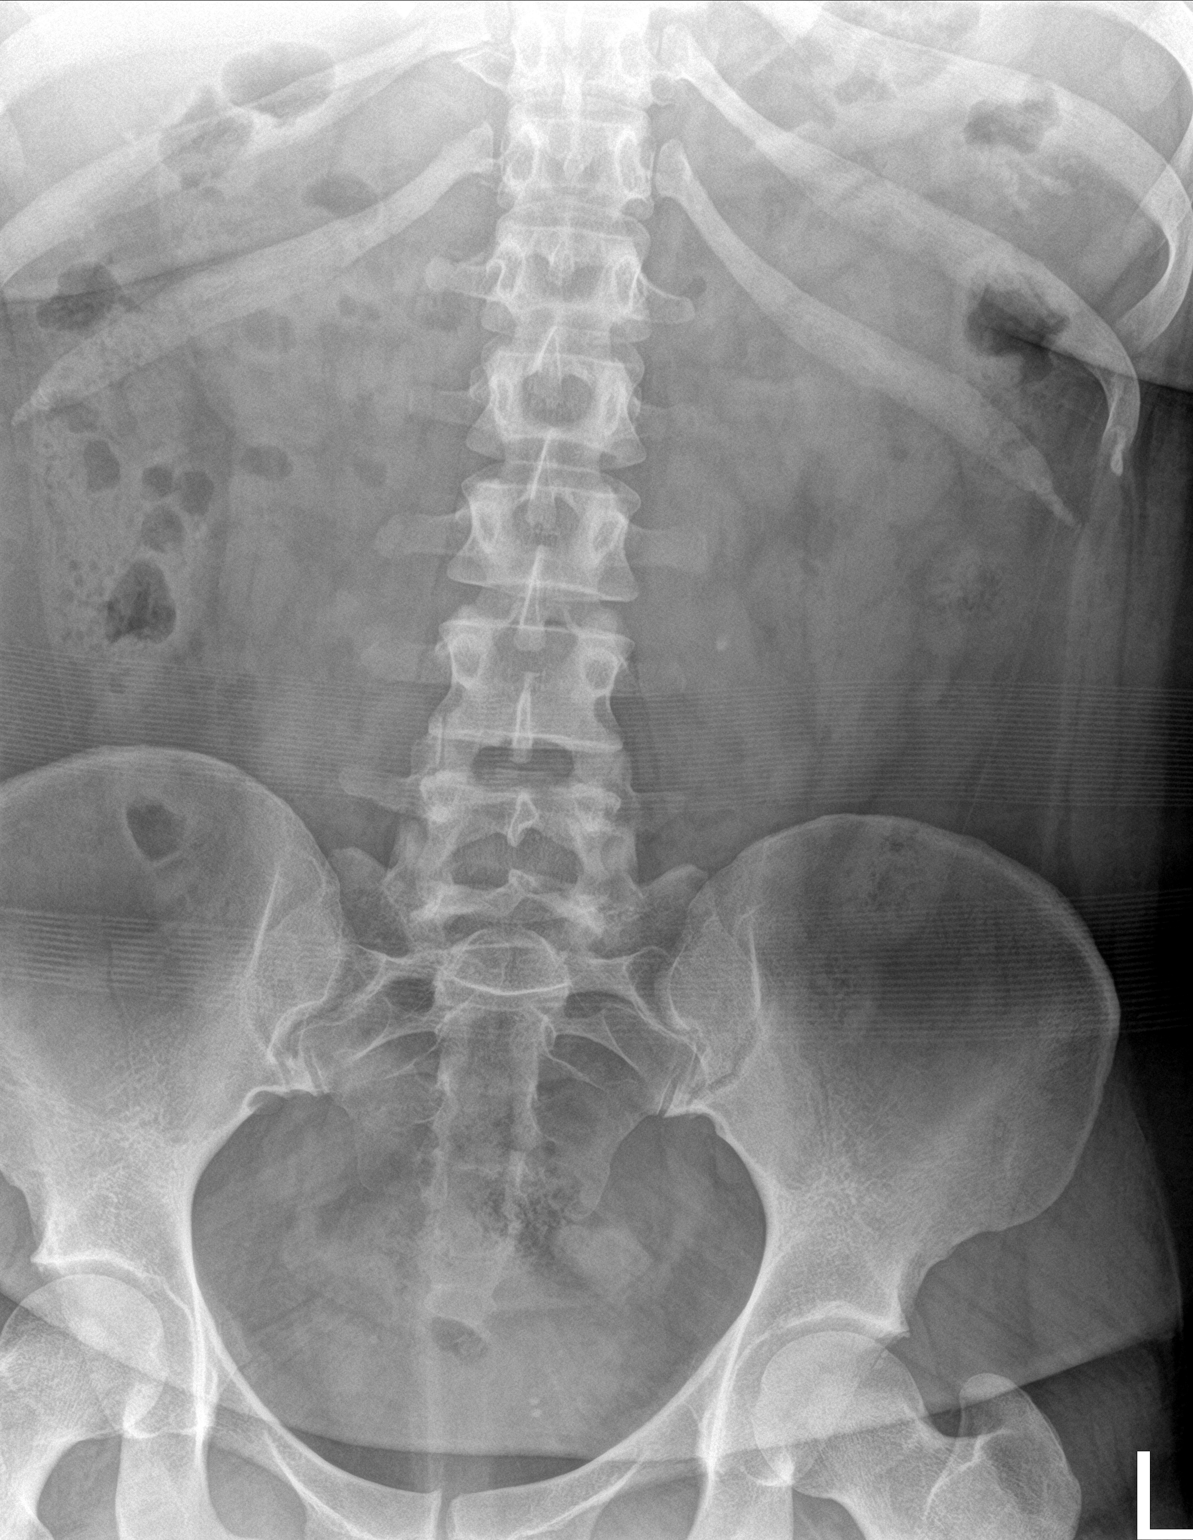

[1 of 1 positions shown; findings below may reference images not displayed]

FINDINGS: Scattered large and small bowel gas is noted. A small 4 mm
calcification is noted to the left of the midline at the L4 level
suspicious for mid ureteral stone. No other focal abnormality is
noted.
IMPRESSION: Findings suspicious for left mid ureteral stone. CT urography may be
helpful for further evaluation.

## 2022-08-26 ENCOUNTER — Encounter: Payer: Self-pay | Admitting: Adult Health

## 2022-08-26 ENCOUNTER — Ambulatory Visit (INDEPENDENT_AMBULATORY_CARE_PROVIDER_SITE_OTHER): Payer: 59 | Admitting: Adult Health

## 2022-08-26 VITALS — BP 111/77 | HR 84 | Ht 59.0 in | Wt 198.0 lb

## 2022-08-26 DIAGNOSIS — O3680X0 Pregnancy with inconclusive fetal viability, not applicable or unspecified: Secondary | ICD-10-CM | POA: Insufficient documentation

## 2022-08-26 DIAGNOSIS — R110 Nausea: Secondary | ICD-10-CM | POA: Insufficient documentation

## 2022-08-26 DIAGNOSIS — Z3A01 Less than 8 weeks gestation of pregnancy: Secondary | ICD-10-CM | POA: Insufficient documentation

## 2022-08-26 DIAGNOSIS — N941 Unspecified dyspareunia: Secondary | ICD-10-CM

## 2022-08-26 DIAGNOSIS — R102 Pelvic and perineal pain: Secondary | ICD-10-CM | POA: Diagnosis not present

## 2022-08-26 DIAGNOSIS — Z3201 Encounter for pregnancy test, result positive: Secondary | ICD-10-CM | POA: Insufficient documentation

## 2022-08-26 DIAGNOSIS — N926 Irregular menstruation, unspecified: Secondary | ICD-10-CM | POA: Insufficient documentation

## 2022-08-26 LAB — POCT URINE PREGNANCY: Preg Test, Ur: POSITIVE — AB

## 2022-08-26 MED ORDER — PRENATAL PLUS 27-1 MG PO TABS: 1.0000 | ORAL_TABLET | Freq: Every day | ORAL | 12 refills | Status: DC

## 2022-08-26 MED ORDER — PROMETHAZINE HCL 12.5 MG PO TABS: 12.5000 mg | ORAL_TABLET | Freq: Four times a day (QID) | ORAL | 1 refills | Status: DC | PRN

## 2022-08-26 NOTE — Progress Notes (Signed)
Subjective:     Patient ID: Tashelle Maler, female   DOB: 04/17/1995, 27 y.o.   MRN: [id number]  HPI Itzayana is a 27 year old white female, married, G1P0, in complaining of pelvic pain on and off, sharp for a month now, and sore heavy breasts, she has missed a period. UPT is +.     Component Value Date/Time   DIAGPAP  12/10/2019 1258    - Negative for intraepithelial lesion or malignancy (NILM)   DIAGPAP  08/19/2016 0000    NEGATIVE FOR INTRAEPITHELIAL LESIONS OR MALIGNANCY.   HPVHIGH Negative 12/10/2019 1258   ADEQPAP  12/10/2019 1258    Satisfactory for evaluation; transformation zone component PRESENT.   ADEQPAP  08/19/2016 0000    Satisfactory for evaluation  endocervical/transformation zone component PRESENT.    Review of Systems +pelvic pain on and off for 1 month +breast sore and heavy feedling +missed period +nausea +positional pain with sex Reviewed past medical,surgical, social and family history. Reviewed medications and allergies.     Objective:   Physical Exam BP 111/77 (BP Location: Left Arm, Patient Position: Sitting, Cuff Size: Normal)   Pulse 84   Ht 4\' 11"  (1.499 m)   Wt 198 lb (89.8 kg)   LMP 07/07/2022   BMI 39.99 kg/m  UPT is positive. About 7+1 week by LMP with EDD 04/13/23. Skin warm and dry. Neck: mid line trachea, normal thyroid, good ROM, no lymphadenopathy noted. Lungs: clear to ausculation bilaterally. Cardiovascular: regular rate and rhythm.    Fall risk is low  Upstream - 08/26/22 1420       Pregnancy Intention Screening   Does the patient want to become pregnant in the next year? Yes    Does the patient's partner want to become pregnant in the next year? Yes    Would the patient like to discuss contraceptive options today? No      Contraception Wrap Up   Current Method Pregnant/Seeking Pregnancy    End Method Pregnant/Seeking Pregnancy    Contraception Counseling Provided No             Assessment:     1. Pregnancy  examination or test, positive result - POCT urine pregnancy  2.Missed period +UPT  3. Pelvic pain On and off x 1 month, sharp  4. Dyspareunia in female Positional pain with sex  5. Nausea Will rx phenergan Try to eat small frequent meals and stay hydrated  Meds ordered this encounter  Medications   promethazine (PHENERGAN) 12.5 MG tablet    Sig: Take 1 tablet (12.5 mg total) by mouth every 6 (six) hours as needed.    Dispense:  30 tablet    Refill:  1    Order Specific Question:   Supervising Provider    Answer:   Despina Hidden, LUTHER H [[id number]]   prenatal vitamin w/FE, FA (PRENATAL 1 + 1) 27-1 MG TABS tablet    Sig: Take 1 tablet by mouth daily at 12 noon.    Dispense:  30 tablet    Refill:  12    Order Specific Question:   Supervising Provider    Answer:   Despina Hidden, LUTHER H [[id number]]     6. Less than [redacted] weeks gestation of pregnancy Will rx PNV  7. Encounter to determine fetal viability of pregnancy, single or unspecified fetus Return in 1 week for dating [place] - US OB Comp Less 14 Wks; Future     Plan:     Review OB handout

## 2022-09-05 ENCOUNTER — Other Ambulatory Visit: Payer: Self-pay | Admitting: Adult Health

## 2022-09-05 MED ORDER — NYSTATIN 100000 UNIT/GM EX POWD: 1.0000 | Freq: Three times a day (TID) | CUTANEOUS | 0 refills | Status: DC

## 2022-09-05 MED ORDER — FLUCONAZOLE 150 MG PO TABS: ORAL_TABLET | ORAL | 1 refills | Status: DC

## 2022-09-05 NOTE — Progress Notes (Signed)
Rx nystatin powders and diflucan

## 2022-09-06 ENCOUNTER — Ambulatory Visit (INDEPENDENT_AMBULATORY_CARE_PROVIDER_SITE_OTHER): Payer: 59 | Admitting: Family Medicine

## 2022-09-06 ENCOUNTER — Other Ambulatory Visit (INDEPENDENT_AMBULATORY_CARE_PROVIDER_SITE_OTHER): Payer: 59

## 2022-09-06 DIAGNOSIS — Z3A01 Less than 8 weeks gestation of pregnancy: Secondary | ICD-10-CM | POA: Diagnosis not present

## 2022-09-06 DIAGNOSIS — O3680X0 Pregnancy with inconclusive fetal viability, not applicable or unspecified: Secondary | ICD-10-CM

## 2022-09-06 NOTE — Progress Notes (Signed)
Patient informed that the ultrasound is considered a limited obstetric ultrasound and is not intended to be a complete ultrasound exam.  Patient also informed that the ultrasound is not being completed with the intent of assessing for fetal or placental anomalies or any pelvic abnormalities. Explained that the purpose of today's ultrasound is to assess for fetal heart rate.  Patient acknowledges the purpose of the exam and the limitations of the study.         

## 2022-09-06 NOTE — Progress Notes (Signed)
  Amenorrhea with positive UPT PROBLEM  VISIT ENCOUNTER NOTE  Subjective:   Courtney Spencer is a 27 y.o. [phone] female here for positive pregnancy test.  She has not had serial bhcg. [place] today showed gestational sac but no yolk or fetal pole.   Denies abnormal vaginal bleeding, discharge, pelvic pain, problems with intercourse or other gynecologic concerns.    Gynecologic History Patient's last menstrual period was 07/07/2022.  Health Maintenance Due  Topic Date Due   COVID-19 Vaccine (3 - 2023-24 season) 10/26/2021    The following portions of the patient's history were reviewed and updated as appropriate: allergies, current medications, past family history, past medical history, past social history, past surgical history and problem list.  Review of Systems Pertinent items are noted in HPI.   Objective:  BP 112/77   Pulse 77   Wt 196 lb (88.9 kg)   LMP 07/07/2022   BMI 39.59 kg/m  Gen: well appearing, NAD HEENT: no scleral icterus CV: RR Lung: Normal WOB Ext: warm well perfused   Assessment and Plan:  1. Encounter to determine fetal viability of pregnancy, single or unspecified fetus Concern for anembryonic/failed pregnancy. Does not meet criteria yet. Discussed with family serial bhcg and repeat US.  Provided support to patient and partner. - US OB Comp Less 14 Wks - US OB Limited; Future - Beta hCG quant (ref lab)    Please refer to After Visit Summary for other counseling recommendations.   No follow-ups on file.  Federico Flake, MD, MPH, ABFM Attending Physician Faculty Practice- [facility]

## 2022-09-09 ENCOUNTER — Other Ambulatory Visit: Payer: 59

## 2022-09-09 DIAGNOSIS — Z3490 Encounter for supervision of normal pregnancy, unspecified, unspecified trimester: Secondary | ICD-10-CM

## 2022-09-10 LAB — BETA HCG QUANT (REF LAB)
hCG Quant: 21917 m[IU]/mL
hCG Quant: 25863 m[IU]/mL

## 2022-09-11 ENCOUNTER — Telehealth: Payer: Self-pay | Admitting: *Deleted

## 2022-09-11 NOTE — Telephone Encounter (Signed)
Pt aware hcg is rising and needs [place] on 7/25 or later to confirm viability. Marcelino Duster will call pt tomorrow with [place] appt. Pt voiced understanding. JSY

## 2022-09-11 NOTE — Telephone Encounter (Signed)
-----   Message from Venora Maples sent at 09/11/2022 11:02 AM EDT ----- Patient's HCG is rising which is encouraging. Will need repeat US on 09/19/22 or later to confirm viability. Please call patient and let them know this result as well as schedule a follow up US.

## 2022-09-23 ENCOUNTER — Other Ambulatory Visit: Payer: Self-pay | Admitting: Obstetrics & Gynecology

## 2022-09-23 DIAGNOSIS — O3680X0 Pregnancy with inconclusive fetal viability, not applicable or unspecified: Secondary | ICD-10-CM

## 2022-09-23 DIAGNOSIS — N939 Abnormal uterine and vaginal bleeding, unspecified: Secondary | ICD-10-CM

## 2022-09-24 ENCOUNTER — Encounter: Payer: Self-pay | Admitting: Women's Health

## 2022-09-24 ENCOUNTER — Ambulatory Visit (INDEPENDENT_AMBULATORY_CARE_PROVIDER_SITE_OTHER): Payer: 59 | Admitting: Women's Health

## 2022-09-24 ENCOUNTER — Ambulatory Visit (INDEPENDENT_AMBULATORY_CARE_PROVIDER_SITE_OTHER): Payer: 59

## 2022-09-24 VITALS — BP 112/78 | HR 72 | Ht 59.0 in | Wt 199.0 lb

## 2022-09-24 DIAGNOSIS — O0281 Inappropriate change in quantitative human chorionic gonadotropin (hCG) in early pregnancy: Secondary | ICD-10-CM

## 2022-09-24 DIAGNOSIS — N939 Abnormal uterine and vaginal bleeding, unspecified: Secondary | ICD-10-CM

## 2022-09-24 DIAGNOSIS — O3680X0 Pregnancy with inconclusive fetal viability, not applicable or unspecified: Secondary | ICD-10-CM | POA: Diagnosis not present

## 2022-09-24 DIAGNOSIS — Z3A11 11 weeks gestation of pregnancy: Secondary | ICD-10-CM

## 2022-09-24 MED ORDER — MISOPROSTOL 200 MCG PO TABS: 800.0000 ug | ORAL_TABLET | Freq: Once | ORAL | 1 refills | Status: DC

## 2022-09-24 NOTE — Progress Notes (Signed)
GYN VISIT Patient name: Courtney Spencer MRN [id number]  Date of birth: May 03, 1995 Chief Complaint:   Discuss ultrasound results  History of Present Illness:   Courtney Spencer is a 27 y.o. G1P0 Caucasian female at 11.2wks by LMP being seen today for f/u after u/s.  U/S 7/12 no fetal pole or yolk sac, GS c/w [redacted]w[redacted]d. U/S today: no fetal pole or yolk sac, GS c/w [redacted]w[redacted]d, BHCG 21,917 (7/12),  95,284 (7/15). Some light brown spotting, and intermittent cramping.   Patient's last menstrual period was 07/07/2022. The current method of family planning is none.  Last pap 12/10/19. Results were: NILM w/ HRHPV negative     01/14/2022    3:32 PM 06/08/2021    1:13 PM 12/29/2020    2:57 PM 12/11/2020    8:42 AM 12/10/2019    1:04 PM  Depression screen PHQ 2/9  Decreased Interest 1 0 0 0 1  Down, Depressed, Hopeless 1 0 0 0 1  PHQ - 2 Score 2 0 0 0 2  Altered sleeping 1 1 0  2  Tired, decreased energy 1 2 0  2  Change in appetite 0 1 0  0  Feeling bad or failure about yourself  1 0 0  0  Trouble concentrating 0 0 0  0  Moving slowly or fidgety/restless 0 0 0  0  Suicidal thoughts 0 0 0  0  PHQ-9 Score 5 4 0  6  Difficult doing work/chores  Not difficult at all Not difficult at all          01/14/2022    3:32 PM 12/10/2019    1:04 PM  GAD 7 : Generalized Anxiety Score  Nervous, Anxious, on Edge 1 1  Control/stop worrying 1 1  Worry too much - different things 1 0  Trouble relaxing 1 0  Restless 0 0  Easily annoyed or irritable 0 0  Afraid - awful might happen 0 2  Total GAD 7 Score 4 4     Review of Systems:   Pertinent items are noted in HPI Denies fever/chills, dizziness, headaches, visual disturbances, fatigue, shortness of breath, chest pain, abdominal pain, vomiting, abnormal vaginal discharge/itching/odor/irritation, problems with periods, bowel movements, urination, or intercourse unless otherwise stated above.  Pertinent History Reviewed:  Reviewed past  medical,surgical, social, obstetrical and family history.  Reviewed problem list, medications and allergies. Physical Assessment:   Vitals:   09/24/22 1204  BP: 112/78  Pulse: 72  Weight: 199 lb (90.3 kg)  Height: 4\' 11"  (1.499 m)  Body mass index is 40.19 kg/m.       Physical Examination:   General appearance: alert, well appearing, and in no distress  Mental status: alert, oriented to person, place, and time  Skin: warm & dry   Cardiovascular: normal heart rate noted  Respiratory: normal respiratory effort, no distress  Abdomen: soft, non-tender   Pelvic: examination not indicated  Extremities: no edema   Chaperone: N/A    No results found for this or any previous visit (from the past 24 hour(s)).  Assessment & Plan:  1) Anembryonic pregnancy> discussed, offered expectant management vs cytotec, opts for cytotec. Rx sent, repeat in 48hr if needed. Reviewed warning s/s, reasons to seek care. F/U 1wk. Will check CBC, HCG, ABO/Rh  Meds:  Meds ordered this encounter  Medications   misoprostol (CYTOTEC) 200 MCG tablet    Sig: Place 4 tablets (800 mcg total) vaginally once for 1 dose. Repeat in  48 hours if needed    Dispense:  4 tablet    Refill:  1    Orders Placed This Encounter  Procedures   CBC   Beta hCG quant (ref lab)   ABO/Rh    Return in about 1 week (around 10/01/2022) for miscarriage f/u in person.  Cheral Marker CNM, [facility] 09/24/2022 12:25 PM

## 2022-09-24 NOTE — Progress Notes (Signed)
US TV 5+[redacted] wks gestational sac,no yolk sac or fetal pole visualized,? subseptate uterus,gestational sac in left horn,GS 10.1 mm

## 2022-09-24 NOTE — Patient Instructions (Signed)
FACTS YOU SHOULD KNOW  About Early Pregnancy Loss  WHAT IS AN EARLY PREGNANCY LOSS? Once the egg is fertilized with the sperm and begins to develop, it attaches to the lining of the uterus. This early pregnancy tissue may not develop into an embryo (the beginning stage of a baby). Sometimes an embryo does develop but does not continue to grow. These problems can be seen on ultrasound.  Many women experience the loss of a pregnancy during their lifetime.  As many as 10-30% of pregnancies end in pregnancy loss within the first trimester (or first 12-14 weeks).  MANAGEMNT OF EARLY PREGNANCY LOSS: There are 3 ways to care for an early pregnancy loss:   Surgery, (2) Medicine, (3) Waiting for you to pass the pregnancy on your own. The decision as to how to proceed after being diagnosed with and early pregnancy loss is an individual one.  The decision can be made only after appropriate counseling.  You need to weigh the pros and cons of the 3 choices. Then you can make the choice that works for you.  SURGERY (D&E) Procedure over in 1 day Requires being put to sleep Bleeding may be light Possible problems during surgery, including injury to womb(uterus) Care provider has more control Medicine (CYTOTEC) The complete procedure may take days to weeks No Surgery Bleeding may be heavy at times There may be drug side effects Patient has more control Waiting You may choose to wait, in which case your own body may complete the passing of the abnormal early pregnancy on its own in about 2-4 weeks Your bleeding may be heavy at times There is a small possibility that you may need surgery if the bleeding is too much or not all of the pregnancy has passed.  CYTOTEC MANAGEMENT Prostaglandins (cytotec) are the most widely used drug for this purpose. They cause the uterus to cramp and contract. You will place the medicine yourself inside your vagina in the privacy of your home. Empting of the uterus should  occur within 3 days but the process may continue for several weeks. The bleeding may seem heavy at times.  INSTRUCTIONS: Take all 4 tablets of cytotec ( total) at one time. This will cause a lot of cramping, you may have bleeding, and pass tissue, then the cramping and bleeding should get better. If you do not pass the tissue, then you can take 4 more tablets of cytotec ( total) 48 hours after your first dose.  You will come back to have your blood drawn to make sure the pregnancy hormones are dropping in 1 week. Please call us if you have any questions.   POSSIBLE SIDE EFFECTS FROM CYTOTEC Nausea  Vomiting Diarrhea Fever Chills  Hot Flashes Side effects  from the process of the early pregnancy loss include: Cramping  Bleeding Headaches  Dizziness RISKS: This is a low risk procedure. Less than 1 in 100 women has a complication. An incomplete passage of the early pregnancy may occur. Also, hemorrhage (heavy bleeding) could happen.  Rarely the pregnancy will not be passed completely. Excessively heavy bleeding may occur.  Your doctor may need to perform surgery to empty the uterus (D&E). Afterwards: Everybody will feel differently after the early pregnancy loss completion. You may have soreness or cramps for a day or two. You may have soreness or cramps for day or two.  You may have light bleeding for up to 2 weeks. You may be as active as you feel like being. If you  have any of the following problems you may call [facility] at [phone] or [facility] Admissions Unit at [phone] if it is after hours. If you have pain that does not get better with pain medication Bleeding that soaks through 2 thick full-sized sanitary pads in an hour Cramps that last longer than 2 days Foul smelling discharge Fever above 100.4 degrees F Even if you do not have any of these symptoms, you should have a follow-up exam to make sure you are healing properly. Your next normal period will usually start  again in 4-6 week after the loss. You can get pregnant soon after the loss, so use birth control right away. Finally: Make sure all your questions are answered before during and after any procedure. Follow up with medical care and family planning methods.     

## 2022-09-28 ENCOUNTER — Inpatient Hospital Stay (HOSPITAL_COMMUNITY)
Admission: AD | Admit: 2022-09-28 | Discharge: 2022-09-28 | Disposition: A | Discharge status: Home / Self Care | Payer: 59 | Source: Home / Self Care | Attending: Obstetrics & Gynecology | Admitting: Obstetrics & Gynecology

## 2022-09-28 ENCOUNTER — Inpatient Hospital Stay (HOSPITAL_COMMUNITY): Payer: 59

## 2022-09-28 ENCOUNTER — Encounter (HOSPITAL_COMMUNITY): Payer: Self-pay | Admitting: Obstetrics & Gynecology

## 2022-09-28 DIAGNOSIS — O039 Complete or unspecified spontaneous abortion without complication: Secondary | ICD-10-CM | POA: Diagnosis not present

## 2022-09-28 DIAGNOSIS — R519 Headache, unspecified: Secondary | ICD-10-CM | POA: Diagnosis present

## 2022-09-28 DIAGNOSIS — R109 Unspecified abdominal pain: Secondary | ICD-10-CM | POA: Diagnosis not present

## 2022-09-28 DIAGNOSIS — O209 Hemorrhage in early pregnancy, unspecified: Secondary | ICD-10-CM | POA: Diagnosis present

## 2022-09-28 DIAGNOSIS — O0281 Inappropriate change in quantitative human chorionic gonadotropin (hCG) in early pregnancy: Secondary | ICD-10-CM | POA: Diagnosis present

## 2022-09-28 DIAGNOSIS — Z3A11 11 weeks gestation of pregnancy: Secondary | ICD-10-CM

## 2022-09-28 LAB — URINALYSIS, ROUTINE W REFLEX MICROSCOPIC
Bilirubin Urine: NEGATIVE
Glucose, UA: NEGATIVE mg/dL
Ketones, ur: NEGATIVE mg/dL
Nitrite: NEGATIVE
Protein, ur: 30 mg/dL — AB
RBC / HPF: >50 RBC/hpf (ref 0–5)
Specific Gravity, Urine: 1.023 (ref 1.005–1.030)
pH: 6 (ref 5.0–8.0)

## 2022-09-28 MED ORDER — OXYCODONE-ACETAMINOPHEN 5-325 MG PO TABS
1.0000 | ORAL_TABLET | Freq: Once | ORAL | Status: AC
  Administered 2022-09-28: 1 via ORAL
  Filled 2022-09-28: qty 1

## 2022-09-28 MED ORDER — KETOROLAC TROMETHAMINE 60 MG/2ML IM SOLN
60.0000 mg | Freq: Once | INTRAMUSCULAR | Status: AC
  Administered 2022-09-28: 60 mg via INTRAMUSCULAR
  Filled 2022-09-28: qty 2

## 2022-09-28 MED ORDER — OXYCODONE-ACETAMINOPHEN 5-325 MG PO TABS: 1.0000 | ORAL_TABLET | ORAL | 0 refills | Status: DC | PRN

## 2022-09-28 NOTE — MAU Provider Note (Signed)
History     CSN: [id number]  Arrival date and time: 09/28/22 1144   Event Date/Time   First Provider Initiated Contact with Patient 09/28/22 1432      Chief Complaint  Patient presents with   Vaginal Bleeding   Abdominal Pain   Back Pain   HPI  Courtney Spencer is a 27 y.o. female G1P0 @ [redacted]w[redacted]d here in MAU with vaginal bleeding. She was diagnosed with an SAB in the office on 7/30. She was prescribed cytotec and Never picked up the cytotec because there was an issue with the pharmacy.   She is here with concerns about pain. She reports abdominal cramping 5/10. She took ibuprofen last night at 1700 which has not helped her pain at all.  She was not given anything stronger for the pain. She reports passing Small blood clots, but not sure if she passed the gestational sac. Last [place] measured a 5 weeks GS.   OB History     Gravida  1   Para      Term      Preterm      AB      Living         SAB      IAB      Ectopic      Multiple      Live Births              Past Medical History:  Diagnosis Date   Allergy    Anxiety    Cat scratch fever    Depression    Endometriosis    GERD (gastroesophageal reflux disease)    Heart murmur of newborn    Helicobacter pylori ab+    H.pylori serology positive in Jan 2015, treated with Amoxicillin and Flagyl at that time. Returned to clinic Feb 2015 and had rechecked, with a different provider treating with Amoxicillin and Biaxin. H.pylori stool antigen ordered April 2015 and negative.   Hematuria 08/09/2013   History of kidney stones    Kidney stones 10/26/2013   Migraines    Other and unspecified ovarian cyst 08/09/2013   Ovarian cyst    UTI (lower urinary tract infection)     Past Surgical History:  Procedure Laterality Date   BIOPSY  04/21/2020   Procedure: BIOPSY;  Surgeon: Lanelle Bal, DO;  Location: [facility];  Service: Endoscopy;;   CHOLECYSTECTOMY N/A 05/12/2015   Procedure: LAPAROSCOPIC  CHOLECYSTECTOMY;  Surgeon: Franky Macho, MD;  Location: [facility] ORS;  Service: General;  Laterality: N/A;   ESOPHAGOGASTRODUODENOSCOPY N/A 08/13/2013   Dr. Darrick Penna: nodular gastritis, negative H.pylori   ESOPHAGOGASTRODUODENOSCOPY (EGD) WITH PROPOFOL N/A 04/21/2020   Surgeon: Lanelle Bal, DO; Small hiatal hernia, gastritis biopsied, normal examined duodenum biopsied, normal esophagus biopsied.  Pathology revealed benign duodenal biopsy, mild chronic gastritis negative for H. pylori, benign esophageal biopsy.   WISDOM TOOTH EXTRACTION      Family History  Problem Relation Age of Onset   Gallbladder disease Mother        gallbladder was removed   Migraines Mother    Migraines Sister    Hypertension Sister    Seizures Sister    Scoliosis Sister    ADD / ADHD Brother    ADD / ADHD Brother    Hypertension Maternal Grandmother    Cancer Maternal Grandfather        lung   Cancer Paternal Grandfather        lung   Colon  cancer Other        unknown   Inflammatory bowel disease Neg Hx     Social History   Tobacco Use   Smoking status: Never   Smokeless tobacco: Never   Tobacco comments:    Never really smoked  Vaping Use   Vaping status: Never Used  Substance Use Topics   Alcohol use: No    Alcohol/week: 0.0 standard drinks of alcohol   Drug use: No    Allergies: No Known Allergies  Medications Prior to Admission  Medication Sig Dispense Refill Last Dose   ibuprofen (ADVIL) 600 MG tablet Take 600 mg by mouth every 6 (six) hours as needed.   09/27/2022   omeprazole (PRILOSEC) 40 MG capsule Take 1 capsule (40 mg total) by mouth in the morning and at bedtime. 180 capsule 1 Past Week   Loratadine (CLARITIN PO) Take by mouth.      misoprostol (CYTOTEC) 200 MCG tablet Place 4 tablets (800 mcg total) vaginally once for 1 dose. Repeat in 48 hours if needed 4 tablet 1    prenatal vitamin w/FE, FA (PRENATAL 1 + 1) 27-1 MG TABS tablet Take 1 tablet by mouth daily at 12 noon. 30 tablet 12     promethazine (PHENERGAN) 12.5 MG tablet Take 1 tablet (12.5 mg total) by mouth every 6 (six) hours as needed. 30 tablet 1    Results for orders placed or performed during the hospital encounter of 09/28/22 (from the past 48 hour(s))  Urinalysis, Routine w reflex microscopic -Urine, Clean Catch     Status: Abnormal   Collection Time: 09/28/22  1:07 PM  Result Value Ref Range   Color, Urine YELLOW YELLOW   APPearance HAZY (A) CLEAR   Specific Gravity, Urine 1.023 1.005 - 1.030   pH 6.0 5.0 - 8.0   Glucose, UA NEGATIVE NEGATIVE mg/dL   Hgb urine dipstick LARGE (A) NEGATIVE   Bilirubin Urine NEGATIVE NEGATIVE   Ketones, ur NEGATIVE NEGATIVE mg/dL   Protein, ur 30 (A) NEGATIVE mg/dL   Nitrite NEGATIVE NEGATIVE   Leukocytes,Ua TRACE (A) NEGATIVE   RBC / HPF >50 0 - 5 RBC/hpf   WBC, UA 0-5 0 - 5 WBC/hpf   Bacteria, UA RARE (A) NONE SEEN   Squamous Epithelial / HPF 0-5 0 - 5 /HPF   Mucus PRESENT     Comment: Performed at [facility], [address], [place], [place] [address]    [place] Transvaginal  Result Date: 09/28/2022 CLINICAL DATA:  Vaginal bleeding.  Ongoing miscarriage. EXAM: TRANSVAGINAL OB ULTRASOUND TECHNIQUE: Transvaginal ultrasound was performed for complete evaluation of the gestation as well as the maternal uterus, adnexal regions, and pelvic cul-de-sac. COMPARISON:  Ob ultrasound dated September 24, 2022. FINDINGS: Intrauterine gestational sac: None. Maternal uterus/adnexae: Small amount of fluid and debris in the endometrial canal. Normal right ovary. Nonvisualized left ovary. No free fluid in the pelvis. IMPRESSION: 1. No intrauterine gestational sac. Small amount of fluid and debris within the endometrial canal. Findings consistent with ongoing miscarriage. Electronically Signed   By: Obie Dredge M.D.   On: 09/28/2022 15:28     Review of Systems  Constitutional:  Negative for fever.  Gastrointestinal:  Positive for abdominal pain.  Genitourinary:  Positive for  vaginal bleeding.   Physical Exam   Blood pressure 104/69, pulse 88, temperature 98.5 F (36.9 C), temperature source Oral, resp. rate 17, height 4\' 11"  (1.499 m), weight 89.9 kg, last menstrual period 07/07/2022, SpO2 99%.  Physical Exam Constitutional:      General: She is not in acute distress.    Appearance: She is well-developed. She is not ill-appearing, toxic-appearing or diaphoretic.  Abdominal:     Tenderness: There is no abdominal tenderness.  Genitourinary:    Comments: Cervix closed, thick, posterior Small amount of dark red blood noted on exam glove.   Neurological:     Mental Status: She is alert.    MAU Course  Procedures  MDM  O positive blood type [place] no longer shows gestational sac Toradol 60 mg IM and percocet 1 tablet given Follow up scheduled with FT on Monday.   Assessment and Plan   A:  1. Miscarriage   2. Abdominal pain, unspecified abdominal location     P:  Dc home Rx: percocet #6, no refill Return to MAU if symptoms worsen Keep your f/u appointment on Monday Bleeding precautions Ok to continue ibuprofen  Neka Bise, Harolyn Rutherford, NP 09/28/2022 4:20 PM

## 2022-09-28 NOTE — MAU Note (Signed)
...  Courtney Spencer is a 27 y.o. at [redacted]w[redacted]d here in MAU reporting: She reports she was dx with a chemical pregnancy and that she is miscarrying. She reports she was in the office this past Tuesday and they sent her a rx for Cytotec but her pharmacy did not have it at the time. She reports she picked the medication up this morning but reports she has not taken it as her bleeding and pain began on its own. Reports nausea and a HA. Reports her vaginal bleeding is small to moderate accompanied by dime sized blood clots as well as string like blood clots.  July 25th is when she began experiencing vaginal spotting and reports this past Wednesday is when her bleeding and pelvic pain increased. She reports this morning her back began hurting as well. She reports she would like assistance in pain relief.  Last took 600 mg of Ibuprofen yesterday at 1706.  Pain scores:  5/10 pelvis 5/10 back  Lab orders placed from triage: none

## 2022-09-29 LAB — CULTURE, OB URINE

## 2022-09-30 ENCOUNTER — Ambulatory Visit (INDEPENDENT_AMBULATORY_CARE_PROVIDER_SITE_OTHER): Payer: 59 | Admitting: Obstetrics and Gynecology

## 2022-09-30 ENCOUNTER — Encounter: Payer: Self-pay | Admitting: Obstetrics and Gynecology

## 2022-09-30 VITALS — BP 103/77 | HR 84 | Ht 59.0 in | Wt 200.0 lb

## 2022-09-30 DIAGNOSIS — O039 Complete or unspecified spontaneous abortion without complication: Secondary | ICD-10-CM

## 2022-09-30 DIAGNOSIS — Z3A11 11 weeks gestation of pregnancy: Secondary | ICD-10-CM

## 2022-09-30 LAB — POCT URINALYSIS DIPSTICK
Glucose, UA: NEGATIVE
Ketones, UA: NEGATIVE
Leukocytes, UA: NEGATIVE
Nitrite, UA: NEGATIVE
Protein, UA: POSITIVE — AB

## 2022-09-30 NOTE — Progress Notes (Signed)
   GYNECOLOGY PROGRESS NOTE  History:  27 y.o. G1P0 presents to [facility] today for follow up after miscarriage She reports scant bleeding, pain is much better.  She denies h/a, dizziness, shortness of breath, n/v, or fever/chills.    The following portions of the patient's history were reviewed and updated as appropriate: allergies, current medications, past family history, past medical history, past social history, past surgical history and problem list. Last pap smear on 12/10/19 was normal, neg HRHPV.  Health Maintenance Due  Topic Date Due   COVID-19 Vaccine (3 - 2023-24 season) 10/26/2021   INFLUENZA VACCINE  09/26/2022     Review of Systems:  Pertinent items are noted in HPI.   Objective:  Physical Exam Blood pressure 103/77, pulse 84, height 4\' 11"  (1.499 m), weight 200 lb (90.7 kg), last menstrual period 07/07/2022. VS reviewed, nursing note reviewed,  Constitutional: well developed, well nourished, no distress HEENT: normocephalic CV: normal rate Pulm/chest wall: normal effort Breast Exam: deferred Abdomen: soft Neuro: alert and oriented x 3 Skin: warm, dry Psych: affect normal Pelvic exam: deferred  Assessment & Plan:  1. Miscarriage Expressed condolences Last hcg 9000, discussed trending down, she and partner have had time to process and she reports doing well overall. expresses she and partner would like to get pregnant, discussed time frame, trend numbers down, future fertility, all questions answered.   - Beta hCG quant (ref lab) - POCT Urinalysis Dipstick   Albertine Grates, FNP

## 2022-10-07 ENCOUNTER — Other Ambulatory Visit: Payer: Medicaid Other

## 2022-10-07 DIAGNOSIS — O039 Complete or unspecified spontaneous abortion without complication: Secondary | ICD-10-CM

## 2022-10-08 ENCOUNTER — Other Ambulatory Visit: Payer: Self-pay | Admitting: Adult Health

## 2022-10-08 DIAGNOSIS — O039 Complete or unspecified spontaneous abortion without complication: Secondary | ICD-10-CM

## 2022-10-16 ENCOUNTER — Other Ambulatory Visit: Payer: Self-pay | Admitting: Adult Health

## 2022-10-16 DIAGNOSIS — O039 Complete or unspecified spontaneous abortion without complication: Secondary | ICD-10-CM

## 2022-10-26 LAB — BETA HCG QUANT (REF LAB): hCG Quant: 1 m[IU]/mL

## 2022-12-23 ENCOUNTER — Encounter: Payer: Self-pay | Admitting: Gastroenterology

## 2023-01-22 ENCOUNTER — Encounter: Payer: Self-pay | Admitting: Women's Health

## 2023-01-31 ENCOUNTER — Other Ambulatory Visit: Payer: Self-pay | Admitting: Gastroenterology

## 2023-01-31 DIAGNOSIS — K219 Gastro-esophageal reflux disease without esophagitis: Secondary | ICD-10-CM

## 2023-02-09 NOTE — Progress Notes (Unsigned)
GI Office Note    Referring Provider: No ref. provider found Primary Care Physician:  Patient, No Pcp Per Primary Gastroenterologist: Hennie Duos. Marletta Lor, DO  Date:  02/10/2023  ID:  Courtney Spencer, DOB 07/30/1995, MRN [id number]   Chief Complaint   Chief Complaint  Patient presents with   Gastroesophageal Reflux    Pt arrives for medication refill of Omeparazole.    History of Present Illness  Courtney Spencer is a 27 y.o. female with a history of GERD, H.Pylori s/p eradication, abdominal pain, chronic loose stools, and endometriosis presenting today for refills of reflux medication.   EGD 04/21/2020: -Small hiatal hernia - Gastritis s/p biopsy (chronic gastritis negative for H. Pylori) - Normal duodenum/P biopsy (benign) - Normal esophagus s/p biopsy (benign) - Recommended PPI twice daily for 8 weeks and then decrease to once daily.   OV 12/27/2020.  Patient reported everything was giving her heartburn and had done well on PPI twice daily.  Not experiencing any dysphagia.  Was having occasional nausea without vomiting and improved epigastric pain but still tender on palpation.  Ongoing chronic abdominal pain that continued after cholecystectomy.  Intermittent cramping that occurs daily.  Somewhat improved with PPI twice daily seem to be worse with her.  And some relief with bowel movements.  Noted chronic loose stools 3-6 bowel movements per day usually postprandial, which was occurring prior to cholecystectomy.  Pain is usually 5-6/10.  Denied BRBPR or melena.  Noted NSAID use about once per month.  Denied any family history of IBD.  Advised to increase omeprazole to 40 mg twice daily.  GERD lifestyle/diet reinforced.  ESR and CRP checked and was given dicyclomine 10 mg up to 3 times daily and at bedtime and to follow a lactose-free diet.   ESR and CRP never completed.  Last visit virtually 01/01/22. Taking omeprazole twice daily but recently had run out. Trying to  eliminate certain foods and triggers. Would like to restart medications BID. Has been nauseas but no vomiting. Actively trying to get pregnancy. Has looser stools. Waits to eat till later in the day due to this. Omeprazole 40 BID written. GERD diet and avoidance of lactose recommended. Low  FODMAP diet. Follow up 1 year.    Today:  Tries to take her omeprazole twice daily but sometimes forgets. Usually take sit at least once daily. When she does not take it she feels burning in her throat. Tries to avoid triggers but still looking to identify those. She knows cheese and chocolate is bothersome to her as well. Denies nausea vomiting or dysphagia. Has a good appetite. Still having some issues with looser stools. Thinks her anxiety contributes to it as well. Does not have her gallbladder - removed a while back. If she eats she can have 40-5 BM at a time. Not usually liquid. Sometimes mushy or semi formed and soft. She thought she was lactose intolerant - sometimes it goes through her and sometimes doesn't.   Still working on getting pregnant.   Wt Readings from Last 3 Encounters:  02/10/23 199 lb 3.2 oz (90.4 kg)  09/30/22 200 lb (90.7 kg)  09/28/22 198 lb 4.8 oz (89.9 kg)    Current Outpatient Medications  Medication Sig Dispense Refill   Loratadine (CLARITIN PO) Take by mouth.     omeprazole (PRILOSEC) 40 MG capsule Take 1 capsule (40 mg total) by mouth in the morning and at bedtime. 180 capsule 1   prenatal vitamin w/FE, FA (PRENATAL  1 + 1) 27-1 MG TABS tablet Take 1 tablet by mouth daily at 12 noon. 30 tablet 12   Vitamin D, Ergocalciferol, (DRISDOL) 1.25 MG (50000 UNIT) CAPS capsule Take 50,000 Units by mouth daily.     No current facility-administered medications for this visit.    Past Medical History:  Diagnosis Date   Allergy    Anxiety    Cat scratch fever    Depression    Endometriosis    GERD (gastroesophageal reflux disease)    Heart murmur of newborn    Helicobacter  pylori ab+    H.pylori serology positive in Jan 2015, treated with Amoxicillin and Flagyl at that time. Returned to clinic Feb 2015 and had rechecked, with a different provider treating with Amoxicillin and Biaxin. H.pylori stool antigen ordered April 2015 and negative.   Hematuria 08/09/2013   History of kidney stones    Kidney stones 10/26/2013   Migraines    Other and unspecified ovarian cyst 08/09/2013   Ovarian cyst    UTI (lower urinary tract infection)     Past Surgical History:  Procedure Laterality Date   BIOPSY  04/21/2020   Procedure: BIOPSY;  Surgeon: Lanelle Bal, DO;  [facility];  Service: Endoscopy;;   CHOLECYSTECTOMY N/A 05/12/2015   Procedure: LAPAROSCOPIC CHOLECYSTECTOMY;  Surgeon: Franky Macho, MD;  Location: [facility];  Service: General;  Laterality: N/A;   ESOPHAGOGASTRODUODENOSCOPY N/A 08/13/2013   Dr. Darrick Penna: nodular gastritis, negative H.pylori   ESOPHAGOGASTRODUODENOSCOPY (EGD) WITH PROPOFOL N/A 04/21/2020   Surgeon: Lanelle Bal, DO; Small hiatal hernia, gastritis biopsied, normal examined duodenum biopsied, normal esophagus biopsied.  Pathology revealed benign duodenal biopsy, mild chronic gastritis negative for H. pylori, benign esophageal biopsy.   WISDOM TOOTH EXTRACTION      Family History  Problem Relation Age of Onset   Gallbladder disease Mother        gallbladder was removed   Migraines Mother    Migraines Sister    Hypertension Sister    Seizures Sister    Scoliosis Sister    ADD / ADHD Brother    ADD / ADHD Brother    Hypertension Maternal Grandmother    Cancer Maternal Grandfather        lung   Cancer Paternal Grandfather        lung   Colon cancer Other        unknown   Inflammatory bowel disease Neg Hx     Allergies as of 02/10/2023 - Review Complete 02/10/2023  Allergen Reaction Noted   Latex Other (See Comments) 09/30/2022    Social History   Socioeconomic History   Marital status: Married    Spouse name:  Not on file   Number of children: Not on file   Years of education: Not on file   Highest education level: Not on file  Occupational History   Occupation: student    Comment: Early Childhood Development, 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   Tobacco Use   Smoking status: Never   Smokeless tobacco: Never   Tobacco comments:    Never really smoked  Vaping Use   Vaping status: Never Used  Substance and Sexual Activity   Alcohol use: No    Alcohol/week: 0.0 standard drinks of alcohol   Drug use: No   Sexual activity: Not Currently    Partners: Male    Birth control/protection: None  Other Topics Concern   Not on file  Social History Narrative   Not on file   Social Drivers of Health   Financial Resource Strain: Low Risk  (01/14/2022)   Overall Financial Resource Strain (CARDIA)    Difficulty of Paying Living Expenses: Not hard at all  Food Insecurity: No Food Insecurity (01/14/2022)   Hunger Vital Sign    Worried About Running Out of Food in the Last Year: Never true    Ran Out of Food in the Last Year: Never true  Transportation Needs: No Transportation Needs (01/14/2022)   PRAPARE - Administrator, Civil Service (Medical): No    Lack of Transportation (Non-Medical): No  Physical Activity: Insufficiently Active (01/14/2022)   Exercise Vital Sign    Days of Exercise per Week: 1 day    Minutes of Exercise per Session: 20 min  Stress: Stress Concern Present (01/14/2022)   [employer] of Occupational Health - Occupational Stress Questionnaire    Feeling of Stress : Rather much  Social Connections: Moderately Isolated (01/14/2022)   Social Connection and Isolation Panel [NHANES]    Frequency of Communication with Friends and Family: More than three times a week    Frequency of Social Gatherings with Friends and Family: Three times a week    Attends Religious Services: Never    Active Member of Clubs or Organizations: No    Attends Occupational hygienist Meetings: Never    Marital Status: Married     Review of Systems   Gen: Denies fever, chills, anorexia. Denies fatigue, weakness, weight loss.  CV: Denies chest pain, palpitations, syncope, peripheral edema, and claudication. Resp: Denies dyspnea at rest, cough, wheezing, coughing up blood, and pleurisy. GI: See HPI Derm: Denies rash, itching, dry skin Psych: Denies depression, anxiety, memory loss, confusion. No homicidal or suicidal ideation.  Heme: Denies bruising, bleeding, and enlarged lymph nodes.  Physical Exam   BP 104/70   Pulse 80   Temp 98.1 F (36.7 C)   Ht 4\' 11"  (1.499 m)   Wt 199 lb 3.2 oz (90.4 kg)   LMP 07/07/2022   BMI 40.23 kg/m   General:   Alert and oriented. No distress noted. Pleasant and cooperative.  Head:  Normocephalic and atraumatic. Eyes:  Conjuctiva clear without scleral icterus. Mouth:  Oral mucosa pink and moist. Good dentition. No lesions.  Abdomen:  +BS, soft, non-tender and non-distended. No rebound or guarding. No HSM or masses noted. Rectal: deferred Msk:  Symmetrical without gross deformities. Normal posture. Neurologic:  Alert and  oriented x4 Psych:  Alert and cooperative. Normal mood and affect.  Assessment  Brentlee Judie Petit Anae Steenson is a 27 y.o. female with a history of GERD, H.Pylori s/p eradication, abdominal pain, chronic loose stools, and endometriosis presenting today for follow-up of reflux and refill on medication.  GERD: Usually fairly well-controlled with omeprazole 40 mg 1-2 times daily.  Sometimes has some breakthrough with certain foods and other times it does not bother her.  Sometimes forgets to take it twice a day and therefore only takes it once daily.  Denies any nausea, vomiting, or dysphagia.  Will refill medication to use omeprazole 1-2 times a day.  Continue to work toward finding food triggers and avoiding these.  Loose stools: Continues to have some loose stools at times as well as associated  urgency after meals.  This could be secondary to bile acid component given her prior cholecystectomy.  Will start with trialing fiber supplementation and continuing to follow a low-fat diet.   PLAN  Omeprazole 40 mg one-two times daily.  GERD diet Fiber supplementation - metamucil Low-fat diet Follow-up 6 months    Brooke Bonito, MSN, FNP-BC, AGACNP-BC [facility]

## 2023-02-10 ENCOUNTER — Ambulatory Visit (INDEPENDENT_AMBULATORY_CARE_PROVIDER_SITE_OTHER): Payer: No Typology Code available for payment source | Admitting: Gastroenterology

## 2023-02-10 ENCOUNTER — Encounter: Payer: Self-pay | Admitting: Gastroenterology

## 2023-02-10 VITALS — BP 104/70 | HR 80 | Temp 98.1°F | Ht 59.0 in | Wt 199.2 lb

## 2023-02-10 DIAGNOSIS — K219 Gastro-esophageal reflux disease without esophagitis: Secondary | ICD-10-CM

## 2023-02-10 DIAGNOSIS — R195 Other fecal abnormalities: Secondary | ICD-10-CM

## 2023-02-10 MED ORDER — OMEPRAZOLE 40 MG PO CPDR
40.0000 mg | DELAYED_RELEASE_CAPSULE | Freq: Two times a day (BID) | ORAL | 1 refills | Status: DC
Start: 2023-02-10 — End: 2023-09-02

## 2023-02-10 NOTE — Patient Instructions (Addendum)
I refilled your omeprazole to the pharmacy for you.   Below are some good fiber options for you at [facility].  These are generic to Metamucil.  1 is the powder that you mix in water and the other 1 is a fiber capsule.  Continue to follow a low-fat diet.  This can help you with the looser stools.    We will plan to follow-up in 6 months, sooner if needed.  I hope you have a Alt[place] Christmas and a happy new year!  It was a pleasure to see you today. I want to create trusting relationships with patients. If you receive a survey regarding your visit,  I greatly appreciate you taking time to fill this out on paper or through your MyChart. I value your feedback.  Brooke Bonito, MSN, FNP-BC, AGACNP-BC [facility]

## 2023-02-21 ENCOUNTER — Encounter: Payer: Self-pay | Admitting: *Deleted

## 2023-02-25 ENCOUNTER — Other Ambulatory Visit: Payer: Self-pay | Admitting: Adult Health

## 2023-02-25 ENCOUNTER — Other Ambulatory Visit: Payer: Self-pay | Admitting: Obstetrics & Gynecology

## 2023-02-25 ENCOUNTER — Ambulatory Visit: Payer: No Typology Code available for payment source | Admitting: *Deleted

## 2023-02-25 VITALS — BP 108/74 | HR 77 | Ht 59.0 in | Wt 192.4 lb

## 2023-02-25 DIAGNOSIS — Z3201 Encounter for pregnancy test, result positive: Secondary | ICD-10-CM

## 2023-02-25 DIAGNOSIS — O3680X0 Pregnancy with inconclusive fetal viability, not applicable or unspecified: Secondary | ICD-10-CM

## 2023-02-25 DIAGNOSIS — N926 Irregular menstruation, unspecified: Secondary | ICD-10-CM

## 2023-02-25 DIAGNOSIS — O99210 Obesity complicating pregnancy, unspecified trimester: Secondary | ICD-10-CM

## 2023-02-25 LAB — POCT URINE PREGNANCY: Preg Test, Ur: POSITIVE — AB

## 2023-02-25 MED ORDER — ONDANSETRON HCL 4 MG PO TABS: 4.0000 mg | ORAL_TABLET | Freq: Three times a day (TID) | ORAL | 1 refills | Status: DC | PRN

## 2023-02-25 NOTE — Progress Notes (Signed)
   NURSE VISIT- PREGNANCY CONFIRMATION   SUBJECTIVE:  Courtney Spencer is a 27 y.o. G2P0010 female at [redacted]w[redacted]d by certain LMP of Patient's last menstrual period was 01/08/2023 (approximate). Here for pregnancy confirmation.  Home pregnancy test: positive x 4   She reports nausea.  She is taking prenatal vitamins.    OBJECTIVE:  BP 108/74 (BP Location: Right Arm, Patient Position: Sitting, Cuff Size: Normal)   Pulse 77   Ht 4' 11 (1.499 m)   Wt 192 lb 6.4 oz (87.3 kg)   LMP 01/08/2023 (Approximate)   Breastfeeding No   BMI 38.86 kg/m   Appears well, in no apparent distress  Results for orders placed or performed in visit on 02/25/23 (from the past 24 hours)  POCT urine pregnancy   Collection Time: 02/25/23  9:45 AM  Result Value Ref Range   Preg Test, Ur Positive (A) Negative    ASSESSMENT: Positive pregnancy test, [redacted]w[redacted]d by LMP    PLAN: Schedule for dating ultrasound in 1 week Prenatal vitamins: continue   Nausea medicines: requested-note routed to Delon Lewis, NP to send prescription   OB packet given: Yes  Rutherford Rover  02/25/2023 9:45 AM

## 2023-02-25 NOTE — Progress Notes (Signed)
-  Rx. zofran 

## 2023-03-04 ENCOUNTER — Ambulatory Visit (INDEPENDENT_AMBULATORY_CARE_PROVIDER_SITE_OTHER): Payer: Managed Care, Other (non HMO) | Admitting: Radiology

## 2023-03-04 ENCOUNTER — Encounter: Payer: Self-pay | Admitting: Radiology

## 2023-03-04 DIAGNOSIS — O99210 Obesity complicating pregnancy, unspecified trimester: Secondary | ICD-10-CM

## 2023-03-04 DIAGNOSIS — O99211 Obesity complicating pregnancy, first trimester: Secondary | ICD-10-CM

## 2023-03-04 DIAGNOSIS — O3680X0 Pregnancy with inconclusive fetal viability, not applicable or unspecified: Secondary | ICD-10-CM

## 2023-03-04 DIAGNOSIS — Z3A01 Less than 8 weeks gestation of pregnancy: Secondary | ICD-10-CM | POA: Diagnosis not present

## 2023-03-04 NOTE — Progress Notes (Signed)
 LMP 01-08-23  =  7+6 weeks   check dates  Anteverted uterus with single viable early IUP,  CRL = 14.9 mm = 7+6 weeks,  FHR = 168 bpm GS intact within upper mid fundus    normal YS = 5.2 mm    Anterior pl, gr0, Rt ov with CL = 21 x 21 mm,  Simple echofree avascular cyst Rt ovary = 35 x 31 mm Normal Left ovary  -  neg adnexal regions  -  neg CDS,  no Free Fluid present

## 2023-03-26 ENCOUNTER — Inpatient Hospital Stay (HOSPITAL_COMMUNITY)
Admission: AD | Admit: 2023-03-26 | Discharge: 2023-03-27 | Disposition: A | Discharge status: Home / Self Care | Payer: Managed Care, Other (non HMO) | Attending: Obstetrics and Gynecology | Admitting: Obstetrics and Gynecology

## 2023-03-26 ENCOUNTER — Telehealth: Payer: Self-pay | Admitting: *Deleted

## 2023-03-26 DIAGNOSIS — O99511 Diseases of the respiratory system complicating pregnancy, first trimester: Secondary | ICD-10-CM | POA: Diagnosis not present

## 2023-03-26 DIAGNOSIS — J101 Influenza due to other identified influenza virus with other respiratory manifestations: Secondary | ICD-10-CM | POA: Diagnosis not present

## 2023-03-26 DIAGNOSIS — R519 Headache, unspecified: Secondary | ICD-10-CM | POA: Diagnosis present

## 2023-03-26 DIAGNOSIS — R509 Fever, unspecified: Secondary | ICD-10-CM | POA: Diagnosis present

## 2023-03-26 DIAGNOSIS — Z3A11 11 weeks gestation of pregnancy: Secondary | ICD-10-CM

## 2023-03-26 DIAGNOSIS — R112 Nausea with vomiting, unspecified: Secondary | ICD-10-CM

## 2023-03-26 LAB — URINALYSIS, ROUTINE W REFLEX MICROSCOPIC
Bilirubin Urine: NEGATIVE
Glucose, UA: NEGATIVE mg/dL
Ketones, ur: 5 mg/dL — AB
Leukocytes,Ua: NEGATIVE
Nitrite: NEGATIVE
Protein, ur: NEGATIVE mg/dL
Specific Gravity, Urine: 1.014 (ref 1.005–1.030)
pH: 5 (ref 5.0–8.0)

## 2023-03-26 LAB — RESP PANEL BY RT-PCR (RSV, FLU A&B, COVID)  RVPGX2
Influenza A by PCR: POSITIVE — AB
Influenza B by PCR: NEGATIVE
Resp Syncytial Virus by PCR: NEGATIVE
SARS Coronavirus 2 by RT PCR: NEGATIVE

## 2023-03-26 MED ORDER — ACETAMINOPHEN 325 MG PO TABS
650.0000 mg | ORAL_TABLET | Freq: Once | ORAL | Status: AC
  Administered 2023-03-26: 650 mg via ORAL
  Filled 2023-03-26: qty 2

## 2023-03-26 MED ORDER — GUAIFENESIN ER 600 MG PO TB12: 600.0000 mg | ORAL_TABLET | Freq: Two times a day (BID) | ORAL | 0 refills | Status: DC | PRN

## 2023-03-26 MED ORDER — ONDANSETRON 4 MG PO TBDP
4.0000 mg | ORAL_TABLET | Freq: Once | ORAL | Status: DC
  Filled 2023-03-26: qty 1

## 2023-03-26 MED ORDER — ONDANSETRON 4 MG PO TBDP: 4.0000 mg | ORAL_TABLET | Freq: Four times a day (QID) | ORAL | 0 refills | Status: DC | PRN

## 2023-03-26 MED ORDER — OSELTAMIVIR PHOSPHATE 75 MG PO CAPS
75.0000 mg | ORAL_CAPSULE | Freq: Every day | ORAL | 0 refills | Status: AC
Start: 2023-03-26 — End: 2023-03-31

## 2023-03-26 MED ORDER — FLUTICASONE PROPIONATE 50 MCG/ACT NA SUSP
2.0000 | Freq: Once | NASAL | Status: AC
  Administered 2023-03-26: 2 via NASAL
  Filled 2023-03-26: qty 16

## 2023-03-26 NOTE — MAU Provider Note (Signed)
 Chief Complaint: Nausea, Emesis, Headache, Generalized Body Aches, and Chills   Event Date/Time   First Provider Initiated Contact with Patient 03/26/23 2307        SUBJECTIVE HPI: Courtney Spencer is a 28 y.o. G2P0010 at [redacted]w[redacted]d by LMP who presents to maternity admissions reporting fever, chills, mild headache, nausea, cough and sore throat.  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 She denies vaginal bleeding, urinary symptoms, h/a.     Fever  This is a new problem. The current episode started yesterday. The problem has been unchanged. Her temperature was unmeasured prior to arrival. Associated symptoms include congestion, coughing, headaches, muscle aches, nausea and a sore throat. Pertinent negatives include no abdominal pain or wheezing. She has tried nothing for the symptoms.  Risk factors: occupational exposure    RN Note: Courtney Spencer is a 28 y.o. at [redacted]w[redacted]d here in MAU reporting: Since Saturday Feeling hot, chills, headache 2/10, pressure behind eyes, cough, emesis, sore throat 9/10, body aches 2/10 Was told to take robitussin but can not keep the medicine down.  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  Denies vaginal bleeding or lower abdominal cramping.   Past Medical History:  Diagnosis Date   Allergy    Anxiety    Cat scratch fever    Depression    Endometriosis    GERD (gastroesophageal reflux disease)    Heart murmur of newborn    Helicobacter pylori ab+    H.pylori serology positive in Jan 2015, treated with Amoxicillin and Flagyl at that time. Returned to clinic Feb 2015 and had rechecked, with a different provider treating with Amoxicillin and Biaxin. H.pylori stool antigen ordered April 2015 and negative.   Hematuria 08/09/2013   History of kidney stones    Kidney stones 10/26/2013   Migraines    Other and unspecified ovarian cyst 08/09/2013   Ovarian cyst    UTI (lower urinary tract infection)    Past  Surgical History:  Procedure Laterality Date   BIOPSY  04/21/2020   Procedure: BIOPSY;  Surgeon: Lanelle Bal, DO;  [facility];  Service: Endoscopy;;   CHOLECYSTECTOMY N/A 05/12/2015   Procedure: LAPAROSCOPIC CHOLECYSTECTOMY;  Surgeon: Franky Macho, MD;  Location: [facility];  Service: General;  Laterality: N/A;   ESOPHAGOGASTRODUODENOSCOPY N/A 08/13/2013   Dr. Darrick Penna: nodular gastritis, negative H.pylori   ESOPHAGOGASTRODUODENOSCOPY (EGD) WITH PROPOFOL N/A 04/21/2020   Surgeon: Lanelle Bal, DO; Small hiatal hernia, gastritis biopsied, normal examined duodenum biopsied, normal esophagus biopsied.  Pathology revealed benign duodenal biopsy, mild chronic gastritis negative for H. pylori, benign esophageal biopsy.   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   Tobacco Use   Smoking status: Never   Smokeless tobacco: Never   Tobacco comments:    Never really smoked  Vaping Use   Vaping status: Never Used  Substance and Sexual Activity   Alcohol use: No    Alcohol/week: 0.0 standard drinks of alcohol   Drug use: No   Sexual activity: Yes    Partners:  Male    Birth control/protection: None  Other Topics Concern   Not on file  Social History Narrative   Not on file   Social Drivers of Health   Financial Resource Strain: Low Risk  (01/14/2022)   Overall Financial Resource Strain (CARDIA)    Difficulty of Paying Living Expenses: Not hard at all  Food Insecurity: No Food Insecurity (01/14/2022)   Hunger Vital Sign    Worried About Running Out of Food in the Last Year: Never true    Ran Out of Food in the Last Year: Never true   Transportation Needs: No Transportation Needs (01/14/2022)   PRAPARE - Administrator, Civil Service (Medical): No    Lack of Transportation (Non-Medical): No  Physical Activity: Insufficiently Active (01/14/2022)   Exercise Vital Sign    Days of Exercise per Week: 1 day    Minutes of Exercise per Session: 20 min  Stress: Stress Concern Present (01/14/2022)   [employer] of Occupational Health - Occupational Stress Questionnaire    Feeling of Stress : Rather much  Social Connections: Moderately Isolated (01/14/2022)   Social Connection and Isolation Panel [NHANES]    Frequency of Communication with Friends and Family: More than three times a week    Frequency of Social Gatherings with Friends and Family: Three times a week    Attends Religious Services: Never    Active Member of Clubs or Organizations: No    Attends Banker Meetings: Never    Marital Status: Married  Catering manager Violence: Not At Risk (01/14/2022)   Humiliation, Afraid, Rape, and Kick questionnaire    Fear of Current or Ex-Partner: No    Emotionally Abused: No    Physically Abused: No    Sexually Abused: No   No current facility-administered medications on file prior to encounter.   Current Outpatient Medications on File Prior to Encounter  Medication Sig Dispense Refill   ondansetron (ZOFRAN) 4 MG tablet Take 1 tablet (4 mg total) by mouth every 8 (eight) hours as needed. 20 tablet 1   Cholecalciferol (VITAMIN D3) 125 MCG (5000 UT) CAPS Take by mouth.     Loratadine (CLARITIN PO) Take by mouth.     omeprazole (PRILOSEC) 40 MG capsule Take 1 capsule (40 mg total) by mouth in the morning and at bedtime. 180 capsule 1   prenatal vitamin w/FE, FA (PRENATAL 1 + 1) 27-1 MG TABS tablet Take 1 tablet by mouth daily at 12 noon. 30 tablet 12   Allergies  Allergen Reactions   Latex Other (See Comments)    Causes a burning sensation    I have reviewed patient's Past Medical Hx,  Surgical Hx, Family Hx, Social Hx, medications and allergies.   ROS:  Review of Systems  Constitutional:  Positive for fever.  HENT:  Positive for congestion and sore throat.   Respiratory:  Positive for cough. Negative for wheezing.   Gastrointestinal:  Positive for nausea. Negative for abdominal pain.  Neurological:  Positive for headaches.   Review of Systems  Other systems negative   Physical Exam  Physical Exam Patient Vitals for the past 24 hrs:  BP Temp Temp src Pulse Resp SpO2 Height Weight  03/26/23 1947 106/70 97.8 F (36.6 C) Oral 80 18 100 % 4\' 11"  (1.499 m) 87.2 kg   Constitutional: Well-developed, well-nourished female in no acute distress.  Cardiovascular: normal rate Respiratory: normal effort, lungs CTAB, no wheezing GI: Abd soft, non-tender.  MS: Extremities nontender, no  edema, normal ROM Neurologic: Alert and oriented x 4.  GU: Neg CVAT. FHT 175 by doppler  LAB RESULTS Results for orders placed or performed during the hospital encounter of 03/26/23 (from the past 24 hours)  Resp panel by RT-PCR (RSV, Flu A&B, Covid) Anterior Nasal Swab     Status: Abnormal   Collection Time: 03/26/23  7:05 PM   Specimen: Anterior Nasal Swab  Result Value Ref Range   SARS Coronavirus 2 by RT PCR NEGATIVE NEGATIVE   Influenza A by PCR POSITIVE (A) NEGATIVE   Influenza B by PCR NEGATIVE NEGATIVE   Resp Syncytial Virus by PCR NEGATIVE NEGATIVE  Urinalysis, Routine w reflex microscopic -Urine, Clean Catch     Status: Abnormal   Collection Time: 03/26/23  7:58 PM  Result Value Ref Range   Color, Urine YELLOW YELLOW   APPearance CLEAR CLEAR   Specific Gravity, Urine 1.014 1.005 - 1.030   pH 5.0 5.0 - 8.0   Glucose, UA NEGATIVE NEGATIVE mg/dL   Hgb urine dipstick SMALL (A) NEGATIVE   Bilirubin Urine NEGATIVE NEGATIVE   Ketones, ur 5 (A) NEGATIVE mg/dL   Protein, ur NEGATIVE NEGATIVE mg/dL   Nitrite NEGATIVE NEGATIVE   Leukocytes,Ua NEGATIVE NEGATIVE   RBC / HPF 0-5 0  - 5 RBC/hpf   WBC, UA 0-5 0 - 5 WBC/hpf   Bacteria, UA RARE (A) NONE SEEN   Squamous Epithelial / HPF 0-5 0 - 5 /HPF   Mucus PRESENT     O/Positive/-- (07/30 1225)  IMAGING   MAU Management/MDM: I have reviewed the triage vital signs and the nursing notes.   Pertinent labs & imaging results that were available during my care of the patient were reviewed by me and considered in my medical decision making (see chart for details).      I have reviewed her medical records including past results, notes and treatments. Medical, Surgical, and family history were reviewed.  Medications and recent lab tests were reviewed  Ordered Respiratory Panel and UA.  UA is clear   Panel is positive for Influenza A.  Treatments in MAU included Declined Zofran.  Given Flonase for congestion and Tylenol for fever. .   Discussed care and course of Flu Will prescribe Tamiflu  ASSESSMENT Single IUP at [redacted]w[redacted]d Influenza A Nausea  PLAN Discharge home Rx Mucinex for cough Rx Tamiflu for Flu Rx Zofran for nausea Flu precautions  Pt stable at time of discharge. Encouraged to return here if she develops worsening of symptoms, increase in pain, fever, or other concerning symptoms.    Wynelle Bourgeois CNM, MSN Certified Nurse-Midwife 03/26/2023  11:07 PM

## 2023-03-26 NOTE — Telephone Encounter (Signed)
Patient called with c/o cough and runny nose.  States on Saturday she developed a cough, runny nose, sneezing, headache, fever, chills, and pressure behind her eyes. Went to [facility] in [place] and tested negative for Flu/Covid and strep and was advised to take Robitussin DM but could not tolerate it as it made her sick. Still having a runny nose and a dry cough that at times "feels deep".  Discussed with KRB and safe meds to take during pregnancy for cold etc sent to patient and advised to push more fluids.  Informed cough may linger for a while and if viral will need to run its course.  Pt was given a note from Urgent Care to be out of work Monday, Tuesday and Wednesday but is requesting not for tomorrow as she is still frequently coughing.  Note sent via mychart for Thursday.

## 2023-03-26 NOTE — MAU Note (Signed)
..  Kynley Judie Petit Enriqueta Augusta is a 28 y.o. at [redacted]w[redacted]d here in MAU reporting: Since Saturday Feeling hot, chills, headache 2/10, pressure behind eyes, cough, emesis, sore throat 9/10, body aches 2/10 Was told to take robitussin but can not keep the medicine down.  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  Denies vaginal bleeding or lower abdominal cramping.   Pain score: /10 Vitals:   03/26/23 1947  BP: 106/70  Pulse: 80  Resp: 18  Temp: 97.8 F (36.6 C)  SpO2: 100%     FHT:175 Lab orders placed from triage: UA

## 2023-03-27 DIAGNOSIS — J101 Influenza due to other identified influenza virus with other respiratory manifestations: Secondary | ICD-10-CM

## 2023-03-27 DIAGNOSIS — O99511 Diseases of the respiratory system complicating pregnancy, first trimester: Secondary | ICD-10-CM

## 2023-03-27 DIAGNOSIS — R112 Nausea with vomiting, unspecified: Secondary | ICD-10-CM

## 2023-03-27 DIAGNOSIS — Z3A11 11 weeks gestation of pregnancy: Secondary | ICD-10-CM

## 2023-03-31 ENCOUNTER — Other Ambulatory Visit: Payer: Self-pay | Admitting: Adult Health

## 2023-03-31 MED ORDER — PROMETHAZINE HCL 25 MG PO TABS
25.0000 mg | ORAL_TABLET | Freq: Four times a day (QID) | ORAL | 1 refills | Status: DC | PRN
Start: 2023-03-31 — End: 2023-09-02

## 2023-03-31 NOTE — Progress Notes (Signed)
Rx phenergan

## 2023-04-11 ENCOUNTER — Other Ambulatory Visit: Payer: Self-pay | Admitting: Obstetrics & Gynecology

## 2023-04-11 DIAGNOSIS — O0990 Supervision of high risk pregnancy, unspecified, unspecified trimester: Secondary | ICD-10-CM | POA: Insufficient documentation

## 2023-04-11 DIAGNOSIS — Z3490 Encounter for supervision of normal pregnancy, unspecified, unspecified trimester: Secondary | ICD-10-CM | POA: Insufficient documentation

## 2023-04-11 DIAGNOSIS — Z3682 Encounter for antenatal screening for nuchal translucency: Secondary | ICD-10-CM

## 2023-04-14 ENCOUNTER — Ambulatory Visit (INDEPENDENT_AMBULATORY_CARE_PROVIDER_SITE_OTHER): Payer: Managed Care, Other (non HMO) | Admitting: Advanced Practice Midwife

## 2023-04-14 ENCOUNTER — Encounter: Payer: Managed Care, Other (non HMO) | Admitting: *Deleted

## 2023-04-14 ENCOUNTER — Other Ambulatory Visit (HOSPITAL_COMMUNITY)
Admission: RE | Admit: 2023-04-14 | Discharge: 2023-04-14 | Disposition: A | Discharge status: Home / Self Care | Payer: Managed Care, Other (non HMO) | Source: Ambulatory Visit | Attending: Advanced Practice Midwife | Admitting: Advanced Practice Midwife

## 2023-04-14 ENCOUNTER — Encounter: Payer: Self-pay | Admitting: Advanced Practice Midwife

## 2023-04-14 ENCOUNTER — Ambulatory Visit: Payer: Managed Care, Other (non HMO)

## 2023-04-14 VITALS — BP 126/79 | HR 81 | Wt 194.0 lb

## 2023-04-14 DIAGNOSIS — Z363 Encounter for antenatal screening for malformations: Secondary | ICD-10-CM

## 2023-04-14 DIAGNOSIS — Z6838 Body mass index (BMI) 38.0-38.9, adult: Secondary | ICD-10-CM

## 2023-04-14 DIAGNOSIS — Z3480 Encounter for supervision of other normal pregnancy, unspecified trimester: Secondary | ICD-10-CM | POA: Insufficient documentation

## 2023-04-14 DIAGNOSIS — Z131 Encounter for screening for diabetes mellitus: Secondary | ICD-10-CM

## 2023-04-14 DIAGNOSIS — Z3A13 13 weeks gestation of pregnancy: Secondary | ICD-10-CM | POA: Insufficient documentation

## 2023-04-14 DIAGNOSIS — Z3682 Encounter for antenatal screening for nuchal translucency: Secondary | ICD-10-CM

## 2023-04-14 DIAGNOSIS — Z3481 Encounter for supervision of other normal pregnancy, first trimester: Secondary | ICD-10-CM | POA: Diagnosis not present

## 2023-04-14 DIAGNOSIS — F411 Generalized anxiety disorder: Secondary | ICD-10-CM

## 2023-04-14 MED ORDER — ASPIRIN 81 MG PO TBEC: 162.0000 mg | DELAYED_RELEASE_TABLET | Freq: Every day | ORAL | 6 refills | Status: DC

## 2023-04-14 NOTE — Progress Notes (Signed)
 [place] 13+5 wks,measurements c/w dates,FHR 155 bpm,simple right corpus luteal cyst 2.9 x 2.1 x 2.3 cm,normal left ovary,NB present,NT 1.9 mm,CRL 72.64 mm

## 2023-04-14 NOTE — Patient Instructions (Addendum)
 [facility] offers online women's holistic mental health counseling and therapy provided by licensed mental health counselors and therapists.   You can refer yourself using the link below: (if it isn't clickable from your mychart account, copy and paste it in a new browser).  If you have ANY problems, please let me know and I will help troubleshoot.   [email or web address]  OR  [email or web address]     Courtney Spencer, I greatly value your feedback.  If you receive a survey following your visit with [place] today, we appreciate you taking the time to fill it out.  Thanks, Cathie Beams, DNP, CNM  [facility] HAS MOVED!!! It is now [facility] ([address] [place], [place] [address]) Entrance located off of [address] Free 24/7 valet parking   Nausea & Vomiting Have saltine crackers or pretzels by your bed and eat a few bites before you raise your head out of bed in the morning Eat small frequent meals throughout the day instead of large meals Drink plenty of fluids throughout the day to stay hydrated, just don't drink a lot of fluids with your meals.  This can make your stomach fill up faster making you feel sick Do not brush your teeth right after you eat Products with real ginger are good for nausea, like ginger ale and ginger hard candy Make sure it says made with real ginger! Sucking on sour candy like lemon heads is also good for nausea If your prenatal vitamins make you nauseated, take them at night so you will sleep through the nausea Sea Bands If you feel like you need medicine for the nausea & vomiting please let us know If you are unable to keep any fluids or food down please let us know   Constipation Drink plenty of fluid, preferably water, throughout the day Eat foods high in fiber such as fruits, vegetables, and grains Exercise, such as walking, is  a good way to keep your bowels regular Drink warm fluids, especially warm prune juice, or decaf coffee Eat a 1/2 cup of real oatmeal (not instant), 1/2 cup applesauce, and 1/2-1 cup warm prune juice every day If needed, you may take Colace (docusate sodium) stool softener once or twice a day to help keep the stool soft.  If you still are having problems with constipation, you may take Miralax once daily as needed to help keep your bowels regular.   Home Blood Pressure Monitoring for Patients   Your provider has recommended that you check your blood pressure (BP) at least once a week at home. If you do not have a blood pressure cuff at home, one will be provided for you. Contact your provider if you have not received your monitor within 1 week.   Helpful Tips for Accurate Home Blood Pressure Checks  Don't smoke, exercise, or drink caffeine 30 minutes before checking your BP Use the restroom before checking your BP (a full bladder can raise your pressure) Relax in a comfortable upright chair Feet on the ground Left arm resting comfortably on a flat surface at the level of your heart Legs uncrossed Back supported Sit quietly and don't talk Place the cuff on your bare arm Adjust snuggly, so that only two fingertips can fit between your skin and the top of the cuff Check 2 readings separated by at least one minute Keep a log of your BP readings For a visual, please reference this diagram: http://ccnc.care/bpdiagram  Provider Name: [facility]     Phone: [phone]  Zone 1: ALL CLEAR  Continue to monitor your symptoms:  BP reading is less than 140 (top number) or less than 90 (bottom number)  No right upper stomach pain No headaches or seeing spots No feeling nauseated or throwing up No swelling in face and hands  Zone 2: CAUTION Call your doctor's office for any of the following:  BP reading is greater than 140 (top number) or greater than 90 (bottom number)  Stomach pain  under your ribs in the middle or right side Headaches or seeing spots Feeling nauseated or throwing up Swelling in face and hands  Zone 3: EMERGENCY  Seek immediate medical care if you have any of the following:  BP reading is greater than160 (top number) or greater than 110 (bottom number) Severe headaches not improving with Tylenol Serious difficulty catching your breath Any worsening symptoms from Zone 2    First Trimester of Pregnancy The first trimester of pregnancy is from week 1 until the end of week 12 (months 1 through 3). A week after a sperm fertilizes an egg, the egg will implant on the wall of the uterus. This embryo will begin to develop into a baby. Genes from you and your partner are forming the baby. The female genes determine whether the baby is a boy or a girl. At 6-8 weeks, the eyes and face are formed, and the heartbeat can be seen on ultrasound. At the end of 12 weeks, all the baby's organs are formed.  Now that you are pregnant, you will want to do everything you can to have a healthy baby. Two of the most important things are to get good prenatal care and to follow your health care provider's instructions. Prenatal care is all the medical care you receive before the baby's birth. This care will help prevent, find, and treat any problems during the pregnancy and childbirth. BODY CHANGES Your body goes through many changes during pregnancy. The changes vary from woman to woman.  You may gain or lose a couple of pounds at first. You may feel sick to your stomach (nauseous) and throw up (vomit). If the vomiting is uncontrollable, call your health care provider. You may tire easily. You may develop headaches that can be relieved by medicines approved by your health care provider. You may urinate more often. Painful urination may mean you have a bladder infection. You may develop heartburn as a result of your pregnancy. You may develop constipation because certain hormones are  causing the muscles that push waste through your intestines to slow down. You may develop hemorrhoids or swollen, bulging veins (varicose veins). Your breasts may begin to grow larger and become tender. Your nipples may stick out more, and the tissue that surrounds them (areola) may become darker. Your gums may bleed and may be sensitive to brushing and flossing. Dark spots or blotches (chloasma, mask of pregnancy) may develop on your face. This will likely fade after the baby is born. Your menstrual periods will stop. You may have a loss of appetite. You may develop cravings for certain kinds of food. You may have changes in your emotions from day to day, such as being excited to be pregnant or being concerned that something may go wrong with the pregnancy and baby. You may have more vivid and strange dreams. You may have changes in your hair. These can include thickening of your hair, rapid growth, and changes in texture. Some women also  have hair loss during or after pregnancy, or hair that feels dry or thin. Your hair will most likely return to normal after your baby is born. WHAT TO EXPECT AT YOUR PRENATAL VISITS During a routine prenatal visit: You will be weighed to make sure you and the baby are growing normally. Your blood pressure will be taken. Your abdomen will be measured to track your baby's growth. The fetal heartbeat will be listened to starting around week 10 or 12 of your pregnancy. Test results from any previous visits will be discussed. Your health care provider may ask you: How you are feeling. If you are feeling the baby move. If you have had any abnormal symptoms, such as leaking fluid, bleeding, severe headaches, or abdominal cramping. If you have any questions. Other tests that may be performed during your first trimester include: Blood tests to find your blood type and to check for the presence of any previous infections. They will also be used to check for low iron  levels (anemia) and Rh antibodies. Later in the pregnancy, blood tests for diabetes will be done along with other tests if problems develop. Urine tests to check for infections, diabetes, or protein in the urine. An ultrasound to confirm the proper growth and development of the baby. An amniocentesis to check for possible genetic problems. Fetal screens for spina bifida and Down syndrome. You may need other tests to make sure you and the baby are doing well. HOME CARE INSTRUCTIONS  Medicines Follow your health care provider's instructions regarding medicine use. Specific medicines may be either safe or unsafe to take during pregnancy. Take your prenatal vitamins as directed. If you develop constipation, try taking a stool softener if your health care provider approves. Diet Eat regular, well-balanced meals. Choose a variety of foods, such as meat or vegetable-based protein, fish, milk and low-fat dairy products, vegetables, fruits, and whole grain breads and cereals. Your health care provider will help you determine the amount of weight gain that is right for you. Avoid raw meat and uncooked cheese. These carry germs that can cause birth defects in the baby. Eating four or five small meals rather than three large meals a day may help relieve nausea and vomiting. If you start to feel nauseous, eating a few soda crackers can be helpful. Drinking liquids between meals instead of during meals also seems to help nausea and vomiting. If you develop constipation, eat more high-fiber foods, such as fresh vegetables or fruit and whole grains. Drink enough fluids to keep your urine clear or pale yellow. Activity and Exercise Exercise only as directed by your health care provider. Exercising will help you: Control your weight. Stay in shape. Be prepared for labor and delivery. Experiencing pain or cramping in the lower abdomen or low back is a good sign that you should stop exercising. Check with your  health care provider before continuing normal exercises. Try to avoid standing for long periods of time. Move your legs often if you must stand in one place for a long time. Avoid heavy lifting. Wear low-heeled shoes, and practice good posture. You may continue to have sex unless your health care provider directs you otherwise. Relief of Pain or Discomfort Wear a good support bra for breast tenderness.   Take warm sitz baths to soothe any pain or discomfort caused by hemorrhoids. Use hemorrhoid cream if your health care provider approves.   Rest with your legs elevated if you have leg cramps or low back pain. If  you develop varicose veins in your legs, wear support hose. Elevate your feet for 15 minutes, 3-4 times a day. Limit salt in your diet. Prenatal Care Schedule your prenatal visits by the twelfth week of pregnancy. They are usually scheduled monthly at first, then more often in the last 2 months before delivery. Write down your questions. Take them to your prenatal visits. Keep all your prenatal visits as directed by your health care provider. Safety Wear your seat belt at all times when driving. Make a list of emergency phone numbers, including numbers for family, friends, the hospital, and police and fire departments. General Tips Ask your health care provider for a referral to a local prenatal education class. Begin classes no later than at the beginning of month 6 of your pregnancy. Ask for help if you have counseling or nutritional needs during pregnancy. Your health care provider can offer advice or refer you to specialists for help with various needs. Do not use hot tubs, steam rooms, or saunas. Do not douche or use tampons or scented sanitary pads. Do not cross your legs for long periods of time. Avoid cat litter boxes and soil used by cats. These carry germs that can cause birth defects in the baby and possibly loss of the fetus by miscarriage or stillbirth. Avoid all smoking,  herbs, alcohol, and medicines not prescribed by your health care provider. Chemicals in these affect the formation and growth of the baby. Schedule a dentist appointment. At home, brush your teeth with a soft toothbrush and be gentle when you floss. SEEK MEDICAL CARE IF:  You have dizziness. You have mild pelvic cramps, pelvic pressure, or nagging pain in the abdominal area. You have persistent nausea, vomiting, or diarrhea. You have a bad smelling vaginal discharge. You have pain with urination. You notice increased swelling in your face, hands, legs, or ankles. SEEK IMMEDIATE MEDICAL CARE IF:  You have a fever. You are leaking fluid from your vagina. You have spotting or bleeding from your vagina. You have severe abdominal cramping or pain. You have rapid weight gain or loss. You vomit blood or material that looks like coffee grounds. You are exposed to [place] measles and have never had them. You are exposed to fifth disease or chickenpox. You develop a severe headache. You have shortness of breath. You have any kind of trauma, such as from a fall or a car accident. Document Released: 02/05/2001 Document Revised: 06/28/2013 Document Reviewed: 12/22/2012 [facility] Patient Information 2015 [place], [place]. This information is not intended to replace advice given to you by your health care provider. Make sure you discuss any questions you have with your health care provider.  ADDITIONAL HEALTHCARE OPTIONS FOR PATIENTS  [facility] / e-Visit: [email or web address]         [facility]: [phone]  [facility]: [phone]                   [facility]: [phone]     Safe Medications in Pregnancy   Acne: Benzoyl Peroxide Salicylic Acid  Backache/Headache: Tylenol: 2 regular strength every 4 hours OR              2 Extra strength every 6 hours  Colds/Coughs/Allergies: Benadryl  (alcohol free) 25 mg every 6 hours as needed Breath right strips Claritin Cepacol throat lozenges Chloraseptic throat spray Cold-Eeze- up to three times per day Cough drops, alcohol free Flonase (by prescription only) Guaifenesin Mucinex Robitussin DM (plain only,  alcohol free) Saline nasal spray/drops Sudafed (pseudoephedrine) & Actifed ** use only after [redacted] weeks gestation and if you do not have high blood pressure Tylenol Vicks Vaporub Zinc lozenges Zyrtec   Constipation: Colace Ducolax suppositories Fleet enema Glycerin suppositories Metamucil Milk of magnesia Miralax Senokot Smooth move tea  Diarrhea: Kaopectate Imodium A-D  *NO pepto Bismol  Hemorrhoids: Anusol Anusol HC Preparation H Tucks  Indigestion: Tums Maalox Mylanta Zantac  Pepcid  Insomnia: Benadryl (alcohol free) 25mg  every 6 hours as needed Tylenol PM Unisom, no Gelcaps  Leg Cramps: Tums MagGel  Nausea/Vomiting:  Bonine Dramamine Emetrol Ginger extract Sea bands Meclizine  Nausea medication to take during pregnancy:  Unisom (doxylamine succinate 25 mg tablets) Take one tablet daily at bedtime. If symptoms are not adequately controlled, the dose can be increased to a maximum recommended dose of two tablets daily (1/2 tablet in the morning, 1/2 tablet mid-afternoon and one at bedtime). Vitamin B6 100mg  tablets. Take one tablet twice a day (up to 200 mg per day).  Skin Rashes: Aveeno products Benadryl cream or 25mg  every 6 hours as needed Calamine Lotion 1% cortisone cream  Yeast infection: Gyne-lotrimin 7 Monistat 7   **If taking multiple medications, please check labels to avoid duplicating the same active ingredients **take medication as directed on the label ** Do not exceed 4000 mg of tylenol in 24 hours **Do not take medications that contain aspirin or ibuprofen   [phone] is the phone number for Pregnancy Classes or hospital tours at [facility].    You will be referred to  [email or web address]   for more information on childbirth classes   At this site you may register for classes. You may sign up for a waiting list if classes are full. Please SIGN UP FOR THIS!.   When the waiting list becomes long, sometimes new classes can be added.  [facility] Call to Register: [phone] or [phone]   or   Register Online: [email or web address] THESE CLASSES FILL UP VERY QUICKLY, SO SIGN UP AS SOON AS YOU CAN!!! Please visit [facility] pregnancy website at [email or web address]  Childbirth Classes  Option 1: Birth & Baby Series Series of 3 weekly classes, on the same day of the week (can choose Mon-Thurs) from 6-9pm Helps you and your support person prepare for childbirth Reviews newborn care, labor & birth, cesarean birth, pain management, and comfort techniques Cost: $60 per couple for insured or self-pay, $30 per couple for Medicaid  Option 2: Weekend Birth & Baby This class is a weekend version of our Birth & Baby series.  It is designed for parents who have a difficult time fitting several weeks of classes into their schedule.   Covers the care of your newborn and the basics of labor and childbirth Friday 6:30pm-8:30pm Saturday 9am-4pm, includes lunch for you and your partner  Cost: $75 per couple for insured or self-pay, $30 per couple for Medicaid  Option 3: Natural Childbirth This series of 5 weekly classes is for expectant parents who want to learn and practice natural methods of coping with the process of labor and childbirth.  Can choose Mon or Tues, 7-9pm.   Covers relaxation, breathing, massage, visualization, role of the partner, and helpful positioning Participants learn how to be confident in their body's ability to give birth. Class empowers and helps parents make informed decisions about care. Includes  discussion that will help new parents transition into the immediate postpartum period.  Cost: $75 per couple for insured  or self-pay, $30 per couple for Medicaid  Option 4: Online Birth & Baby This online class offers you the freedom to complete a Birth & Baby series in the comfort of your own home.  The flexibility of this option allows you to review sections at your own pace, at times convenient to you and your support people.  It includes additional video information, animations, quizzes and extended activities. Get organized with helpful eClass tools, checklists, and trackers.  Cost: $60 for 60 days of online access                                                                            Other Available Classes  Baby & Me Enjoy this time to discuss newborn & infant parenting topics and family adjustment issues with other new mothers in a relaxed environment. Each week brings a new speaker or baby-centered activity. We encourage mothers and their babies (birth to crawling) to join [place]. You are welcome to visit this group even if you haven't delivered yet! It's wonderful to make new friends early and watch other moms interact with their babies. No registration or fee.  Big Brother/Big Sister Let your children share in the joy of a new brother or sister in this special class designed just for them. Discussion includes how families care for babies: swaddling, holding, diapering, safety, as well as how they can be helpful in their new role. This class is designed for children ages 2 to 11, but any age is welcome. Please register each child individually. $5 Breastfeeding Support Group This group is a mother-to-mother support circle where moms have the opportunity to share their breastfeeding experiences. A Breastfeeding Support nurse is present for questions and concerns. An infant scale is available for weight checks. No fee or registration.  Breastfeeding Your Baby Breastfeeding is a special time  for mother and child. This class will help you feel ready to begin this important relationship. Your partner is encouraged to attend with you. Learn what to expect and feel more confident in the first days of breastfeeding your newborn. This class also addresses the most common fears and challenges of breastfeeding during the first few weeks, months, and beyond. $30 per couple Caring for Baby This class is for expectant and adoptive parents who want to learn and practice the most up-to-date newborn care for their babies. Focus is on birth through first six weeks of life. Topics include feeding, bathing, diapering, crying, umbilical cord care, circumcision care and safe sleep. Parents learn how to recognize symptoms of illness and when to call the pediatrician. Register only the mom-to-be and your partner can plan to come with you. (*Note: This class is included in the Birth & Baby series and the Weekend Birth & Baby classes.) $10 per couple Comfort Techniques & Tour This 2-hour interactive class is designed for those who either do not wish to take the Birth & Baby series or for those who prefer our online childbirth class, but don't want to miss the opportunity to learn and practice hands-on techniques. These skills can help relieve some of the discomfort of labor and encourage your baby to rotate toward the best position for birth. You and your partner will  be able to try a variety of labor positions with birth balls and rebozos as well as practice breathing, relaxation, and visual techniques. $20 per couple [employer] [facility] This course offers Dads-to-be the tools and knowledge needed to feel confident on their journey to becoming new fathers. Experienced dads, who have been trained as coaches, teach dads-to-be how to hold, comfort, diapers, swaddle and play with their infant while being able to support the new mom as well. $25 Grandparent Love Expecting a grandbaby? Learn about the latest infant care and  safety recommendations and ways to support your own child as he or she transitions into the parenting role. $10 per person Infant and Child CPR Parents, grandparents, babysitters, and friends learn Cardio-Pulmonary Resuscitation skills for infants and children. You will also learn how to treat both conscious and unconscious choking infants and children. Register each participant individually. (Note: This Family & Friends program does not offer certification.) $20 per person Marvelous Multiples Expecting twins, triplets, or more? This free 2-hour class covers the differences in labor, birth, parenting, and breastfeeding issues that face multiples' parents.  Maternity Care Center Virtual Tour  Online virtual tour of the new [place] [facility] Talk This free mom-led group offers support and connection to mothers as they journey through the adjustments and struggles of that sometimes overwhelming first year after the birth of a child. A member of our staff will be present to share resources and additional support if needed, as you care for yourself and baby. You are welcome to visit this group before you deliver! It's wonderful to meet new friends early and watch other moms interact with their babies.  Waterbirth Class Interested in a waterbirth? This free informational class will help you discover whether waterbirth is the right fit for you and is required if you are planning a waterbirth. Education about waterbirth itself, supplies you may need, and what you may need from your support team is included in this class. Partners are encouraged to come.

## 2023-04-14 NOTE — Progress Notes (Signed)
 INITIAL OBSTETRICAL VISIT Patient name: Courtney Spencer MRN [id number]  Date of birth: [birth year] Chief Complaint:   Initial Prenatal Visit  History of Present Illness:   Courtney Spencer is a 28 y.o. G2P0010  female at [redacted]w[redacted]d by [place] at 7 weeks with an Estimated Date of Delivery: 10/15/23 being seen today for her initial obstetrical visit.   Her obstetrical history is significant for early SAB.   Today she reports no complaints.     04/14/2023    3:51 PM 01/14/2022    3:32 PM 06/08/2021    1:13 PM 12/29/2020    2:57 PM 12/11/2020    8:42 AM  Depression screen PHQ 2/9  Decreased Interest 1 1 0 0 0  Down, Depressed, Hopeless 0 1 0 0 0  PHQ - 2 Score 1 2 0 0 0  Altered sleeping 0 1 1 0   Tired, decreased energy 2 1 2  0   Change in appetite 0 0 1 0   Feeling bad or failure about yourself  0 1 0 0   Trouble concentrating 0 0 0 0   Moving slowly or fidgety/restless 0 0 0 0   Suicidal thoughts 0 0 0 0   PHQ-9 Score 3 5 4  0   Difficult doing work/chores   Not difficult at all Not difficult at all     Patient's last menstrual period was 01/08/2023 (approximate). Last pap  Diagnosis  Date Value Ref Range Status  12/10/2019   Final   - Negative for intraepithelial lesion or malignancy (NILM)   Review of Systems:   Pertinent items are noted in HPI Denies cramping/contractions, leakage of fluid, vaginal bleeding, abnormal vaginal discharge w/ itching/odor/irritation, headaches, visual changes, shortness of breath, chest pain, abdominal pain, severe nausea/vomiting, or problems with urination or bowel movements unless otherwise stated above.  Pertinent History Reviewed:  Reviewed past medical,surgical, social, obstetrical and family history.  Reviewed problem list, medications and allergies. OB History  Gravida Para Term Preterm AB Living  2    1   SAB IAB Ectopic Multiple Live Births  1        # Outcome Date GA Lbr Len/2nd Weight Sex Type Anes PTL Lv  2 Current            1 SAB 09/2022           Physical Assessment:   Vitals:   04/14/23 1549  BP: 126/79  Pulse: 81  Weight: 194 lb (88 kg)  Body mass index is 39.18 kg/m.       Physical Examination:  General appearance - well appearing, and in no distress  Mental status - alert, oriented to person, place, and time  Psych:  She has a normal mood and affect  Skin - warm and dry, normal color, no suspicious lesions noted  Chest - effort normal  Heart - normal rate and regular rhythm  Abdomen - soft, nontender  Extremities:  No swelling or varicosities noted  Pelvic - VULVA: normal appearing vulva with no masses, tenderness or lesions  VAGINA: normal appearing vagina with normal color and discharge, no lesions  CERVIX: normal appearing cervix without discharge or lesions, no CMT  Thin prep pap is done with HR HPV cotesting  TODAY'S NT [place] 13+5 wks,measurements c/w dates,FHR 155 bpm,simple right corpus luteal cyst 2.9 x 2.1 x 2.3 cm,normal left ovary,NB present,NT 1.9 mm,CRL 72.64 mm   No results found for this or any previous visit (from the  past 24 hours).    Two or more of the following: Nulliparity Yes Obesity (body mass index >30 kg/m2) Yes     04/14/2023    3:51 PM 01/14/2022    3:32 PM 06/08/2021    1:13 PM 12/29/2020    2:57 PM 12/11/2020    8:42 AM  Depression screen PHQ 2/9  Decreased Interest 1 1 0 0 0  Down, Depressed, Hopeless 0 1 0 0 0  PHQ - 2 Score 1 2 0 0 0  Altered sleeping 0 1 1 0   Tired, decreased energy 2 1 2  0   Change in appetite 0 0 1 0   Feeling bad or failure about yourself  0 1 0 0   Trouble concentrating 0 0 0 0   Moving slowly or fidgety/restless 0 0 0 0   Suicidal thoughts 0 0 0 0   PHQ-9 Score 3 5 4  0   Difficult doing work/chores   Not difficult at all Not difficult at all         04/14/2023    3:51 PM 01/14/2022    3:32 PM 12/10/2019    1:04 PM  GAD 7 : Generalized Anxiety Score  Nervous, Anxious, on Edge 1 1 1   Control/stop worrying 0 1 1   Worry too much - different things 0 1 0  Trouble relaxing 0 1 0  Restless 0 0 0  Easily annoyed or irritable 0 0 0  Afraid - awful might happen 0 0 2  Total GAD 7 Score 1 4 4       Assessment & Plan:  1) Low-Risk Pregnancy G2P0010 at [redacted]w[redacted]d with an Estimated Date of Delivery: 10/15/23   2) Initial OB visit    1. Supervision of other normal pregnancy, antepartum (Primary)  - Integrated 1 - Urine Culture - Hemoglobin A1c - HORIZON CUSTOM - PANORAMA PRENATAL TEST - CHL AMB BABYSCRIPTS SCHEDULE OPTIMIZATION - CBC/D/Plt+RPR+Rh+ABO+RubIgG... - Cytology - PAP  2. [redacted] weeks gestation of pregnancy   3. Diabetes mellitus screening  - Hemoglobin A1c  4. Adult body mass index 38.0-38.9 ASA 162mg  EFW 32 weeks - Hemoglobin A1c  5. Anxiety, generalized  Feels well, luna joy infor given just in case       Meds:  Meds ordered this encounter  Medications   aspirin EC 81 MG tablet    Sig: Take 2 tablets (162 mg total) by mouth daily.    Dispense:  60 tablet    Refill:  6    Supervising Provider:   Duane Lope H [[id number]]    Initial labs obtained Continue prenatal vitamins Reviewed n/v relief measures and warning s/s to report Reviewed recommended weight gain based on pre-gravid BMI Encouraged well-balanced diet Genetic & carrier screening discussed: requests Panorama, NT/IT, and Horizon , declines AFP Ultrasound discussed; fetal survey: requested CCNC completed> form faxed if has or is planning to apply for medicaid The nature of [facility] [employer] with multiple MDs and other Advanced Practice Providers was explained to patient; also emphasized that fellows, residents, and students are part of our team. Given a home bp cuff.. Check bp weekly, let us know if >140/90.        Scarlette Calico Cresenzo-Dishmon 4:34 PM

## 2023-04-15 LAB — INTEGRATED 1

## 2023-04-16 LAB — INTEGRATED 1
Crown Rump Length: 72.6 mm
Gest. Age on Collection Date: 13.1 wk
PAPP-A Value: 532.4 ng/mL
Race: 1
Sonographer ID#: 29.2 a
Sonographer ID#: 309760
Weight: 1.9 mm
Weight: 193 [lb_av]

## 2023-04-16 LAB — CBC/D/PLT+RPR+RH+ABO+RUBIGG...
Antibody Screen: NEGATIVE
Basophils Absolute: 0 10*3/uL (ref 0.0–0.2)
Basos: 0 %
EOS (ABSOLUTE): 0 10*3/uL (ref 0.0–0.4)
Eos: 0 %
HCV Ab: NONREACTIVE
HIV Screen 4th Generation wRfx: NONREACTIVE
Hematocrit: 34.5 % (ref 34.0–46.6)
Hemoglobin: 11.5 g/dL (ref 11.1–15.9)
Hepatitis B Surface Ag: NEGATIVE
Immature Grans (Abs): 0 10*3/uL (ref 0.0–0.1)
Immature Granulocytes: 0 %
Lymphocytes Absolute: 1.9 10*3/uL (ref 0.7–3.1)
Lymphs: 17 %
MCH: 28.2 pg (ref 26.6–33.0)
MCHC: 33.3 g/dL (ref 31.5–35.7)
MCV: 85 fL (ref 79–97)
Monocytes Absolute: 0.6 10*3/uL (ref 0.1–0.9)
Monocytes: 6 %
Neutrophils Absolute: 8.6 10*3/uL — ABNORMAL HIGH (ref 1.4–7.0)
Neutrophils: 77 %
Platelets: 329 10*3/uL (ref 150–450)
RBC: 4.08 x10E6/uL (ref 3.77–5.28)
RDW: 13.9 % (ref 11.7–15.4)
RPR Ser Ql: NONREACTIVE
Rh Factor: POSITIVE
Rubella Antibodies, IGG: <0.9 {index} — ABNORMAL LOW (ref >0.99)
WBC: 11.2 10*3/uL — ABNORMAL HIGH (ref 3.4–10.8)

## 2023-04-16 LAB — HCV INTERPRETATION

## 2023-04-16 LAB — HEMOGLOBIN A1C
Est. average glucose Bld gHb Est-mCnc: 100 mg/dL
Hgb A1c MFr Bld: 5.1 % (ref 4.8–5.6)

## 2023-04-17 ENCOUNTER — Encounter: Payer: Self-pay | Admitting: Advanced Practice Midwife

## 2023-04-17 DIAGNOSIS — O09899 Supervision of other high risk pregnancies, unspecified trimester: Secondary | ICD-10-CM | POA: Insufficient documentation

## 2023-04-17 LAB — CYTOLOGY - PAP
Chlamydia: NEGATIVE
Comment: NEGATIVE
Comment: NEGATIVE
Comment: NORMAL
Diagnosis: NEGATIVE
High risk HPV: NEGATIVE
Neisseria Gonorrhea: NEGATIVE

## 2023-04-17 LAB — URINE CULTURE

## 2023-04-24 ENCOUNTER — Telehealth: Payer: Self-pay

## 2023-04-24 LAB — PANORAMA PRENATAL TEST FULL PANEL:PANORAMA TEST PLUS 5 ADDITIONAL MICRODELETIONS: FETAL FRACTION: 6.6

## 2023-04-24 NOTE — Telephone Encounter (Signed)
Patient would like for someone to call her about her test results 

## 2023-04-25 LAB — HORIZON CUSTOM: REPORT SUMMARY: NEGATIVE

## 2023-04-25 NOTE — Telephone Encounter (Signed)
 Returned patient's call. All questions answered regarding labs.  No further questions.

## 2023-04-28 ENCOUNTER — Encounter: Payer: Self-pay | Admitting: Advanced Practice Midwife

## 2023-05-08 ENCOUNTER — Ambulatory Visit: Payer: Managed Care, Other (non HMO) | Admitting: Women's Health

## 2023-05-08 ENCOUNTER — Encounter: Payer: Self-pay | Admitting: Women's Health

## 2023-05-08 VITALS — BP 109/74 | HR 84 | Wt 196.8 lb

## 2023-05-08 DIAGNOSIS — Z1379 Encounter for other screening for genetic and chromosomal anomalies: Secondary | ICD-10-CM | POA: Diagnosis not present

## 2023-05-08 DIAGNOSIS — Z3480 Encounter for supervision of other normal pregnancy, unspecified trimester: Secondary | ICD-10-CM

## 2023-05-08 DIAGNOSIS — R5383 Other fatigue: Secondary | ICD-10-CM

## 2023-05-08 DIAGNOSIS — Z363 Encounter for antenatal screening for malformations: Secondary | ICD-10-CM

## 2023-05-08 DIAGNOSIS — Z3A17 17 weeks gestation of pregnancy: Secondary | ICD-10-CM

## 2023-05-08 DIAGNOSIS — Z3482 Encounter for supervision of other normal pregnancy, second trimester: Secondary | ICD-10-CM | POA: Diagnosis not present

## 2023-05-08 NOTE — Progress Notes (Signed)
 LOW-RISK PREGNANCY VISIT Patient name: Courtney Spencer MRN [id number]  Date of birth: 1995/07/05 Chief Complaint:   Routine Prenatal Visit (2nd IT)  History of Present Illness:   Courtney Spencer is a 28 y.o. G2P0010 female at [redacted]w[redacted]d with an Estimated Date of Delivery: 10/15/23 being seen today for ongoing management of a low-risk pregnancy.   Today she reports  fatigue-requests TSH, sinus/allergy sx, headache, nausea, sciatica . Contractions: Not present.  .  Movement: Absent. denies leaking of fluid.     04/14/2023    3:51 PM 01/14/2022    3:32 PM 06/08/2021    1:13 PM 12/29/2020    2:57 PM 12/11/2020    8:42 AM  Depression screen PHQ 2/9  Decreased Interest 1 1 0 0 0  Down, Depressed, Hopeless 0 1 0 0 0  PHQ - 2 Score 1 2 0 0 0  Altered sleeping 0 1 1 0   Tired, decreased energy 2 1 2  0   Change in appetite 0 0 1 0   Feeling bad or failure about yourself  0 1 0 0   Trouble concentrating 0 0 0 0   Moving slowly or fidgety/restless 0 0 0 0   Suicidal thoughts 0 0 0 0   PHQ-9 Score 3 5 4  0   Difficult doing work/chores   Not difficult at all Not difficult at all         04/14/2023    3:51 PM 01/14/2022    3:32 PM 12/10/2019    1:04 PM  GAD 7 : Generalized Anxiety Score  Nervous, Anxious, on Edge 1 1 1   Control/stop worrying 0 1 1  Worry too much - different things 0 1 0  Trouble relaxing 0 1 0  Restless 0 0 0  Easily annoyed or irritable 0 0 0  Afraid - awful might happen 0 0 2  Total GAD 7 Score 1 4 4       Review of Systems:   Pertinent items are noted in HPI Denies abnormal vaginal discharge w/ itching/odor/irritation, headaches, visual changes, shortness of breath, chest pain, abdominal pain, severe nausea/vomiting, or problems with urination or bowel movements unless otherwise stated above. Pertinent History Reviewed:  Reviewed past medical,surgical, social, obstetrical and family history.  Reviewed problem list, medications and  allergies. Physical Assessment:   Vitals:   05/08/23 1601  BP: 109/74  Pulse: 84  Weight: 196 lb 12.8 oz (89.3 kg)  Body mass index is 39.75 kg/m.        Physical Examination:   General appearance: Well appearing, and in no distress  Mental status: Alert, oriented to person, place, and time  Skin: Warm & dry  Cardiovascular: Normal heart rate noted  Respiratory: Normal respiratory effort, no distress  Abdomen: Soft, gravid, nontender  Pelvic: Cervical exam deferred         Extremities:    Fetal Status: Fetal Heart Rate (bpm): 148   Movement: Absent    Chaperone: N/A No results found for this or any previous visit (from the past 24 hours).  Assessment & Plan:  1) Low-risk pregnancy G2P0010 at [redacted]w[redacted]d with an Estimated Date of Delivery: 10/15/23   2) Fatigue, TSH per pt request  3) Sinus/allergy sx> ok for otc meds  4) Headaches> apap prn  5) Constipation> gave printed prevention/relief measures   6) Nausea> has phenergan for prn  7) Sciatica> gave printed exercises   Meds: No orders of the defined types were placed  in this encounter.  Labs/procedures today: 2nd IT and TSH  Plan:  Continue routine obstetrical care  Next visit: prefers will be in person for u/s     Reviewed: Preterm labor symptoms and general obstetric precautions including but not limited to vaginal bleeding, contractions, leaking of fluid and fetal movement were reviewed in detail with the patient.  All questions were answered. Does have home bp cuff. Office bp cuff given: not applicable. Check bp weekly, let us know if consistently >140 and/or >90.  Follow-up: Return for As scheduled.  Future Appointments  Date Time Provider Department Center  05/29/2023  8:30 AM [facility] [place] [facility] None  05/29/2023  9:30 AM Cheral Marker, CNM [facility]  08/11/2023  3:30 PM Aida Raider, NP [facility]    Orders Placed This Encounter  Procedures   INTEGRATED 2   TSH   Cheral Marker CNM,  [facility] 05/08/2023 4:19 PM

## 2023-05-08 NOTE — Patient Instructions (Addendum)
 Courtney Spencer, thank you for choosing our office today! We appreciate the opportunity to meet your healthcare needs. You may receive a short survey by mail, e-mail, or through [employer]. If you are happy with your care we would appreciate if you could take just a few minutes to complete the survey questions. We read all of your comments and take your feedback very seriously. Thank you again for choosing our office.  Center for [employer] Team at [facility] at [facility] ([address] [place], [place] [address]) Entrance C, located off of [address] 24/7 valet parking   Constipation Drink plenty of fluid, preferably water, throughout the day Eat foods high in fiber such as fruits, vegetables, and grains Exercise, such as walking, is a good way to keep your bowels regular Drink warm fluids, especially warm prune juice, or decaf coffee Eat a 1/2 cup of real oatmeal (not instant), 1/2 cup applesauce, and 1/2-1 cup warm prune juice every day If needed, you may take Colace (docusate sodium) stool softener once or twice a day to help keep the stool soft. If you are pregnant, wait until you are out of your first trimester (12-14 weeks of pregnancy) If you still are having problems with constipation, you may take Miralax once daily as needed to help keep your bowels regular.  If you are pregnant, wait until you are out of your first trimester (12-14 weeks of pregnancy)   Go to [email or web address] to register for FREE online childbirth classes  Call the office [phone]) or go to [facility] if: You begin to severe cramping Your water breaks.  Sometimes it is a big gush of fluid, sometimes it is just a trickle that keeps getting your panties wet or running down your legs You have vaginal bleeding.  It is normal to have a small amount of spotting if your cervix was checked.   [facility] Pediatricians/Family Doctors [facility]): [address]  Dr. Colette Ribas, [phone]           [facility]: [address], [phone]                [facility]): [address], [phone] (call to ask if accepting patients) [facility] Department: [address], [place], [phone]    [facility] Pediatricians/Family Doctors [facility]): [phone], [phone] [facility]: [address] [place], [phone] [facility]: [address], [phone]  [facility] Doctors  [facility]): [phone] [facility]: [address] [phone]   [facility] Doctors [facility]: [address], [phone]  [facility] Doctors  [facility]: [phone], [phone]  Home Blood Pressure Monitoring for Patients   Your provider has recommended that you check your blood pressure (BP) at least once a week at home. If you do not have a blood pressure cuff at home, one will be provided for you. Contact your provider if you have not received your monitor within 1 week.   Helpful Tips for Accurate Home Blood Pressure Checks  Don't smoke, exercise, or drink caffeine 30 minutes before checking your BP Use the restroom before checking your BP (a full bladder can raise your pressure) Relax in a comfortable upright chair Feet on the ground Left arm resting comfortably on a flat surface at the level of your  heart Legs uncrossed Back supported Sit quietly and don't talk Place the cuff on your bare arm Adjust snuggly, so that only two fingertips can fit between your skin and the top of the cuff Check 2 readings separated by at least one minute Keep a log of your BP readings For a visual, please reference this diagram: http://ccnc.care/bpdiagram  Provider Name: [facility]     Phone: [phone]  Zone 1: ALL CLEAR   Continue to monitor your symptoms:  BP reading is less than 140 (top number) or less than 90 (bottom number)  No right upper stomach pain No headaches or seeing spots No feeling nauseated or throwing up No swelling in face and hands  Zone 2: CAUTION Call your doctor's office for any of the following:  BP reading is greater than 140 (top number) or greater than 90 (bottom number)  Stomach pain under your ribs in the middle or right side Headaches or seeing spots Feeling nauseated or throwing up Swelling in face and hands  Zone 3: EMERGENCY  Seek immediate medical care if you have any of the following:  BP reading is greater than160 (top number) or greater than 110 (bottom number) Severe headaches not improving with Tylenol Serious difficulty catching your breath Any worsening symptoms from Zone 2     Second Trimester of Pregnancy The second trimester is from week 14 through week 27 (months 4 through 6). The second trimester is often a time when you feel your best. Your body has adjusted to being pregnant, and you begin to feel better physically. Usually, morning sickness has lessened or quit completely, you may have more energy, and you may have an increase in appetite. The second trimester is also a time when the fetus is growing rapidly. At the end of the sixth month, the fetus is about 9 inches long and weighs about 1 pounds. You will likely begin to feel the baby move (quickening) between 16 and 20 weeks of pregnancy. Body changes during your second trimester Your body continues to go through many changes during your second trimester. The changes vary from woman to woman. Your weight will continue to increase. You will notice your lower abdomen bulging out. You may begin to get stretch marks on your hips, abdomen, and breasts. You may develop headaches that can be relieved by medicines. The medicines should be approved by your health care provider. You may urinate more often  because the fetus is pressing on your bladder. You may develop or continue to have heartburn as a result of your pregnancy. You may develop constipation because certain hormones are causing the muscles that push waste through your intestines to slow down. You may develop hemorrhoids or swollen, bulging veins (varicose veins). You may have back pain. This is caused by: Weight gain. Pregnancy hormones that are relaxing the joints in your pelvis. A shift in weight and the muscles that support your balance. Your breasts will continue to grow and they will continue to become tender. Your gums may bleed and may be sensitive to brushing and flossing. Dark spots or blotches (chloasma, mask of pregnancy) may develop on your face. This will likely fade after the baby is born. A dark line from your belly button to the pubic area (linea nigra) may appear. This will likely fade after the baby is born. You may have changes in your hair. These can include thickening of your hair, rapid growth, and changes in texture. Some women also have hair loss during  or after pregnancy, or hair that feels dry or thin. Your hair will most likely return to normal after your baby is born.  What to expect at prenatal visits During a routine prenatal visit: You will be weighed to make sure you and the fetus are growing normally. Your blood pressure will be taken. Your abdomen will be measured to track your baby's growth. The fetal heartbeat will be listened to. Any test results from the previous visit will be discussed.  Your health care provider may ask you: How you are feeling. If you are feeling the baby move. If you have had any abnormal symptoms, such as leaking fluid, bleeding, severe headaches, or abdominal cramping. If you are using any tobacco products, including cigarettes, chewing tobacco, and electronic cigarettes. If you have any questions.  Other tests that may be performed during your second trimester  include: Blood tests that check for: Low iron levels (anemia). High blood sugar that affects pregnant women (gestational diabetes) between 85 and 28 weeks. Rh antibodies. This is to check for a protein on red blood cells (Rh factor). Urine tests to check for infections, diabetes, or protein in the urine. An ultrasound to confirm the proper growth and development of the baby. An amniocentesis to check for possible genetic problems. Fetal screens for spina bifida and Down syndrome. HIV (human immunodeficiency virus) testing. Routine prenatal testing includes screening for HIV, unless you choose not to have this test.  Follow these instructions at home: Medicines Follow your health care provider's instructions regarding medicine use. Specific medicines may be either safe or unsafe to take during pregnancy. Take a prenatal vitamin that contains at least 600 micrograms (mcg) of folic acid. If you develop constipation, try taking a stool softener if your health care provider approves. Eating and drinking Eat a balanced diet that includes fresh fruits and vegetables, whole grains, good sources of protein such as meat, eggs, or tofu, and low-fat dairy. Your health care provider will help you determine the amount of weight gain that is right for you. Avoid raw meat and uncooked cheese. These carry germs that can cause birth defects in the baby. If you have low calcium intake from food, talk to your health care provider about whether you should take a daily calcium supplement. Limit foods that are high in fat and processed sugars, such as fried and sweet foods. To prevent constipation: Drink enough fluid to keep your urine clear or pale yellow. Eat foods that are high in fiber, such as fresh fruits and vegetables, whole grains, and beans. Activity Exercise only as directed by your health care provider. Most women can continue their usual exercise routine during pregnancy. Try to exercise for 30  minutes at least 5 days a week. Stop exercising if you experience uterine contractions. Avoid heavy lifting, wear low heel shoes, and practice good posture. A sexual relationship may be continued unless your health care provider directs you otherwise. Relieving pain and discomfort Wear a good support bra to prevent discomfort from breast tenderness. Take warm sitz baths to soothe any pain or discomfort caused by hemorrhoids. Use hemorrhoid cream if your health care provider approves. Rest with your legs elevated if you have leg cramps or low back pain. If you develop varicose veins, wear support hose. Elevate your feet for 15 minutes, 3-4 times a day. Limit salt in your diet. Prenatal Care Write down your questions. Take them to your prenatal visits. Keep all your prenatal visits as told by your health care  provider. This is important. Safety Wear your seat belt at all times when driving. Make a list of emergency phone numbers, including numbers for family, friends, the hospital, and police and fire departments. General instructions Ask your health care provider for a referral to a local prenatal education class. Begin classes no later than the beginning of month 6 of your pregnancy. Ask for help if you have counseling or nutritional needs during pregnancy. Your health care provider can offer advice or refer you to specialists for help with various needs. Do not use hot tubs, steam rooms, or saunas. Do not douche or use tampons or scented sanitary pads. Do not cross your legs for long periods of time. Avoid cat litter boxes and soil used by cats. These carry germs that can cause birth defects in the baby and possibly loss of the fetus by miscarriage or stillbirth. Avoid all smoking, herbs, alcohol, and unprescribed drugs. Chemicals in these products can affect the formation and growth of the baby. Do not use any products that contain nicotine or tobacco, such as cigarettes and e-cigarettes. If  you need help quitting, ask your health care provider. Visit your dentist if you have not gone yet during your pregnancy. Use a soft toothbrush to brush your teeth and be gentle when you floss. Contact a health care provider if: You have dizziness. You have mild pelvic cramps, pelvic pressure, or nagging pain in the abdominal area. You have persistent nausea, vomiting, or diarrhea. You have a bad smelling vaginal discharge. You have pain when you urinate. Get help right away if: You have a fever. You are leaking fluid from your vagina. You have spotting or bleeding from your vagina. You have severe abdominal cramping or pain. You have rapid weight gain or weight loss. You have shortness of breath with chest pain. You notice sudden or extreme swelling of your face, hands, ankles, feet, or legs. You have not felt your baby move in over an hour. You have severe headaches that do not go away when you take medicine. You have vision changes. Summary The second trimester is from week 14 through week 27 (months 4 through 6). It is also a time when the fetus is growing rapidly. Your body goes through many changes during pregnancy. The changes vary from woman to woman. Avoid all smoking, herbs, alcohol, and unprescribed drugs. These chemicals affect the formation and growth your baby. Do not use any tobacco products, such as cigarettes, chewing tobacco, and e-cigarettes. If you need help quitting, ask your health care provider. Contact your health care provider if you have any questions. Keep all prenatal visits as told by your health care provider. This is important. This information is not intended to replace advice given to you by your health care provider. Make sure you discuss any questions you have with your health care provider. Document Released: 02/05/2001 Document Revised: 07/20/2015 Document Reviewed: 04/14/2012 Elsevier Interactive Patient Education  2017 [employer]  Sciatica  Rehab Ask your health care provider which exercises are safe for you. Do exercises exactly as told by your health care provider and adjust them as directed. It is normal to feel mild stretching, pulling, tightness, or discomfort as you do these exercises. Stop right away if you feel sudden pain or your pain gets worse. Do not begin these exercises until told by your health care provider. Stretching and range-of-motion exercises These exercises warm up your muscles and joints and improve the movement and flexibility of your hips and back.  These exercises also help to relieve pain, numbness, and tingling. Sciatic nerve glide  Sit in a chair with your head facing down toward your chest. Place your hands behind your back. Let your shoulders slump forward. Slowly straighten one of your legs while you tilt your head back as if you are looking toward the ceiling. Only straighten your leg as far as you can without making your symptoms worse. Hold this position for __________ seconds. Slowly return your leg and head back to the starting position. Repeat with your other leg. Repeat __________ times. Complete this exercise __________ times a day. Knee to chest with hip adduction and internal rotation  Lie on your back on a firm surface with both legs straight. Bend one of your knees and move it up toward your chest until you feel a gentle stretch in your lower back and buttock. Then, move your knee toward the shoulder that is on the opposite side from your leg. This is hip adduction and internal rotation. Hold your leg in this position by holding on to the front of your knee. Hold this position for __________ seconds. Slowly return to the starting position. Repeat with your other leg. Repeat __________ times. Complete this exercise __________ times a day. Prone extension on elbows  Lie on your abdomen on a firm surface. A bed may be too soft for this exercise. Prop yourself up on your elbows. Use your  arms to help lift your chest up until you feel a gentle stretch in your abdomen and your lower back. This will place some of your body weight on your elbows. If this is uncomfortable, try stacking pillows under your chest. Your hips should stay down, against the surface that you are lying on. Keep your hip and back muscles relaxed. Hold this position for __________ seconds. Slowly relax your upper body and return to the starting position. Repeat __________ times. Complete this exercise __________ times a day. Strengthening exercises These exercises build strength and endurance in your back. Endurance is the ability to use your muscles for a long time, even after they get tired. Pelvic tilt This exercise strengthens the muscles that lie deep in the abdomen. Lie on your back on a firm surface. Bend your knees and keep your feet flat on the surface. Tense your abdominal muscles. Tip your pelvis up toward the ceiling and flatten your lower back into the firm surface. To help with this exercise, you may place a small towel under your lower back and try to push your back into the towel. Hold this position for __________ seconds. Let your muscles relax completely before you repeat this exercise. Repeat __________ times. Complete this exercise __________ times a day. Alternating arm and leg raises  Get on your hands and knees on a firm surface. If you are on a hard floor, you may want to use padding, such as an exercise mat, to cushion your knees. Line up your arms and legs. Your hands should be directly below your shoulders, and your knees should be directly below your hips. Lift your left leg behind you. At the same time, raise your right arm and straighten it in front of you. Do not lift your leg higher than your hip. Do not lift your arm higher than your shoulder. Keep your abdominal and back muscles tight. Keep your hips facing the ground. Do not arch your back. Keep your balance carefully,  and do not hold your breath. Hold this position for __________ seconds. Slowly return  to the starting position. Repeat with your right leg and your left arm. Repeat __________ times. Complete this exercise __________ times a day. Posture and body mechanics Good posture and healthy body mechanics can help to relieve stress in your body's tissues and joints. Body mechanics refers to the movements and positions of your body while you do your daily activities. Posture is part of body mechanics. Good posture means: Your spine is in its natural S-curve position (neutral). Your shoulders are pulled back slightly. Your head is not tipped forward. Follow these guidelines to improve your posture and body mechanics in your everyday activities. Standing  When standing, keep your spine neutral and your feet about hip width apart. Keep a slight bend in your knees. Your ears, shoulders, and hips should line up. When you do a task in which you stand in one place for a long time, place one foot up on a stable object that is 2-4 inches (5-10 cm) high, such as a footstool. This helps keep your spine neutral. Sitting  When sitting, keep your spine neutral and keep your feet flat on the floor. Use a footrest, if necessary, and keep your thighs parallel to the floor. Avoid rounding your shoulders, and avoid tilting your head forward. When working at a desk or a computer, keep your desk at a height where your hands are slightly lower than your elbows. Slide your chair under your desk so you are close enough to maintain good posture. When working at a computer, place your monitor at a height where you are looking straight ahead and you do not have to tilt your head forward or downward to look at the screen. Resting  When lying down and resting, avoid positions that are most painful for you. If you have pain with activities such as sitting, bending, stooping, or squatting, lie in a position in which your body does not  bend very much. For example, avoid curling up on your side with your arms and knees near your chest (fetal position). If you have pain with activities such as standing for a long time or reaching with your arms, lie with your spine in a neutral position and bend your knees slightly. Try the following positions: Lying on your side with a pillow between your knees. Lying on your back with a pillow under your knees. Lifting  When lifting objects, keep your feet at least shoulder width apart and tighten your abdominal muscles. Bend your knees and hips and keep your spine neutral. It is important to lift using the strength of your legs, not your back. Do not lock your knees straight out. Always ask for help to lift heavy or awkward objects. This information is not intended to replace advice given to you by your health care provider. Make sure you discuss any questions you have with your health care provider. Document Revised: 05/22/2021 Document Reviewed: 05/22/2021 Elsevier Patient Education  2024 [employer].

## 2023-05-09 LAB — TSH: TSH: 2.09 u[IU]/mL (ref 0.450–4.500)

## 2023-05-11 LAB — INTEGRATED 2
AFP MoM: 1.92
Alpha-Fetoprotein: 53.2 ng/mL
Crown Rump Length: 72.6 mm
DIA MoM: 1.28
DIA Value: 173.4 pg/mL
Estriol, Unconjugated: 1.1 ng/mL
Gest. Age on Collection Date: 13.1 wk
Gestational Age: 16.6 wk
Maternal Age at EDD: 29.2 a
Nuchal Translucency (NT): 1.9 mm
Nuchal Translucency MoM: 0.93
Number of Fetuses: 1
PAPP-A MoM: 0.58
PAPP-A Value: 532.4 ng/mL
Sonographer ID#: 309760
Weight: 193 [lb_av]
Weight: 196 [lb_av]
hCG MoM: 1.56
hCG Value: 43.2 [IU]/mL
uE3 MoM: 1.02

## 2023-05-14 ENCOUNTER — Encounter: Payer: Self-pay | Admitting: Women's Health

## 2023-05-15 ENCOUNTER — Ambulatory Visit: Admitting: *Deleted

## 2023-05-15 VITALS — BP 111/74 | HR 93

## 2023-05-15 DIAGNOSIS — Z3482 Encounter for supervision of other normal pregnancy, second trimester: Secondary | ICD-10-CM

## 2023-05-15 DIAGNOSIS — Z3A18 18 weeks gestation of pregnancy: Secondary | ICD-10-CM

## 2023-05-15 DIAGNOSIS — Z3480 Encounter for supervision of other normal pregnancy, unspecified trimester: Secondary | ICD-10-CM

## 2023-05-15 NOTE — Progress Notes (Signed)
   NURSE VISIT- Fetal Heart Rate Check  SUBJECTIVE:  Courtney Spencer is a 28 y.o. G2P0010 female at [redacted]w[redacted]d, here for a fetal heart rate check for concerns for not feeling any fetal movement and not having pregnancy symptoms.    OBJECTIVE:  BP 111/74   Pulse 93   LMP 01/08/2023 (Approximate)   Appears well, no apparent distress  FHR 147 via doppler   ASSESSMENT: G2P0010 at [redacted]w[redacted]d with present111 fetal heart rate  PLAN: Follow-up as scheduled  Jobe Marker  05/15/2023 4:37 PM

## 2023-05-29 ENCOUNTER — Ambulatory Visit: Payer: Managed Care, Other (non HMO) | Admitting: Women's Health

## 2023-05-29 ENCOUNTER — Encounter: Payer: Self-pay | Admitting: Women's Health

## 2023-05-29 ENCOUNTER — Ambulatory Visit: Payer: Managed Care, Other (non HMO)

## 2023-05-29 VITALS — BP 105/73 | HR 73 | Wt 200.0 lb

## 2023-05-29 DIAGNOSIS — Z3A20 20 weeks gestation of pregnancy: Secondary | ICD-10-CM | POA: Diagnosis not present

## 2023-05-29 DIAGNOSIS — Z362 Encounter for other antenatal screening follow-up: Secondary | ICD-10-CM

## 2023-05-29 DIAGNOSIS — Z363 Encounter for antenatal screening for malformations: Secondary | ICD-10-CM

## 2023-05-29 DIAGNOSIS — Z3482 Encounter for supervision of other normal pregnancy, second trimester: Secondary | ICD-10-CM | POA: Diagnosis not present

## 2023-05-29 DIAGNOSIS — Z3480 Encounter for supervision of other normal pregnancy, unspecified trimester: Secondary | ICD-10-CM

## 2023-05-29 NOTE — Progress Notes (Signed)
 [place] 20+1 wks,breech,anterior placenta gr 0,normal ovaries,CX 3.6 cm,SVP of fluid 5 cm,FHR 148 bpm,EFW 299 g 18%,limited view of heart because of fetal position,please have pt come back for additional images,no obvious abnormalities

## 2023-05-29 NOTE — Patient Instructions (Signed)
Mckaela, thank you for choosing our office today! We appreciate the opportunity to meet your healthcare needs. You may receive a short survey by mail, e-mail, or through MyChart. If you are happy with your care we would appreciate if you could take just a few minutes to complete the survey questions. We read all of your comments and take your feedback very seriously. Thank you again for choosing our office.  °[facility] °[facility] °([address] [place], [place] [address]) °Entrance C, located off of [address] °Free 24/7 valet parking  °Go to [email or web address] to register for FREE online childbirth classes ° °Call the office ([phone]) or go to [facility] if: °You begin to severe cramping °Your water breaks.  Sometimes it is a big gush of fluid, sometimes it is just a trickle that keeps getting your panties wet or running down your legs °You have vaginal bleeding.  It is normal to have a small amount of spotting if your cervix was checked.  ° °[place] [facility] °[facility] ([facility]): [address], [phone]           °[facility]: [address], [phone]                °[facility] ([facility]): [address], [phone] (call to ask if accepting patients) °[facility]: [address], [place], [phone]   ° °[place] [facility]/Family Doctors °[facility] ([facility]): [address], [phone] °[facility]: [address], [phone] °[facility]: [address], [phone] ° °[place] [facility]  °[facility] ([facility]): [phone] °[facility]: [address], [phone]  ° °[place] [facility] °[facility]: [address], [phone] ° °[facility]  °[facility]: [address], [phone] ° °Home Blood Pressure Monitoring for Patients  ° °Your provider has recommended that you check your blood pressure (BP) at least once a week at home. If you do not have a blood pressure cuff at home, one will be provided for you. Contact your provider if you have not received your monitor within 1 week.  ° °Helpful Tips for Accurate Home Blood Pressure Checks  °Don't smoke, exercise, or drink caffeine 30 minutes before checking your BP °Use the restroom before checking your BP (a full bladder can raise your pressure) °Relax in a comfortable upright chair °Feet on the ground °Left arm resting comfortably on a flat surface at the level of your heart °Legs uncrossed °Back supported °Sit quietly and don't talk °Place the cuff on your bare arm °Adjust snuggly, so that only two fingertips can fit between your skin and the top of the cuff °Check 2 readings separated by at least one minute °Keep a log of your BP readings °For a visual, please reference this diagram: http://ccnc.care/bpdiagram ° °Provider Name: [facility]     Phone: [phone] ° °Zone 1: ALL CLEAR  °Continue to monitor your symptoms:  °BP reading is less than 140 (top number) or less than 90 (bottom number)  °No right upper stomach pain °No headaches or seeing spots °No feeling nauseated or throwing up °No swelling in face and hands ° °Zone 2: CAUTION °Call your doctor's office for any of the following:  °BP reading is greater than 140 (top number) or greater than   90 (bottom number)  Stomach pain under your ribs in the middle or right side Headaches or seeing spots Feeling nauseated or throwing up Swelling in face and hands  Zone 3: EMERGENCY  Seek immediate medical care if you have any of the following:  BP reading is greater than160 (top number) or greater than 110 (bottom number) Severe headaches not improving with Tylenol Serious difficulty catching your breath Any worsening symptoms from  Zone 2     Second Trimester of Pregnancy The second trimester is from week 14 through week 27 (months 4 through 6). The second trimester is often a time when you feel your best. Your body has adjusted to being pregnant, and you begin to feel better physically. Usually, morning sickness has lessened or quit completely, you may have more energy, and you may have an increase in appetite. The second trimester is also a time when the fetus is growing rapidly. At the end of the sixth month, the fetus is about 9 inches long and weighs about 1 pounds. You will likely begin to feel the baby move (quickening) between 16 and 20 weeks of pregnancy. Body changes during your second trimester Your body continues to go through many changes during your second trimester. The changes vary from woman to woman. Your weight will continue to increase. You will notice your lower abdomen bulging out. You may begin to get stretch marks on your hips, abdomen, and breasts. You may develop headaches that can be relieved by medicines. The medicines should be approved by your health care provider. You may urinate more often because the fetus is pressing on your bladder. You may develop or continue to have heartburn as a result of your pregnancy. You may develop constipation because certain hormones are causing the muscles that push waste through your intestines to slow down. You may develop hemorrhoids or swollen, bulging veins (varicose veins). You may have back pain. This is caused by: Weight gain. Pregnancy hormones that are relaxing the joints in your pelvis. A shift in weight and the muscles that support your balance. Your breasts will continue to grow and they will continue to become tender. Your gums may bleed and may be sensitive to brushing and flossing. Dark spots or blotches (chloasma, mask of pregnancy) may develop on your face. This will likely fade after the baby is born. A dark line from your belly button to  the pubic area (linea nigra) may appear. This will likely fade after the baby is born. You may have changes in your hair. These can include thickening of your hair, rapid growth, and changes in texture. Some women also have hair loss during or after pregnancy, or hair that feels dry or thin. Your hair will most likely return to normal after your baby is born.  What to expect at prenatal visits During a routine prenatal visit: You will be weighed to make sure you and the fetus are growing normally. Your blood pressure will be taken. Your abdomen will be measured to track your baby's growth. The fetal heartbeat will be listened to. Any test results from the previous visit will be discussed.  Your health care provider may ask you: How you are feeling. If you are feeling the baby move. If you have had any abnormal symptoms, such as leaking fluid, bleeding, severe headaches, or abdominal cramping. If you are using any tobacco products, including cigarettes, chewing tobacco, and electronic cigarettes. If you have any questions.  Other tests that may be performed during   your second trimester include: Blood tests that check for: Low iron levels (anemia). High blood sugar that affects pregnant women (gestational diabetes) between 24 and 28 weeks. Rh antibodies. This is to check for a protein on red blood cells (Rh factor). Urine tests to check for infections, diabetes, or protein in the urine. An ultrasound to confirm the proper growth and development of the baby. An amniocentesis to check for possible genetic problems. Fetal screens for spina bifida and Down syndrome. HIV (human immunodeficiency virus) testing. Routine prenatal testing includes screening for HIV, unless you choose not to have this test.  Follow these instructions at home: Medicines Follow your health care provider's instructions regarding medicine use. Specific medicines may be either safe or unsafe to take during  pregnancy. Take a prenatal vitamin that contains at least 600 micrograms (mcg) of folic acid. If you develop constipation, try taking a stool softener if your health care provider approves. Eating and drinking Eat a balanced diet that includes fresh fruits and vegetables, whole grains, good sources of protein such as meat, eggs, or tofu, and low-fat dairy. Your health care provider will help you determine the amount of weight gain that is right for you. Avoid raw meat and uncooked cheese. These carry germs that can cause birth defects in the baby. If you have low calcium intake from food, talk to your health care provider about whether you should take a daily calcium supplement. Limit foods that are high in fat and processed sugars, such as fried and sweet foods. To prevent constipation: Drink enough fluid to keep your urine clear or pale yellow. Eat foods that are high in fiber, such as fresh fruits and vegetables, whole grains, and beans. Activity Exercise only as directed by your health care provider. Most women can continue their usual exercise routine during pregnancy. Try to exercise for 30 minutes at least 5 days a week. Stop exercising if you experience uterine contractions. Avoid heavy lifting, wear low heel shoes, and practice good posture. A sexual relationship may be continued unless your health care provider directs you otherwise. Relieving pain and discomfort Wear a good support bra to prevent discomfort from breast tenderness. Take warm sitz baths to soothe any pain or discomfort caused by hemorrhoids. Use hemorrhoid cream if your health care provider approves. Rest with your legs elevated if you have leg cramps or low back pain. If you develop varicose veins, wear support hose. Elevate your feet for 15 minutes, 3-4 times a day. Limit salt in your diet. Prenatal Care Write down your questions. Take them to your prenatal visits. Keep all your prenatal visits as told by your health  care provider. This is important. Safety Wear your seat belt at all times when driving. Make a list of emergency phone numbers, including numbers for family, friends, the hospital, and police and fire departments. General instructions Ask your health care provider for a referral to a local prenatal education class. Begin classes no later than the beginning of month 6 of your pregnancy. Ask for help if you have counseling or nutritional needs during pregnancy. Your health care provider can offer advice or refer you to specialists for help with various needs. Do not use hot tubs, steam rooms, or saunas. Do not douche or use tampons or scented sanitary pads. Do not cross your legs for long periods of time. Avoid cat litter boxes and soil used by cats. These carry germs that can cause birth defects in the baby and possibly loss of the   fetus by miscarriage or stillbirth. Avoid all smoking, herbs, alcohol, and unprescribed drugs. Chemicals in these products can affect the formation and growth of the baby. Do not use any products that contain nicotine or tobacco, such as cigarettes and e-cigarettes. If you need help quitting, ask your health care provider. Visit your dentist if you have not gone yet during your pregnancy. Use a soft toothbrush to brush your teeth and be gentle when you floss. Contact a health care provider if: You have dizziness. You have mild pelvic cramps, pelvic pressure, or nagging pain in the abdominal area. You have persistent nausea, vomiting, or diarrhea. You have a bad smelling vaginal discharge. You have pain when you urinate. Get help right away if: You have a fever. You are leaking fluid from your vagina. You have spotting or bleeding from your vagina. You have severe abdominal cramping or pain. You have rapid weight gain or weight loss. You have shortness of breath with chest pain. You notice sudden or extreme swelling of your face, hands, ankles, feet, or legs. You  have not felt your baby move in over an hour. You have severe headaches that do not go away when you take medicine. You have vision changes. Summary The second trimester is from week 14 through week 27 (months 4 through 6). It is also a time when the fetus is growing rapidly. Your body goes through many changes during pregnancy. The changes vary from woman to woman. Avoid all smoking, herbs, alcohol, and unprescribed drugs. These chemicals affect the formation and growth your baby. Do not use any tobacco products, such as cigarettes, chewing tobacco, and e-cigarettes. If you need help quitting, ask your health care provider. Contact your health care provider if you have any questions. Keep all prenatal visits as told by your health care provider. This is important. This information is not intended to replace advice given to you by your health care provider. Make sure you discuss any questions you have with your health care provider. Document Released: 02/05/2001 Document Revised: 07/20/2015 Document Reviewed: 04/14/2012 Elsevier Interactive Patient Education  2017 Elsevier Inc.  

## 2023-05-29 NOTE — Progress Notes (Signed)
 LOW-RISK PREGNANCY VISIT Patient name: Courtney Spencer MRN [id number]  Date of birth: [birth year] Chief Complaint:   Routine Prenatal Visit (Anatomy scan)  History of Present Illness:   Courtney Spencer is a 28 y.o. G2P0010 female at [redacted]w[redacted]d with an Estimated Date of Delivery: 10/15/23 being seen today for ongoing management of a low-risk pregnancy.   Today she reports  sciatica . Contractions: Not present. Vag. Bleeding: None.  Movement: Present. denies leaking of fluid.     04/14/2023    3:51 PM 01/14/2022    3:32 PM 06/08/2021    1:13 PM 12/29/2020    2:57 PM 12/11/2020    8:42 AM  Depression screen PHQ 2/9  Decreased Interest 1 1 0 0 0  Down, Depressed, Hopeless 0 1 0 0 0  PHQ - 2 Score 1 2 0 0 0  Altered sleeping 0 1 1 0   Tired, decreased energy 2 1 2  0   Change in appetite 0 0 1 0   Feeling bad or failure about yourself  0 1 0 0   Trouble concentrating 0 0 0 0   Moving slowly or fidgety/restless 0 0 0 0   Suicidal thoughts 0 0 0 0   PHQ-9 Score 3 5 4  0   Difficult doing work/chores   Not difficult at all Not difficult at all         04/14/2023    3:51 PM 01/14/2022    3:32 PM 12/10/2019    1:04 PM  GAD 7 : Generalized Anxiety Score  Nervous, Anxious, on Edge 1 1 1   Control/stop worrying 0 1 1  Worry too much - different things 0 1 0  Trouble relaxing 0 1 0  Restless 0 0 0  Easily annoyed or irritable 0 0 0  Afraid - awful might happen 0 0 2  Total GAD 7 Score 1 4 4       Review of Systems:   Pertinent items are noted in HPI Denies abnormal vaginal discharge w/ itching/odor/irritation, headaches, visual changes, shortness of breath, chest pain, abdominal pain, severe nausea/vomiting, or problems with urination or bowel movements unless otherwise stated above. Pertinent History Reviewed:  Reviewed past medical,surgical, social, obstetrical and family history.  Reviewed problem list, medications and allergies. Physical Assessment:   Vitals:    05/29/23 0924  BP: 105/73  Pulse: 73  Weight: 200 lb (90.7 kg)  Body mass index is 40.4 kg/m.        Physical Examination:   General appearance: Well appearing, and in no distress  Mental status: Alert, oriented to person, place, and time  Skin: Warm & dry  Cardiovascular: Normal heart rate noted  Respiratory: Normal respiratory effort, no distress  Abdomen: Soft, gravid, nontender  Pelvic: Cervical exam deferred         Extremities: Edema: None  Fetal Status:     Movement: Present  [place] 20+1 wks,breech,anterior placenta gr 0,normal ovaries,CX 3.6 cm,SVP of fluid 5 cm,FHR 148 bpm,EFW 299 g 18%,limited view of heart because of fetal position,please have pt come back for additional images,no obvious abnormalities   Chaperone: N/A No results found for this or any previous visit (from the past 24 hours).  Assessment & Plan:  1) Low-risk pregnancy G2P0010 at [redacted]w[redacted]d with an Estimated Date of Delivery: 10/15/23   2) Sciatica, gave printed exercises last visit, hasn't tried yet   Meds: No orders of the defined types were placed in this encounter.  Labs/procedures today:  U/S  Plan:  Continue routine obstetrical care  Next visit: prefers will be in person for repeat u/s     Reviewed: Preterm labor symptoms and general obstetric precautions including but not limited to vaginal bleeding, contractions, leaking of fluid and fetal movement were reviewed in detail with the patient.  All questions were answered. Does have home bp cuff. Office bp cuff given: not applicable. Check bp weekly, let us know if consistently >140 and/or >90.  Follow-up: Return in about 4 weeks (around 06/26/2023) for LROB, US:OB F/U heart, CNM, in person.  Future Appointments  Date Time Provider Department Center  08/11/2023  3:30 PM Aida Raider, NP [facility]    No orders of the defined types were placed in this encounter.  Cheral Marker CNM, [facility] 05/29/2023 9:48 AM

## 2023-06-19 ENCOUNTER — Inpatient Hospital Stay (HOSPITAL_COMMUNITY)
Admission: AD | Admit: 2023-06-19 | Discharge: 2023-06-19 | Disposition: A | Discharge status: Home / Self Care | Attending: Obstetrics and Gynecology | Admitting: Obstetrics and Gynecology

## 2023-06-19 ENCOUNTER — Encounter: Payer: Self-pay | Admitting: Women's Health

## 2023-06-19 ENCOUNTER — Encounter (HOSPITAL_COMMUNITY): Payer: Self-pay | Admitting: Obstetrics and Gynecology

## 2023-06-19 DIAGNOSIS — O368120 Decreased fetal movements, second trimester, not applicable or unspecified: Secondary | ICD-10-CM | POA: Diagnosis present

## 2023-06-19 DIAGNOSIS — Z3A23 23 weeks gestation of pregnancy: Secondary | ICD-10-CM

## 2023-06-19 NOTE — MAU Note (Signed)
.  Courtney Spencer is a 28 y.o. at [redacted]w[redacted]d here in MAU reporting decreased FM since Tues. States could feel FM a lot on Tues and has felt FM since then but just not as much. Denies LOF or VB.   LMP: n/a Onset of complaint: 2days Pain score: 0 Vitals:   06/19/23 2140 06/19/23 2143  BP:  111/72  Pulse: 93   Resp: 17   Temp: 98.1 F (36.7 C)   SpO2: 98%      FHT: 140  Lab orders placed from triage: none

## 2023-06-19 NOTE — MAU Provider Note (Signed)
 History     CSN: [id number]  Arrival date and time: 06/19/23 2129   Event Date/Time   First Provider Initiated Contact with Patient 06/19/2023  9:43 PM   Chief Complaint  Patient presents with   Decreased Fetal Movement    HPI  Courtney Spencer is a 28 y.o. G2P0010 at [redacted]w[redacted]d who presents to the MAU for decreased fetal movement. Patient reports she had noticed a lot of fetal movement two days ago, but since then has noticed fetal movement decreased. She reports she still feels flutters and smaller movements, but felt different than what she typically feels. She had been at work for the past two days. She denies LOF, ctx, VB. She has been trying to drink more water  -- currently drinking about 48oz water  total a day. States that when she got into parking lot today, she noticed normal FM. Reporting normal FM at this time and clicking FM clicker regularly during MAU stay.  Past Medical History:  Diagnosis Date   Allergy    Anxiety    Cat scratch fever    Depression    Endometriosis    GERD (gastroesophageal reflux disease)    Heart murmur of newborn    Helicobacter pylori ab+    H.pylori serology positive in Jan 2015, treated with Amoxicillin  and Flagyl at that time. Returned to clinic Feb 2015 and had rechecked, with a different provider treating with Amoxicillin  and Biaxin. H.pylori stool antigen ordered April 2015 and negative.   Hematuria 08/09/2013   History of kidney stones    Kidney stones 10/26/2013   Migraines    Other and unspecified ovarian cyst 08/09/2013   Ovarian cyst    UTI (lower urinary tract infection)     Past Surgical History:  Procedure Laterality Date   BIOPSY  04/21/2020   Procedure: BIOPSY;  Surgeon: Vinetta Greening, DO;  Location: [facility];  Service: Endoscopy;;   CHOLECYSTECTOMY N/A 05/12/2015   Procedure: LAPAROSCOPIC CHOLECYSTECTOMY;  Surgeon: Alanda Allegra, MD;  Location: [facility];  Service: General;  Laterality: N/A;    ESOPHAGOGASTRODUODENOSCOPY N/A 08/13/2013   Dr. Nolene Baumgarten: nodular gastritis, negative H.pylori   ESOPHAGOGASTRODUODENOSCOPY (EGD) WITH PROPOFOL  N/A 04/21/2020   Surgeon: Vinetta Greening, DO; Small hiatal hernia, gastritis biopsied, normal examined duodenum biopsied, normal esophagus biopsied.  Pathology revealed benign duodenal biopsy, mild chronic gastritis negative for H. pylori, benign esophageal biopsy.   WISDOM TOOTH EXTRACTION      Family History  Problem Relation Age of Onset   Gallbladder disease Mother        gallbladder was removed   Migraines Mother    Migraines Sister    Hypertension Sister    Seizures Sister    Scoliosis Sister    ADD / ADHD Brother    ADD / ADHD Brother    Hypertension Maternal Grandmother    Cancer Maternal Grandfather        lung   Cancer Paternal Grandfather        lung   Colon cancer Other        unknown   Inflammatory bowel disease Neg Hx     Social History   Tobacco Use   Smoking status: Never   Smokeless tobacco: Never   Tobacco comments:    Never really smoked  Vaping Use   Vaping status: Never Used  Substance Use Topics   Alcohol use: No    Alcohol/week: 0.0 standard drinks of alcohol   Drug use: No  Allergies:  Allergies  Allergen Reactions   Latex Other (See Comments)    Causes a burning sensation    No medications prior to admission.    ROS reviewed and pertinent positives and negatives as documented in HPI.  Physical Exam   Blood pressure 110/72, pulse 82, temperature 98.1 F (36.7 C), resp. rate 17, height 4\' 11"  (1.499 m), weight 93.9 kg, last menstrual period 01/08/2023, SpO2 98%.  Physical Exam Constitutional:      General: She is not in acute distress.    Appearance: Normal appearance. She is not ill-appearing.  HENT:     Head: Normocephalic and atraumatic.  Cardiovascular:     Rate and Rhythm: Normal rate.  Pulmonary:     Effort: Pulmonary effort is normal.     Breath sounds: Normal breath sounds.   Abdominal:     Palpations: Abdomen is soft.     Tenderness: There is no abdominal tenderness. There is no guarding.  Musculoskeletal:        General: Normal range of motion.  Skin:    General: Skin is warm and dry.     Findings: No rash.  Neurological:     General: No focal deficit present.     Mental Status: She is alert and oriented to person, place, and time.   EFM: 140/mod/-a/-d  MAU Course  Procedures  MDM 27 y.o. G2P0010 at [redacted]w[redacted]d presenting for decreased fetal movement x 2 days, now has returned to normal. Exam reassuring, pt in no distress. EFM as expected given GA. BSUS performed w vigorous fetal activity, reassurance given. Given return precautions and discharged.  Assessment and Plan     ICD-10-CM   1. Decreased fetal movements in second trimester, single or unspecified fetus  O36.8120     Pt reported FM back to baseline on presentation to MAU BSUS performed for reassurance EFM as expected for GA Stable for d/c   Melanie Spires, MD OB Fellow, Faculty Practice [facility], [facility]  06/19/2023, 11:25 PM

## 2023-06-26 ENCOUNTER — Ambulatory Visit: Admitting: Obstetrics & Gynecology

## 2023-06-26 ENCOUNTER — Encounter: Payer: Self-pay | Admitting: Obstetrics & Gynecology

## 2023-06-26 ENCOUNTER — Ambulatory Visit: Admitting: Radiology

## 2023-06-26 VITALS — BP 110/75 | HR 73 | Wt 208.0 lb

## 2023-06-26 DIAGNOSIS — Z3480 Encounter for supervision of other normal pregnancy, unspecified trimester: Secondary | ICD-10-CM

## 2023-06-26 DIAGNOSIS — Z3482 Encounter for supervision of other normal pregnancy, second trimester: Secondary | ICD-10-CM | POA: Diagnosis not present

## 2023-06-26 DIAGNOSIS — Z3A24 24 weeks gestation of pregnancy: Secondary | ICD-10-CM | POA: Diagnosis not present

## 2023-06-26 DIAGNOSIS — Z362 Encounter for other antenatal screening follow-up: Secondary | ICD-10-CM | POA: Diagnosis not present

## 2023-06-26 NOTE — Progress Notes (Signed)
 US : GA = 24+1 weeks US  today to complete cardiac screen and EFW Single active female fetus, cephalic, FHR = 140 bpm, MVP = 4.5 cm EFW 15.9% AC 21% 595g   cardiac screen completed - no structural defects seen Anatomy screen complete - nl ov's, CL = 4.5 cm,  closed

## 2023-06-26 NOTE — Progress Notes (Signed)
   LOW-RISK PREGNANCY VISIT Patient name: Courtney Spencer MRN [id number]  Date of birth: 1995/09/01 Chief Complaint:   Routine Prenatal Visit  History of Present Illness:   Courtney Spencer is a 28 y.o. G2P0010 female at [redacted]w[redacted]d with an Estimated Date of Delivery: 10/15/23 being seen today for ongoing management of a low-risk pregnancy.     04/14/2023    3:51 PM 01/14/2022    3:32 PM 06/08/2021    1:13 PM 12/29/2020    2:57 PM 12/11/2020    8:42 AM  Depression screen PHQ 2/9  Decreased Interest 1 1 0 0 0  Down, Depressed, Hopeless 0 1 0 0 0  PHQ - 2 Score 1 2 0 0 0  Altered sleeping 0 1 1 0   Tired, decreased energy 2 1 2  0   Change in appetite 0 0 1 0   Feeling bad or failure about yourself  0 1 0 0   Trouble concentrating 0 0 0 0   Moving slowly or fidgety/restless 0 0 0 0   Suicidal thoughts 0 0 0 0   PHQ-9 Score 3 5 4  0   Difficult doing work/chores   Not difficult at all Not difficult at all     Today she reports no complaints. Contractions: Not present. Vag. Bleeding: None.  Movement: Present. denies leaking of fluid. Review of Systems:   Pertinent items are noted in HPI Denies abnormal vaginal discharge w/ itching/odor/irritation, headaches, visual changes, shortness of breath, chest pain, abdominal pain, severe nausea/vomiting, or problems with urination or bowel movements unless otherwise stated above. Pertinent History Reviewed:  Reviewed past medical,surgical, social, obstetrical and family history.  Reviewed problem list, medications and allergies. Physical Assessment:   Vitals:   06/26/23 1049  BP: 110/75  Pulse: 73  Weight: 208 lb (94.3 kg)  Body mass index is 42.01 kg/m.        Physical Examination:   General appearance: Well appearing, and in no distress  Mental status: Alert, oriented to person, place, and time  Skin: Warm & dry  Cardiovascular: Normal heart rate noted  Respiratory: Normal respiratory effort, no distress  Abdomen: Soft,  gravid, nontender  Pelvic: Cervical exam deferred         Extremities:    Fetal Status:     Movement: Present    Chaperone: n/a    No results found for this or any previous visit (from the past 24 hours).  Assessment & Plan:  1) Low-risk pregnancy G2P0010 at [redacted]w[redacted]d with an Estimated Date of Delivery: 10/15/23      Meds: No orders of the defined types were placed in this encounter.  Labs/procedures today: sonogram is normal today  Plan:  Continue routine obstetrical care  Next visit: prefers in person      Follow-up: Return in about 4 weeks (around 07/24/2023) for LROB.  No orders of the defined types were placed in this encounter.   Wendelyn Halter, MD 06/26/2023 11:10 AM

## 2023-07-25 ENCOUNTER — Ambulatory Visit (INDEPENDENT_AMBULATORY_CARE_PROVIDER_SITE_OTHER): Admitting: Obstetrics and Gynecology

## 2023-07-25 VITALS — BP 110/73 | HR 86 | Wt 210.0 lb

## 2023-07-25 DIAGNOSIS — Z3483 Encounter for supervision of other normal pregnancy, third trimester: Secondary | ICD-10-CM

## 2023-07-25 DIAGNOSIS — Z3A28 28 weeks gestation of pregnancy: Secondary | ICD-10-CM

## 2023-07-25 DIAGNOSIS — Z3480 Encounter for supervision of other normal pregnancy, unspecified trimester: Secondary | ICD-10-CM

## 2023-07-25 NOTE — Progress Notes (Signed)
   PRENATAL VISIT NOTE  Subjective:  Courtney Spencer is a 28 y.o. G2P0010 at [redacted]w[redacted]d being seen today for ongoing prenatal care.  She is currently monitored for the following issues for this low-risk pregnancy and has Migraines with aura; Dyspareunia in female; Chronic pelvic pain in female; Dysmenorrhea; Anxiety, generalized; Concentration deficit; Excessive sweating, local; Gallbladder disease; Gastroesophageal reflux disease without esophagitis; History of Helicobacter pylori infection; Intractable migraine without aura and without status migrainosus; Irritable bowel syndrome with diarrhea; Kidney stones; Stress incontinence in female; Vitamin D  deficiency; Endometriosis; Allergic rhinitis due to pollen; Encounter for supervision of normal pregnancy, antepartum; and Rubella non-immune status, antepartum on their problem list.  Patient reports .  Contractions: Not present. Vag. Bleeding: None.  Movement: Present. Denies leaking of fluid.   The following portions of the patient's history were reviewed and updated as appropriate: allergies, current medications, past family history, past medical history, past social history, past surgical history and problem list.   Objective:    Vitals:   07/25/23 0927  BP: 110/73  Pulse: 86  Weight: 210 lb (95.3 kg)    Fetal Status:    Fundal Height: 148 cm Movement: Present    General: Alert, oriented and cooperative. Patient is in no acute distress.  Skin: Skin is warm and dry. No rash noted.   Cardiovascular: Normal heart rate noted  Respiratory: Normal respiratory effort, no problems with respiration noted  Abdomen: Soft, gravid, appropriate for gestational age.  Pain/Pressure: Absent     Pelvic: Cervical exam deferred        Extremities: Normal range of motion.     Mental Status: Normal mood and affect. Normal behavior. Normal judgment and thought content.   Assessment and Plan:  Pregnancy: G2P0010 at [redacted]w[redacted]d 1. Supervision of other  normal pregnancy, antepartum (Primary) BP and FHR normal Doing well, feeling regular movement    2. [redacted] weeks gestation of pregnancy GTT and labs today  List of peds given - CBC - RPR - HIV Antibody (routine testing w rflx) - Glucose Tolerance, 2 Hours w/1 Hour - Antibody screen   Preterm labor symptoms and general obstetric precautions including but not limited to vaginal bleeding, contractions, leaking of fluid and fetal movement were reviewed in detail with the patient. Please refer to After Visit Summary for other counseling recommendations.   Return in about 2 weeks (around 08/08/2023), or schedule with midwife.  Future Appointments  Date Time Provider Department Center  08/11/2023  8:50 AM Ferd Householder, CNM [facility]  08/11/2023  3:30 PM Eustacio Highman, NP [facility]    Susi Eric, FNP

## 2023-07-25 NOTE — Patient Instructions (Signed)

## 2023-07-26 LAB — RPR: RPR Ser Ql: NONREACTIVE

## 2023-07-26 LAB — CBC
Hematocrit: 33.7 % — ABNORMAL LOW (ref 34.0–46.6)
Hemoglobin: 11.2 g/dL (ref 11.1–15.9)
MCH: 29.6 pg (ref 26.6–33.0)
MCHC: 33.2 g/dL (ref 31.5–35.7)
MCV: 89 fL (ref 79–97)
Platelets: 299 10*3/uL (ref 150–450)
RBC: 3.78 x10E6/uL (ref 3.77–5.28)
RDW: 13.5 % (ref 11.7–15.4)
WBC: 9.3 10*3/uL (ref 3.4–10.8)

## 2023-07-26 LAB — HIV ANTIBODY (ROUTINE TESTING W REFLEX): HIV Screen 4th Generation wRfx: NONREACTIVE

## 2023-07-26 LAB — ANTIBODY SCREEN: Antibody Screen: NEGATIVE

## 2023-07-26 LAB — GLUCOSE TOLERANCE, 2 HOURS W/ 1HR
Glucose, 1 hour: 203 mg/dL — ABNORMAL HIGH (ref 70–179)
Glucose, 2 hour: 163 mg/dL — ABNORMAL HIGH (ref 70–152)
Glucose, Fasting: 91 mg/dL (ref 70–91)

## 2023-07-28 ENCOUNTER — Other Ambulatory Visit: Payer: Self-pay | Admitting: Advanced Practice Midwife

## 2023-07-28 ENCOUNTER — Ambulatory Visit: Payer: Self-pay | Admitting: Obstetrics and Gynecology

## 2023-07-28 DIAGNOSIS — Z8632 Personal history of gestational diabetes: Secondary | ICD-10-CM | POA: Insufficient documentation

## 2023-07-28 DIAGNOSIS — O24410 Gestational diabetes mellitus in pregnancy, diet controlled: Secondary | ICD-10-CM

## 2023-07-28 DIAGNOSIS — O24419 Gestational diabetes mellitus in pregnancy, unspecified control: Secondary | ICD-10-CM | POA: Insufficient documentation

## 2023-07-28 MED ORDER — ACCU-CHEK SOFTCLIX LANCETS MISC
12 refills | Status: DC
Start: 2023-07-28 — End: 2023-09-29

## 2023-07-28 MED ORDER — ACCU-CHEK SOFTCLIX LANCETS MISC: 12 refills | Status: DC

## 2023-07-28 MED ORDER — ACCU-CHEK GUIDE ME W/DEVICE KIT
1.0000 | PACK | Freq: Four times a day (QID) | 0 refills | Status: DC
Start: 2023-07-28 — End: 2023-07-29

## 2023-07-28 MED ORDER — GLUCOSE BLOOD VI STRP
ORAL_STRIP | 12 refills | Status: DC
Start: 2023-07-28 — End: 2023-07-28

## 2023-07-28 MED ORDER — GLUCOSE BLOOD VI STRP
ORAL_STRIP | 12 refills | Status: DC
Start: 2023-07-28 — End: 2023-09-29

## 2023-08-11 ENCOUNTER — Encounter: Payer: Self-pay | Admitting: Women's Health

## 2023-08-11 ENCOUNTER — Ambulatory Visit: Admitting: Women's Health

## 2023-08-11 ENCOUNTER — Telehealth: Payer: No Typology Code available for payment source | Admitting: Gastroenterology

## 2023-08-11 VITALS — BP 95/66 | HR 67 | Wt 208.0 lb

## 2023-08-11 DIAGNOSIS — O0993 Supervision of high risk pregnancy, unspecified, third trimester: Secondary | ICD-10-CM

## 2023-08-11 DIAGNOSIS — O24410 Gestational diabetes mellitus in pregnancy, diet controlled: Secondary | ICD-10-CM | POA: Diagnosis not present

## 2023-08-11 DIAGNOSIS — Z23 Encounter for immunization: Secondary | ICD-10-CM | POA: Diagnosis not present

## 2023-08-11 DIAGNOSIS — O0990 Supervision of high risk pregnancy, unspecified, unspecified trimester: Secondary | ICD-10-CM

## 2023-08-11 DIAGNOSIS — Z3A30 30 weeks gestation of pregnancy: Secondary | ICD-10-CM | POA: Diagnosis not present

## 2023-08-11 NOTE — Patient Instructions (Addendum)
 Courtney Spencer, thank you for choosing our office today! We appreciate the opportunity to meet your healthcare needs. You may receive a short survey by mail, e-mail, or through [employer]. If you are happy with your care we would appreciate if you could take just a few minutes to complete the survey questions. We read all of your comments and take your feedback very seriously. Thank you again for choosing our office.  Center for [employer] Team at [facility]  [facility] at [facility] ([address] [place], [place] [address]) Entrance C, located off of [address] Free 24/7 valet parking   CLASSES: Go to [email or web address] to register for classes (childbirth, breastfeeding, waterbirth, infant CPR, daddy bootcamp, etc.)  Call the office [phone]) or go to [facility] if: You begin to have strong, frequent contractions Your water  breaks.  Sometimes it is a big gush of fluid, sometimes it is just a trickle that keeps getting your panties wet or running down your legs You have vaginal bleeding.  It is normal to have a small amount of spotting if your cervix was checked.  You don't feel your baby moving like normal.  If you don't, get you something to eat and drink and lay down and focus on feeling your baby move.   If your baby is still not moving like normal, you should call the office or go to [facility].  Call the office [phone]) or go to [facility] hospital for these signs of pre-eclampsia: Severe headache that does not go away with Tylenol  Visual changes- seeing spots, double, blurred vision Pain under your right breast or upper abdomen that does not go away with Tums or heartburn medicine Nausea and/or vomiting Severe swelling in your hands, feet, and face   Tdap Vaccine It is recommended that you get the Tdap vaccine during the third trimester of EACH pregnancy to help protect your baby from getting pertussis (whooping cough) 27-36 weeks is the BEST time to do  this so that you can pass the protection on to your baby. During pregnancy is better than after pregnancy, but if you are unable to get it during pregnancy it will be offered at the hospital.  You can get this vaccine with us , at the health department, your family doctor, or some local pharmacies Everyone who will be around your baby should also be up-to-date on their vaccines before the baby comes. Adults (who are not pregnant) only need 1 dose of Tdap during adulthood.   [facility] Pediatricians/Family Doctors [facility]): [address] Meg Spina, [phone]           [facility]: [address], [phone]                [facility] Medicine [facility]): [address], [phone] (call to ask if accepting patients) [facility] Department: [address], [place], [phone]    [facility] Pediatricians/Family Doctors [facility]): [phone] [address], [phone] [facility]: [address] [place], [phone] [facility] [place]: [address], [phone]  [facility] Doctors  [facility]): [phone] [facility]: [address], [phone]   [facility] Doctors [facility]: [address] [phone]  [facility] Family Doctors  [facility]: [phone], [phone]  Home Blood Pressure Monitoring for Patients   Your provider has recommended that you check your  blood pressure (BP) at least once a week at home. If you do not have a blood pressure cuff at home, one will be provided for you. Contact your provider if you have not received your monitor within 1 week.   Helpful Tips for Accurate Home Blood Pressure Checks  Don't smoke, exercise, or drink caffeine 30 minutes before checking your BP Use the restroom before checking your BP (a full bladder can raise your  pressure) Relax in a comfortable upright chair Feet on the ground Left arm resting comfortably on a flat surface at the level of your heart Legs uncrossed Back supported Sit quietly and don't talk Place the cuff on your bare arm Adjust snuggly, so that only two fingertips can fit between your skin and the top of the cuff Check 2 readings separated by at least one minute Keep a log of your BP readings For a visual, please reference this diagram: http://ccnc.care/bpdiagram  Provider Name: [facility]     Phone: [phone]  Zone 1: ALL CLEAR  Continue to monitor your symptoms:  BP reading is less than 140 (top number) or less than 90 (bottom number)  No right upper stomach pain No headaches or seeing spots No feeling nauseated or throwing up No swelling in face and hands  Zone 2: CAUTION Call your doctor's office for any of the following:  BP reading is greater than 140 (top number) or greater than 90 (bottom number)  Stomach pain under your ribs in the middle or right side Headaches or seeing spots Feeling nauseated or throwing up Swelling in face and hands  Zone 3: EMERGENCY  Seek immediate medical care if you have any of the following:  BP reading is greater than160 (top number) or greater than 110 (bottom number) Severe headaches not improving with Tylenol  Serious difficulty catching your breath Any worsening symptoms from Zone 2  Preterm Labor and Birth Information  The normal length of a pregnancy is 39-41 weeks. Preterm labor is when labor starts before 37 completed weeks of pregnancy. What are the risk factors for preterm labor? Preterm labor is more likely to occur in women who: Have certain infections during pregnancy such as a bladder infection, sexually transmitted infection, or infection inside the uterus (chorioamnionitis). Have a shorter-than-normal cervix. Have gone into preterm labor before. Have had surgery on their cervix. Are younger than age 103  or older than age 20. Are African American. Are pregnant with twins or multiple babies (multiple gestation). Take street drugs or smoke while pregnant. Do not gain enough weight while pregnant. Became pregnant shortly after having been pregnant. What are the symptoms of preterm labor? Symptoms of preterm labor include: Cramps similar to those that can happen during a menstrual period. The cramps may happen with diarrhea. Pain in the abdomen or lower back. Regular uterine contractions that may feel like tightening of the abdomen. A feeling of increased pressure in the pelvis. Increased watery or bloody mucus discharge from the vagina. Water  breaking (ruptured amniotic sac). Why is it important to recognize signs of preterm labor? It is important to recognize signs of preterm labor because babies who are born prematurely may not be fully developed. This can put them at an increased risk for: Long-term (chronic) heart and lung problems. Difficulty immediately after birth with regulating body systems, including blood sugar, body temperature, heart rate, and breathing rate. Bleeding in the brain. Cerebral palsy. Learning difficulties. Death. These risks are highest for babies who are born before 34 weeks  of pregnancy. How is preterm labor treated? Treatment depends on the length of your pregnancy, your condition, and the health of your baby. It may involve: Having a stitch (suture) placed in your cervix to prevent your cervix from opening too early (cerclage). Taking or being given medicines, such as: Hormone medicines. These may be given early in pregnancy to help support the pregnancy. Medicine to stop contractions. Medicines to help mature the baby's lungs. These may be prescribed if the risk of delivery is high. Medicines to prevent your baby from developing cerebral palsy. If the labor happens before 34 weeks of pregnancy, you may need to stay in the hospital. What should I do if I  think I am in preterm labor? If you think that you are going into preterm labor, call your health care provider right away. How can I prevent preterm labor in future pregnancies? To increase your chance of having a full-term pregnancy: Do not use any tobacco products, such as cigarettes, chewing tobacco, and e-cigarettes. If you need help quitting, ask your health care provider. Do not use street drugs or medicines that have not been prescribed to you during your pregnancy. Talk with your health care provider before taking any herbal supplements, even if you have been taking them regularly. Make sure you gain a healthy amount of weight during your pregnancy. Watch for infection. If you think that you might have an infection, get it checked right away. Make sure to tell your health care provider if you have gone into preterm labor before. This information is not intended to replace advice given to you by your health care provider. Make sure you discuss any questions you have with your health care provider. Document Revised: 06/05/2018 Document Reviewed: 07/05/2015 Elsevier Patient Education  2020 [employer].   Considering [place]? Guide for patients at Center for [employer] [facility]) Why consider waterbirth? Gentle birth for babies  Less pain medicine used in labor  May allow for passive descent/less pushing  May reduce perineal tears  More mobility and instinctive maternal position changes  Increased maternal relaxation   Is waterbirth safe? What are the risks of infection, drowning or other complications? Infection:  Very low risk (3.7 % for tub vs 4.8% for bed)  7 in 8000 waterbirths with documented infection  Poorly cleaned equipment most common cause  Slightly lower group B strep transmission rate  Drowning  Maternal:  Very low risk  Related to seizures or fainting  Newborn:  Very low risk. No evidence of increased risk of respiratory problems in multiple large  studies  Physiological protection from breathing under water   Avoid underwater birth if there are any fetal complications  Once baby's head is out of the water , keep it out.  Birth complication  Some reports of cord trauma, but risk decreased by bringing baby to surface gradually  No evidence of increased risk of shoulder dystocia. Mothers can usually change positions faster in water  than in a bed, possibly aiding the maneuvers to free the shoulder.   There are 2 things you MUST do to have a waterbirth with [facility]: Attend a waterbirth class at [employer] [facility] at [facility]   3rd Wednesday of every month from 7-9 pm (virtual during COVID) [employer] at [email or web address] or [email or web address] or by calling [phone] Bring us  the certificate from the class to your prenatal appointment or send via MyChart Meet with a midwife at 36 weeks* to see if you can still plan a waterbirth and  to sign the consent.   *We also recommend that you schedule as many of your prenatal visits with a midwife as possible.    Helpful information: You may want to bring a bathing suit top to the hospital to wear during labor but this is optional.  All other supplies are provided by the hospital. Please arrive at the hospital with signs of active labor, and do not wait at home until late in labor. It takes 45 min- 1 hour for fetal monitoring, and check in to your room to take place, plus transport and filling of the waterbirth tub.    Things that would prevent you from having a waterbirth: Premature, <37wks  Previous cesarean birth  Presence of thick meconium-stained fluid  Multiple gestation (Twins, triplets, etc.)  Uncontrolled diabetes or gestational diabetes requiring medication  Hypertension diagnosed in pregnancy or preexisting hypertension (gestational hypertension, preeclampsia, or chronic hypertension) Fetal growth restriction (your baby measures less than 10th percentile  on ultrasound) Heavy vaginal bleeding  Non-reassuring fetal heart rate  Active infection (MRSA, etc.). Group B Strep is NOT a contraindication for waterbirth.  If your labor has to be induced and induction method requires continuous monitoring of the baby's heart rate  Other risks/issues identified by your obstetrical provider   Please remember that birth is unpredictable. Under certain unforeseeable circumstances your provider may advise against giving birth in the tub. These decisions will be made on a case-by-case basis and with the safety of you and your baby as our highest priority.    Updated 05/30/21

## 2023-08-11 NOTE — Progress Notes (Signed)
 HIGH-RISK PREGNANCY VISIT Patient name: Courtney Spencer MRN [id number]  Date of birth: September 27, 1995 Chief Complaint:   Routine Prenatal Visit (Tdap today)  History of Present Illness:   Courtney Spencer [name] is a 28 y.o. G2P0010 female at [redacted]w[redacted]d with an Estimated Date of Delivery: 10/15/23 being seen today for ongoing management of a high-risk pregnancy complicated by diabetes mellitus A1DM and PGBMI 38.    Today she reports all sugars wnl, goes to dietician next week. Did not eat/drink after midnight the night before GTT. Contractions: Not present.  .  Movement: Present. denies leaking of fluid.      04/14/2023    3:51 PM 01/14/2022    3:32 PM 06/08/2021    1:13 PM 12/29/2020    2:57 PM 12/11/2020    8:42 AM  Depression screen PHQ 2/9  Decreased Interest 1 1 0 0 0  Down, Depressed, Hopeless 0 1 0 0 0  PHQ - 2 Score 1 2 0 0 0  Altered sleeping 0 1 1 0   Tired, decreased energy 2 1 2  0   Change in appetite 0 0 1 0   Feeling bad or failure about yourself  0 1 0 0   Trouble concentrating 0 0 0 0   Moving slowly or fidgety/restless 0 0 0 0   Suicidal thoughts 0 0 0 0   PHQ-9 Score 3 5 4  0   Difficult doing work/chores   Not difficult at all Not difficult at all         04/14/2023    3:51 PM 01/14/2022    3:32 PM 12/10/2019    1:04 PM  GAD 7 : Generalized Anxiety Score  Nervous, Anxious, on Edge 1 1 1   Control/stop worrying 0 1 1  Worry too much - different things 0 1 0  Trouble relaxing 0 1 0  Restless 0 0 0  Easily annoyed or irritable 0 0 0  Afraid - awful might happen 0 0 2  Total GAD 7 Score 1 4 4      Review of Systems:   Pertinent items are noted in HPI Denies abnormal vaginal discharge w/ itching/odor/irritation, headaches, visual changes, shortness of breath, chest pain, abdominal pain, severe nausea/vomiting, or problems with urination or bowel movements unless otherwise stated above. Pertinent History Reviewed:  Reviewed past medical,surgical, social,  obstetrical and family history.  Reviewed problem list, medications and allergies. Physical Assessment:   Vitals:   08/11/23 0900  BP: 95/66  Pulse: 67  Weight: 208 lb (94.3 kg)  Body mass index is 42.01 kg/m.           Physical Examination:   General appearance: alert, well appearing, and in no distress  Mental status: alert, oriented to person, place, and time  Skin: warm & dry   Extremities: Edema: None    Cardiovascular: normal heart rate noted  Respiratory: normal respiratory effort, no distress  Abdomen: gravid, soft, non-tender  Pelvic: Cervical exam deferred         Fetal Status: Fetal Heart Rate (bpm): 125 Fundal Height: 30 cm Movement: Present    Fetal Surveillance Testing today: doppler   Chaperone: N/A  No results found for this or any previous visit (from the past 24 hours).  Assessment & Plan:  High-risk pregnancy: G2P0010 at [redacted]w[redacted]d with an Estimated Date of Delivery: 10/15/23   1) A1DM, stable, keep appt w/ dietician next week, EFW next visit. Is paying OOP for supplies (was expensive w/ insurance),  ran out of strips- pharmacies out of ones for her machine- is going to call around, will let us  know if can't get them.   2) Interested in Systems developer, has attended class, will send certificate via MyChart today. Understands providers at hospital during admission will determine eligibility   3) PGBMI 38> currently 42  Meds: No orders of the defined types were placed in this encounter.   Labs/procedures today: Tdap  Treatment Plan:  ASA [x]    EFW q 4-6w @ 28w    No antenatal testing as long as remains off meds     Deliver @ 39-39.6wks:____   Reviewed: Preterm labor symptoms and general obstetric precautions including but not limited to vaginal bleeding, contractions, leaking of fluid and fetal movement were reviewed in detail with the patient.  All questions were answered. Does have home bp cuff. Office bp cuff given: not applicable. Check bp weekly, let us  know if  consistently >140 and/or >90.  Follow-up: Return in about 2 weeks (around 08/25/2023) for HROB, US :EFW, MD or CNM, in person.   Future Appointments  Date Time Provider Department Center  08/20/2023  9:00 AM [facility] GDM CLASS [facility] NDM  08/25/2023 11:30 AM [facility] - [facility] US  [facility] None  08/25/2023  1:30 PM Wendelyn Halter, MD [facility]  09/02/2023  3:30 PM Eustacio Highman, NP [facility]    Orders Placed This Encounter  Procedures   US  OB Follow Up   Tdap vaccine greater than or equal to 7yo IM   Ferd Householder CNM, [facility] 08/11/2023 9:39 AM

## 2023-08-13 ENCOUNTER — Ambulatory Visit

## 2023-08-14 ENCOUNTER — Encounter: Payer: Self-pay | Admitting: Women's Health

## 2023-08-20 ENCOUNTER — Encounter: Attending: Advanced Practice Midwife | Admitting: Dietician

## 2023-08-20 DIAGNOSIS — O24410 Gestational diabetes mellitus in pregnancy, diet controlled: Secondary | ICD-10-CM | POA: Diagnosis present

## 2023-08-21 ENCOUNTER — Other Ambulatory Visit: Payer: Self-pay | Admitting: Obstetrics and Gynecology

## 2023-08-21 DIAGNOSIS — O24410 Gestational diabetes mellitus in pregnancy, diet controlled: Secondary | ICD-10-CM

## 2023-08-25 ENCOUNTER — Encounter: Payer: Self-pay | Admitting: Obstetrics & Gynecology

## 2023-08-25 ENCOUNTER — Ambulatory Visit: Admitting: Obstetrics & Gynecology

## 2023-08-25 ENCOUNTER — Ambulatory Visit

## 2023-08-25 VITALS — BP 106/71 | HR 84 | Wt 206.0 lb

## 2023-08-25 DIAGNOSIS — Z3A32 32 weeks gestation of pregnancy: Secondary | ICD-10-CM | POA: Diagnosis not present

## 2023-08-25 DIAGNOSIS — O0993 Supervision of high risk pregnancy, unspecified, third trimester: Secondary | ICD-10-CM | POA: Diagnosis not present

## 2023-08-25 DIAGNOSIS — O24410 Gestational diabetes mellitus in pregnancy, diet controlled: Secondary | ICD-10-CM

## 2023-08-25 DIAGNOSIS — O0990 Supervision of high risk pregnancy, unspecified, unspecified trimester: Secondary | ICD-10-CM

## 2023-08-25 NOTE — Progress Notes (Addendum)
 US  32+5 wks,cephalic,FHR 140 bpm,anterior placenta gr 0,normal ovaries,AFI 17 cm,EFW 1882 g 21%,FL 2.5%

## 2023-08-25 NOTE — Progress Notes (Signed)
 HIGH-RISK PREGNANCY VISIT Patient name: Courtney Spencer MRN [id number]  Date of birth: Oct 25, 1995 Chief Complaint:   Routine Prenatal Visit  History of Present Illness:   Courtney Spencer is a 28 y.o. G2P0010 female at [redacted]w[redacted]d with an Estimated Date of Delivery: 10/15/23 being seen today for ongoing management of a high-risk pregnancy complicated by     ICD-10-CM   1. Supervision of high risk pregnancy, antepartum  O09.90     2. Gestational diabetes mellitus, class A1: Good CBG EFW21%  O24.410     3. [redacted] weeks gestation of pregnancy  Z3A.32      .    Today she reports no complaints. Contractions: Not present. Vag. Bleeding: None.  Movement: Present. denies leaking of fluid.      08/20/2023   11:09 AM 04/14/2023    3:51 PM 01/14/2022    3:32 PM 06/08/2021    1:13 PM 12/29/2020    2:57 PM  Depression screen PHQ 2/9  Decreased Interest 0 1 1 0 0  Down, Depressed, Hopeless 0 0 1 0 0  PHQ - 2 Score 0 1 2 0 0  Altered sleeping  0 1 1 0  Tired, decreased energy  2 1 2  0  Change in appetite  0 0 1 0  Feeling bad or failure about yourself   0 1 0 0  Trouble concentrating  0 0 0 0  Moving slowly or fidgety/restless  0 0 0 0  Suicidal thoughts  0 0 0 0  PHQ-9 Score  3 5 4  0  Difficult doing work/chores    Not difficult at all Not difficult at all        04/14/2023    3:51 PM 01/14/2022    3:32 PM 12/10/2019    1:04 PM  GAD 7 : Generalized Anxiety Score  Nervous, Anxious, on Edge 1 1 1   Control/stop worrying 0 1 1  Worry too much - different things 0 1 0  Trouble relaxing 0 1 0  Restless 0 0 0  Easily annoyed or irritable 0 0 0  Afraid - awful might happen 0 0 2  Total GAD 7 Score 1 4 4      Review of Systems:   Pertinent items are noted in HPI Denies abnormal vaginal discharge w/ itching/odor/irritation, headaches, visual changes, shortness of breath, chest pain, abdominal pain, severe nausea/vomiting, or problems with urination or bowel movements unless  otherwise stated above. Pertinent History Reviewed:  Reviewed past medical,surgical, social, obstetrical and family history.  Reviewed problem list, medications and allergies. Physical Assessment:   Vitals:   08/25/23 1217  BP: 106/71  Pulse: 84  Weight: 206 lb (93.4 kg)  Body mass index is 41.61 kg/m.           Physical Examination:   General appearance: alert, well appearing, and in no distress  Mental status: alert, oriented to person, place, and time  Skin: warm & dry   Extremities: Edema: None    Cardiovascular: normal heart rate noted  Respiratory: normal respiratory effort, no distress  Abdomen: gravid, soft, non-tender  Pelvic: Cervical exam deferred         Fetal Status:     Movement: Present    Fetal Surveillance Testing today:  EFW 21%  Chaperone: N/A    No results found for this or any previous visit (from the past 24 hours).  Assessment & Plan:  High-risk pregnancy: G2P0010 at [redacted]w[redacted]d with an Estimated Date of  Delivery: 10/15/23      ICD-10-CM   1. Supervision of high risk pregnancy, antepartum  O09.90     2. Gestational diabetes mellitus, class A1: Good CBG EFW21%  O24.410     3. [redacted] weeks gestation of pregnancy  Z3A.32          Meds: No orders of the defined types were placed in this encounter.   Orders: No orders of the defined types were placed in this encounter.    Labs/procedures today: U/S  Treatment Plan:  keep scheduled    Follow-up: Return in about 2 weeks (around 09/08/2023) for HROB.   Future Appointments  Date Time Provider Department Center  08/25/2023  1:50 PM Jayne Vonn DEL, MD [facility]  09/02/2023  3:30 PM Kennedy Charmaine CROME, NP [facility]    No orders of the defined types were placed in this encounter.  Vonn DEL Jayne  Attending Physician for the Center for [facility] 08/25/2023 12:37 PM

## 2023-09-02 ENCOUNTER — Encounter: Payer: Self-pay | Admitting: Gastroenterology

## 2023-09-02 ENCOUNTER — Ambulatory Visit: Admitting: Gastroenterology

## 2023-09-02 ENCOUNTER — Other Ambulatory Visit: Payer: Self-pay | Admitting: Gastroenterology

## 2023-09-02 VITALS — BP 109/74 | HR 89 | Temp 98.4°F | Ht 59.0 in | Wt 210.4 lb

## 2023-09-02 DIAGNOSIS — K219 Gastro-esophageal reflux disease without esophagitis: Secondary | ICD-10-CM | POA: Diagnosis not present

## 2023-09-02 DIAGNOSIS — R195 Other fecal abnormalities: Secondary | ICD-10-CM

## 2023-09-02 MED ORDER — OMEPRAZOLE 40 MG PO CPDR: 40.0000 mg | DELAYED_RELEASE_CAPSULE | Freq: Two times a day (BID) | ORAL | 1 refills | Status: DC

## 2023-09-02 NOTE — Progress Notes (Signed)
 GI Office Note    Referring Provider: No ref. provider found Primary Care Physician:  Patient, No Pcp Per Primary Gastroenterologist: Carlin POUR. Cindie, DO  Date:  09/02/2023  ID:  Courtney Spencer, DOB Apr 09, 1995, MRN [id number]  Chief Complaint   Chief Complaint  Patient presents with   Follow-up    Follow up. No problems    History of Present Illness  Courtney Spencer is a 28 y.o. female with a history of GERD, H. pylori s/p eradication, abdominal pain, chronic loose stools, and endometriosis presenting today with no complaints.  EGD 04/21/2020: -Small hiatal hernia - Gastritis s/p biopsy (chronic gastritis negative for H. Pylori) - Normal duodenum/P biopsy (benign) - Normal esophagus s/p biopsy (benign) - Recommended PPI twice daily for 8 weeks and then decrease to once daily.   OV 12/27/2020.  Patient reported everything was giving her heartburn and had done well on PPI twice daily.  Not experiencing any dysphagia.  Was having occasional nausea without vomiting and improved epigastric pain but still tender on palpation.  Ongoing chronic abdominal pain that continued after cholecystectomy.  Intermittent cramping that occurs daily.  Somewhat improved with PPI twice daily seem to be worse with her.  And some relief with bowel movements.  Noted chronic loose stools 3-6 bowel movements per day usually postprandial, which was occurring prior to cholecystectomy.  Pain is usually 5-6/10.  Denied BRBPR or melena.  Noted NSAID use about once per month.  Denied any family history of IBD.  Advised to increase omeprazole  to 40 mg twice daily.  GERD lifestyle/diet reinforced.  ESR and CRP checked and was given dicyclomine  10 mg up to 3 times daily and at bedtime and to follow a lactose-free diet.   ESR and CRP never completed.   VV 01/01/22. Taking omeprazole  twice daily but recently had run out. Trying to eliminate certain foods and triggers. Would like to restart medications  BID. Has been nauseas but no vomiting. Actively trying to get pregnancy. Has looser stools. Waits to eat till later in the day due to this. Omeprazole  40 BID written. GERD diet and avoidance of lactose recommended. Low  FODMAP diet. Follow up 1 year.   Last office visit 02/10/23.  Sometimes forgetful to take her omeprazole  twice daily but usually takes it at least once daily.  When she forgets to take it she does have burning in her throat and tries to identify triggers and avoid them once identified.  Denied any nausea or vomiting.  No dysphagia and having a good appetite.  Suspect her anxiety contributes to some of her looser stools however also has history of cholecystectomy.  Sometimes can have 4-5 bowel movements at a time but not usually liquid in nature, semiformed or mushy usually.  Suspects some mild lactose intolerance.  Advise continue omeprazole  1-2 times daily and following GERD diet and continuing to identify triggers.  Advise Metamucil fiber supplementation to see if this helps with some of her loose stools and some urgency.  Encouraged ongoing low-fat diet.  Follow-up 6 months.   Today:  Is [redacted] weeks pregnant. Constipation has been occurring here recently but not too bad. No pain related to bowel movements.  No melena or brbpr.   No hemorrhoid issues. No worsening reflux symptoms. Kept her on omeprazole  the whole time and continues to take 1-2 times daily.  Walking lots and sugars have been a little high toward the end but highest has been 143 (bananas). No insulin needed.  No issues with loose stools. Has been better since pregnancy and able to eat without urgency as she was previously having.  Has had about a 20 pound weight gain since her pregnancy.  No issues with appetite.  No nausea, vomiting, or dysphagia.   Wt Readings from Last 5 Encounters:  09/02/23 210 lb 6.4 oz (95.4 kg)  08/25/23 206 lb (93.4 kg)  08/11/23 208 lb (94.3 kg)  07/25/23 210 lb (95.3 kg)  06/26/23 208 lb  (94.3 kg)    Current Outpatient Medications  Medication Sig Dispense Refill   Accu-Chek Softclix Lancets lancets Use as instructed to check blood sugar 4 times daily 100 each 12   Blood Glucose Monitoring Suppl (ACCU-CHEK GUIDE) w/Device KIT USE TO CHECK GLUCOSE 4 TIMES DAILY 1 kit 0   glucose blood test strip Use as instructed to check blood sugar four times daily 100 each 12   Loratadine (CLARITIN PO) Take by mouth.     prenatal vitamin w/FE, FA (PRENATAL 1 + 1) 27-1 MG TABS tablet Take 1 tablet by mouth daily at 12 noon. 30 tablet 12   aspirin  EC 81 MG tablet Take 2 tablets (162 mg total) by mouth daily. (Patient not taking: Reported on 09/02/2023) 60 tablet 6   Cholecalciferol (VITAMIN D3) 125 MCG (5000 UT) CAPS Take by mouth.     omeprazole  (PRILOSEC ) 40 MG capsule Take 1 capsule (40 mg total) by mouth in the morning and at bedtime. 180 capsule 1   No current facility-administered medications for this visit.    Past Medical History:  Diagnosis Date   Allergy    Anxiety    Cat scratch fever    Depression    Endometriosis    GERD (gastroesophageal reflux disease)    Heart murmur of newborn    Helicobacter pylori ab+    H.pylori serology positive in Jan 2015, treated with Amoxicillin  and Flagyl at that time. Returned to clinic Feb 2015 and had rechecked, with a different provider treating with Amoxicillin  and Biaxin. H.pylori stool antigen ordered April 2015 and negative.   Hematuria 08/09/2013   History of kidney stones    Kidney stones 10/26/2013   Migraines    Other and unspecified ovarian cyst 08/09/2013   Ovarian cyst    UTI (lower urinary tract infection)     Past Surgical History:  Procedure Laterality Date   BIOPSY  04/21/2020   Procedure: BIOPSY;  Surgeon: Cindie Carlin POUR, DO;  Location: [facility];  Service: Endoscopy;;   CHOLECYSTECTOMY N/A 05/12/2015   Procedure: LAPAROSCOPIC CHOLECYSTECTOMY;  Surgeon: Oneil Budge, MD;  Location: [facility];  Service: General;   Laterality: N/A;   ESOPHAGOGASTRODUODENOSCOPY N/A 08/13/2013   Dr. Harvey: nodular gastritis, negative H.pylori   ESOPHAGOGASTRODUODENOSCOPY (EGD) WITH PROPOFOL  N/A 04/21/2020   Surgeon: Cindie Carlin POUR, DO; Small hiatal hernia, gastritis biopsied, normal examined duodenum biopsied, normal esophagus biopsied.  Pathology revealed benign duodenal biopsy, mild chronic gastritis negative for H. pylori, benign esophageal biopsy.   WISDOM TOOTH EXTRACTION      Family History  Problem Relation Age of Onset   Gallbladder disease Mother        gallbladder was removed   Migraines Mother    Migraines Sister    Hypertension Sister    Seizures Sister    Scoliosis Sister    ADD / ADHD Brother    ADD / ADHD Brother    Hypertension Maternal Grandmother    Cancer Maternal Grandfather  lung   Cancer Paternal Grandfather        lung   Colon cancer Other        unknown   Inflammatory bowel disease Neg Hx     Allergies as of 09/02/2023 - Review Complete 09/02/2023  Allergen Reaction Noted   Latex Other (See Comments) 09/30/2022    Social History   Socioeconomic History   Marital status: Married    Spouse name: Not on file   Number of children: Not on file   Years of education: Not on file   Highest education level: Not on file  Occupational History   Occupation: student    Comment: Early Childhood Development, 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   Tobacco Use   Smoking status: Never   Smokeless tobacco: Never   Tobacco comments:    Never really smoked  Vaping Use   Vaping status: Never Used  Substance and Sexual Activity   Alcohol use: No    Alcohol/week: 0.0 standard drinks of alcohol   Drug use: No   Sexual activity: Yes    Partners: Male    Birth control/protection: None  Other Topics Concern   Not on file  Social History Narrative   Not on file   Social Drivers of Health   Financial Resource Strain: Low Risk  (04/14/2023)   Overall  Financial Resource Strain (CARDIA)    Difficulty of Paying Living Expenses: Not hard at all  Food Insecurity: No Food Insecurity (08/20/2023)   Hunger Vital Sign    Worried About Running Out of Food in the Last Year: Never true    Ran Out of Food in the Last Year: Never true  Transportation Needs: No Transportation Needs (04/14/2023)   PRAPARE - Administrator, Civil Service (Medical): No    Lack of Transportation (Non-Medical): No  Physical Activity: Insufficiently Active (04/14/2023)   Exercise Vital Sign    Days of Exercise per Week: 1 day    Minutes of Exercise per Session: 10 min  Stress: No Stress Concern Present (04/14/2023)   [employer] of Occupational Health - Occupational Stress Questionnaire    Feeling of Stress : Only a little  Social Connections: Moderately Isolated (04/14/2023)   Social Connection and Isolation Panel    Frequency of Communication with Friends and Family: More than three times a week    Frequency of Social Gatherings with Friends and Family: Twice a week    Attends Religious Services: Never    Database administrator or Organizations: No    Attends Engineer, structural: Never    Marital Status: Married     Review of Systems   Gen: Denies fever, chills, anorexia. Denies fatigue, weakness, weight loss.  CV: Denies chest pain, palpitations, syncope, peripheral edema, and claudication. Resp: Denies dyspnea at rest, cough, wheezing, coughing up blood, and pleurisy. GI: See HPI Derm: Denies rash, itching, dry skin Psych: Denies depression, anxiety, memory loss, confusion. No homicidal or suicidal ideation.  Heme: Denies bruising, bleeding, and enlarged lymph nodes.  Physical Exam   BP 109/74 (BP Location: Right Arm, Patient Position: Sitting, Cuff Size: Normal)   Pulse 89   Temp 98.4 F (36.9 C) (Temporal)   Ht 4' 11 (1.499 m)   Wt 210 lb 6.4 oz (95.4 kg)   LMP 01/08/2023 (Approximate)   BMI 42.50 kg/m   General:   Alert  and oriented. No distress noted. Pleasant and cooperative.  Head:  Normocephalic and atraumatic. Eyes:  Conjuctiva clear without scleral icterus. Mouth:  Oral mucosa pink and moist. Good dentition. No lesions. Lungs:  Clear to auscultation bilaterally. No wheezes, rales, or rhonchi. No distress.  Heart:  S1, S2 present without murmurs appreciated.  Abdomen:  +BS, soft, non-tender. Rounded, firm secondary to large uterus and fetus. No rebound or guarding. UTA HSM.  Rectal: deferred Msk:  Symmetrical without gross deformities. Normal posture. Extremities:  Without edema. Neurologic:  Alert and  oriented x4 Psych:  Alert and cooperative. Normal mood and affect.  Assessment  Courtney Spencer is a 28 y.o. female presenting today with no significant complaints.  GERD: Reflux remains well-controlled at this point with omeprazole  40 mg 1-2 times daily.  Not currently having any significant breakthrough symptoms despite pregnancy.  OB/GYN did not discontinue her PPI during pregnancy.  Will continue current plan for PPI and reinforced GERD diet.  Advised her to please let me know if symptoms worsen or if things change postdelivery.  Loose stools: Previously with loose stools along with postprandial urgency.  Previously recommended Metamucil however shortly after her last visit she found that she was pregnant.  With her pregnancy she has been having normal stools, no issues with constipation, urgency, or loose stools.  Has been trying to adhere to healthy diet given her pregnancy and gestational diabetes.  Advised her that with hormonal fluctuations she can have varying bowel frequency and consistency.  If she begins to have low she/loose stools and/or some incomplete emptying to resume Metamucil or if she is having other fluctuation of bowel habits to make an appointment or contact the office after delivery for further recommendations.  We did discuss waiting at least 8 weeks to allow for some  normalization of hormones and getting back to more normal diet to see how her new normal is or if she goes back to having loose stools with urgency.  PLAN   Omeprazole  40 mg 1-2 times daily GERD diet Advised to resume Metamucil if having issues with incomplete emptying and or frequent loose stools. Congratulated her on her pregnancy - she is due next month, currently 34 weeks. Follow up 6 months, sooner if needed.     Charmaine Melia, MSN, FNP-BC, AGACNP-BC [facility]

## 2023-09-02 NOTE — Patient Instructions (Addendum)
 No changes to your medication today.  Continue taking your omeprazole  1-2 times daily.  Continue to avoid any dietary triggers.  As we discussed, symptoms can fluctuate postpartum therefore if you are having any worsening reflux symptoms or having return of frequent loose stools with urgency please feel free to reach back out to the office.  Sometimes these fluctuations in symptoms occur due to hormonal fluctuations and stress postdelivery therefore I would recommend waiting to see what your new normal is for 6-8 weeks after delivery and if you are having any issues please reach out via MyChart and/or make an appointment.  Congratulations on baby boy!!!  It was a pleasure to see you today. I want to create trusting relationships with patients. If you receive a survey regarding your visit,  I greatly appreciate you taking time to fill this out on paper or through your MyChart. I value your feedback.  Charmaine Melia, MSN, FNP-BC, AGACNP-BC [facility]

## 2023-09-03 ENCOUNTER — Other Ambulatory Visit: Payer: Self-pay | Admitting: Gastroenterology

## 2023-09-03 DIAGNOSIS — K219 Gastro-esophageal reflux disease without esophagitis: Secondary | ICD-10-CM

## 2023-09-03 MED ORDER — OMEPRAZOLE 40 MG PO CPDR: 40.0000 mg | DELAYED_RELEASE_CAPSULE | Freq: Two times a day (BID) | ORAL | 1 refills | Status: DC

## 2023-09-03 NOTE — Telephone Encounter (Signed)
 PA for quantity limit has been submitted. Pending ins approval/denial.

## 2023-09-03 NOTE — Telephone Encounter (Signed)
 You're welcome!

## 2023-09-08 ENCOUNTER — Encounter: Payer: Self-pay | Admitting: Obstetrics & Gynecology

## 2023-09-08 ENCOUNTER — Ambulatory Visit (INDEPENDENT_AMBULATORY_CARE_PROVIDER_SITE_OTHER): Admitting: Obstetrics & Gynecology

## 2023-09-08 VITALS — BP 110/71 | HR 74 | Wt 210.0 lb

## 2023-09-08 DIAGNOSIS — O24410 Gestational diabetes mellitus in pregnancy, diet controlled: Secondary | ICD-10-CM

## 2023-09-08 DIAGNOSIS — O0990 Supervision of high risk pregnancy, unspecified, unspecified trimester: Secondary | ICD-10-CM

## 2023-09-08 DIAGNOSIS — Z3A34 34 weeks gestation of pregnancy: Secondary | ICD-10-CM

## 2023-09-08 NOTE — Progress Notes (Signed)
 HIGH-RISK PREGNANCY VISIT Patient name: Courtney Spencer MRN [id number]  Date of birth: 1995/07/20 Chief Complaint:   Routine Prenatal Visit  History of Present Illness:   Courtney Spencer is a 28 y.o. G2P0010 female at [redacted]w[redacted]d with an Estimated Date of Delivery: 10/15/23 being seen today for ongoing management of a high-risk pregnancy complicated by     ICD-10-CM   1. Supervision of high risk pregnancy, antepartum  O09.90     2. Gestational diabetes mellitus, class A1: Good CBG EFW21%  O24.410      .    Today she reports no complaints. Contractions: Not present. Vag. Bleeding: None.  Movement: Present. denies leaking of fluid.      08/20/2023   11:09 AM 04/14/2023    3:51 PM 01/14/2022    3:32 PM 06/08/2021    1:13 PM 12/29/2020    2:57 PM  Depression screen PHQ 2/9  Decreased Interest 0 1 1 0 0  Down, Depressed, Hopeless 0 0 1 0 0  PHQ - 2 Score 0 1 2 0 0  Altered sleeping  0 1 1 0  Tired, decreased energy  2 1 2  0  Change in appetite  0 0 1 0  Feeling bad or failure about yourself   0 1 0 0  Trouble concentrating  0 0 0 0  Moving slowly or fidgety/restless  0 0 0 0  Suicidal thoughts  0 0 0 0  PHQ-9 Score  3 5 4  0  Difficult doing work/chores    Not difficult at all Not difficult at all        04/14/2023    3:51 PM 01/14/2022    3:32 PM 12/10/2019    1:04 PM  GAD 7 : Generalized Anxiety Score  Nervous, Anxious, on Edge 1 1 1   Control/stop worrying 0 1 1  Worry too much - different things 0 1 0  Trouble relaxing 0 1 0  Restless 0 0 0  Easily annoyed or irritable 0 0 0  Afraid - awful might happen 0 0 2  Total GAD 7 Score 1 4 4      Review of Systems:   Pertinent items are noted in HPI Denies abnormal vaginal discharge w/ itching/odor/irritation, headaches, visual changes, shortness of breath, chest pain, abdominal pain, severe nausea/vomiting, or problems with urination or bowel movements unless otherwise stated above. Pertinent History  Reviewed:  Reviewed past medical,surgical, social, obstetrical and family history.  Reviewed problem list, medications and allergies. Physical Assessment:   Vitals:   09/08/23 0924  BP: 110/71  Pulse: 74  Weight: 210 lb (95.3 kg)  Body mass index is 42.41 kg/m.           Physical Examination:   General appearance: alert, well appearing, and in no distress and oriented to person, place, and time  Mental status: alert, oriented to person, place, and time  Skin: warm & dry   Extremities:      Cardiovascular: normal heart rate noted  Respiratory: normal respiratory effort, no distress  Abdomen: gravid, soft, non-tender  Pelvic: Cervical exam deferred         Fetal Status: Fetal Heart Rate (bpm): 142 Fundal Height: 34 cm Movement: Present Presentation: Vertex  Fetal Surveillance Testing today: FHR 142   Chaperone: N/A    No results found for this or any previous visit (from the past 24 hours).  Assessment & Plan:  High-risk pregnancy: G2P0010 at [redacted]w[redacted]d with an Estimated Date of  Delivery: 10/15/23      ICD-10-CM   1. Supervision of high risk pregnancy, antepartum  O09.90     2. Gestational diabetes mellitus, class A1: Good CBG EFW21%  O24.410          Meds: No orders of the defined types were placed in this encounter.   Orders: No orders of the defined types were placed in this encounter.    Labs/procedures today: none  Treatment Plan:  Return in about 2 weeks (around 09/22/2023) for schedule EFW sonogram at nest visit + HROB appt.     Follow-up: Return in about 2 weeks (around 09/22/2023) for schedule EFW sonogram at nest visit + HROB appt.   No future appointments.  No orders of the defined types were placed in this encounter.  Vonn VEAR Inch  Attending Physician for the Center for [facility] 09/08/2023 10:02 AM

## 2023-09-23 ENCOUNTER — Ambulatory Visit (INDEPENDENT_AMBULATORY_CARE_PROVIDER_SITE_OTHER): Admitting: Obstetrics & Gynecology

## 2023-09-23 ENCOUNTER — Encounter: Payer: Self-pay | Admitting: Obstetrics & Gynecology

## 2023-09-23 ENCOUNTER — Other Ambulatory Visit (HOSPITAL_COMMUNITY)
Admission: RE | Admit: 2023-09-23 | Discharge: 2023-09-23 | Disposition: A | Discharge status: Home / Self Care | Source: Ambulatory Visit | Attending: Obstetrics & Gynecology | Admitting: Obstetrics & Gynecology

## 2023-09-23 ENCOUNTER — Other Ambulatory Visit: Payer: Self-pay | Admitting: Women's Health

## 2023-09-23 ENCOUNTER — Ambulatory Visit (INDEPENDENT_AMBULATORY_CARE_PROVIDER_SITE_OTHER)

## 2023-09-23 VITALS — BP 94/59 | HR 80 | Wt 212.0 lb

## 2023-09-23 DIAGNOSIS — O365990 Maternal care for other known or suspected poor fetal growth, unspecified trimester, not applicable or unspecified: Secondary | ICD-10-CM | POA: Insufficient documentation

## 2023-09-23 DIAGNOSIS — O24410 Gestational diabetes mellitus in pregnancy, diet controlled: Secondary | ICD-10-CM

## 2023-09-23 DIAGNOSIS — O0993 Supervision of high risk pregnancy, unspecified, third trimester: Secondary | ICD-10-CM

## 2023-09-23 DIAGNOSIS — Z3A36 36 weeks gestation of pregnancy: Secondary | ICD-10-CM | POA: Diagnosis not present

## 2023-09-23 DIAGNOSIS — O0990 Supervision of high risk pregnancy, unspecified, unspecified trimester: Secondary | ICD-10-CM

## 2023-09-23 DIAGNOSIS — Z8759 Personal history of other complications of pregnancy, childbirth and the puerperium: Secondary | ICD-10-CM | POA: Insufficient documentation

## 2023-09-23 DIAGNOSIS — O365930 Maternal care for other known or suspected poor fetal growth, third trimester, not applicable or unspecified: Secondary | ICD-10-CM | POA: Diagnosis not present

## 2023-09-23 NOTE — Progress Notes (Signed)
 US  36+6 wks,cephalic,BPP 8/8,anterior placenta gr 3,AFI 19 cm,FHR 130 bpm,EFW 2282 g 4%,AC 7%,elevated UAD with EDF,RI .77,.64,.71=96%

## 2023-09-23 NOTE — Patient Instructions (Signed)
 Please go to [facility] for induction of labor Thursday night (July 31st) @ 11:45pm.  The induction is scheduled for August 1st midnight.

## 2023-09-23 NOTE — Progress Notes (Signed)
 HIGH-RISK PREGNANCY VISIT Patient name: Courtney Spencer MRN [id number]  Date of birth: [birth year] Chief Complaint:   Routine Prenatal Visit  History of Present Illness:   Courtney Spencer is a 28 y.o. G2P0010 female at [redacted]w[redacted]d with an Estimated Date of Delivery: 10/15/23 being seen today for ongoing management of a high-risk pregnancy complicated by:  GDMA1- diet controlled   Today she reports no complaints.   Contractions: Not present. Vag. Bleeding: None.  Movement: Present. denies leaking of fluid.      08/20/2023   11:09 AM 04/14/2023    3:51 PM 01/14/2022    3:32 PM 06/08/2021    1:13 PM 12/29/2020    2:57 PM  Depression screen PHQ 2/9  Decreased Interest 0 1 1 0 0  Down, Depressed, Hopeless 0 0 1 0 0  PHQ - 2 Score 0 1 2 0 0  Altered sleeping  0 1 1 0  Tired, decreased energy  2 1 2  0  Change in appetite  0 0 1 0  Feeling bad or failure about yourself   0 1 0 0  Trouble concentrating  0 0 0 0  Moving slowly or fidgety/restless  0 0 0 0  Suicidal thoughts  0 0 0 0  PHQ-9 Score  3 5 4  0  Difficult doing work/chores    Not difficult at all Not difficult at all     Current Outpatient Medications  Medication Instructions   Accu-Chek Softclix Lancets lancets Use as instructed to check blood sugar 4 times daily   aspirin  EC 162 mg, Oral, Daily   Blood Glucose Monitoring Suppl (ACCU-CHEK GUIDE) w/Device KIT USE TO CHECK GLUCOSE 4 TIMES DAILY   Cholecalciferol (VITAMIN D3) 125 MCG (5000 UT) CAPS Take by mouth.   glucose blood test strip Use as instructed to check blood sugar four times daily   Loratadine (CLARITIN PO) Take by mouth.   omeprazole  (PRILOSEC ) 40 mg, Oral, 2 times daily before meals   prenatal vitamin w/FE, FA (PRENATAL 1 + 1) 27-1 MG TABS tablet 1 tablet, Oral, Daily     Review of Systems:   Pertinent items are noted in HPI Denies abnormal vaginal discharge w/ itching/odor/irritation, headaches, visual changes, shortness of breath, chest  pain, abdominal pain, severe nausea/vomiting, or problems with urination or bowel movements unless otherwise stated above. Pertinent History Reviewed:  Reviewed past medical,surgical, social, obstetrical and family history.  Reviewed problem list, medications and allergies. Physical Assessment:   Vitals:   09/23/23 1517  BP: (!) 94/59  Pulse: 80  Weight: 212 lb (96.2 kg)  Body mass index is 42.82 kg/m.           Physical Examination:   General appearance: alert, well appearing, and in no distress  Mental status: normal mood, behavior, speech, dress, motor activity, and thought processes  Skin: warm & dry   Extremities:   no edema   Cardiovascular: normal heart rate noted  Respiratory: normal respiratory effort, no distress  Abdomen: gravid, soft, non-tender  Pelvic: Cervical exam performed  Closed/thick/-3      Vaginal swabs obtained  Fetal Status:     Movement: Present    Fetal Surveillance Testing today: cephalic,BPP 8/8,anterior placenta gr 3,AFI 19 cm,FHR 130 bpm,EFW 2282 g 4%,AC 7%,elevated UAD with EDF,RI .77,.64,.71=96%    Chaperone: Alan Fischer    No results found for this or any previous visit (from the past 24 hours).   Assessment & Plan:  High-risk pregnancy: G2P0010  at [redacted]w[redacted]d with an Estimated Date of Delivery: 10/15/23   1) FGR - Reviewed results of today's ultrasound - Based on current [facility] guidelines, recommendation to proceed with delivery at 37 to 38 weeks - IOL scheduled for 8/1 midnight  2) GDMA1 Diet controlled  - Patient aware that due to induction, she will no longer be able to have a water  birth  Meds: No orders of the defined types were placed in this encounter.   Labs/procedures today: GBS, GCC, growth, BPP, dopplers  Treatment Plan:  as outlined above  Reviewed: Preterm labor symptoms and general obstetric precautions including but not limited to vaginal bleeding, contractions, leaking of fluid and fetal movement were reviewed in detail  with the patient.  All questions were answered. Pt has home bp cuff. Check bp weekly, let us  know if >140/90.   Follow-up: Return in about 1 week (around 09/30/2023) for HROB visit (ok CNM).   Future Appointments  Date Time Provider Department Center  10/01/2023  3:30 PM Loreli Suzen BIRCH, CNM [facility]  10/07/2023  3:30 PM Kizzie Suzen SAUNDERS, CNM [facility]  10/14/2023  2:30 PM Marilynn Nest, DO [facility]    Orders Placed This Encounter  Procedures   Culture, beta strep (group b only)    Gasper Hopes, DO Attending Obstetrician & Gynecologist, [facility] [employer], [facility]

## 2023-09-24 ENCOUNTER — Telehealth (HOSPITAL_COMMUNITY): Payer: Self-pay | Admitting: *Deleted

## 2023-09-24 ENCOUNTER — Encounter (HOSPITAL_COMMUNITY): Payer: Self-pay | Admitting: *Deleted

## 2023-09-24 NOTE — Telephone Encounter (Signed)
 Preadmission screen

## 2023-09-25 LAB — CERVICOVAGINAL ANCILLARY ONLY
Chlamydia: NEGATIVE
Comment: NEGATIVE
Comment: NORMAL
Neisseria Gonorrhea: NEGATIVE

## 2023-09-26 ENCOUNTER — Other Ambulatory Visit: Payer: Self-pay

## 2023-09-26 ENCOUNTER — Inpatient Hospital Stay (HOSPITAL_COMMUNITY)

## 2023-09-26 ENCOUNTER — Encounter (HOSPITAL_COMMUNITY): Payer: Self-pay | Admitting: Obstetrics & Gynecology

## 2023-09-26 ENCOUNTER — Inpatient Hospital Stay (HOSPITAL_COMMUNITY)
Admission: RE | Admit: 2023-09-26 | Discharge: 2023-09-29 | DRG: 788 | Disposition: A | Discharge status: Home / Self Care | Attending: Obstetrics and Gynecology | Admitting: Obstetrics and Gynecology

## 2023-09-26 DIAGNOSIS — O24420 Gestational diabetes mellitus in childbirth, diet controlled: Secondary | ICD-10-CM | POA: Diagnosis present

## 2023-09-26 DIAGNOSIS — O9962 Diseases of the digestive system complicating childbirth: Secondary | ICD-10-CM | POA: Diagnosis present

## 2023-09-26 DIAGNOSIS — K219 Gastro-esophageal reflux disease without esophagitis: Secondary | ICD-10-CM | POA: Diagnosis present

## 2023-09-26 DIAGNOSIS — O365990 Maternal care for other known or suspected poor fetal growth, unspecified trimester, not applicable or unspecified: Principal | ICD-10-CM | POA: Diagnosis present

## 2023-09-26 DIAGNOSIS — Z3A37 37 weeks gestation of pregnancy: Secondary | ICD-10-CM

## 2023-09-26 DIAGNOSIS — Z8249 Family history of ischemic heart disease and other diseases of the circulatory system: Secondary | ICD-10-CM

## 2023-09-26 DIAGNOSIS — O9902 Anemia complicating childbirth: Secondary | ICD-10-CM | POA: Diagnosis present

## 2023-09-26 DIAGNOSIS — O134 Gestational [pregnancy-induced] hypertension without significant proteinuria, complicating childbirth: Secondary | ICD-10-CM | POA: Diagnosis present

## 2023-09-26 DIAGNOSIS — O365930 Maternal care for other known or suspected poor fetal growth, third trimester, not applicable or unspecified: Secondary | ICD-10-CM | POA: Diagnosis present

## 2023-09-26 DIAGNOSIS — Z91040 Latex allergy status: Secondary | ICD-10-CM

## 2023-09-26 DIAGNOSIS — O321XX0 Maternal care for breech presentation, not applicable or unspecified: Secondary | ICD-10-CM | POA: Diagnosis present

## 2023-09-26 DIAGNOSIS — Z8759 Personal history of other complications of pregnancy, childbirth and the puerperium: Secondary | ICD-10-CM | POA: Diagnosis present

## 2023-09-26 LAB — CBC
HCT: 30.8 % — ABNORMAL LOW (ref 36.0–46.0)
Hemoglobin: 10.3 g/dL — ABNORMAL LOW (ref 12.0–15.0)
MCH: 28.5 pg (ref 26.0–34.0)
MCHC: 33.4 g/dL (ref 30.0–36.0)
MCV: 85.1 fL (ref 80.0–100.0)
Platelets: 308 K/uL (ref 150–400)
RBC: 3.62 MIL/uL — ABNORMAL LOW (ref 3.87–5.11)
RDW: 14.2 % (ref 11.5–15.5)
WBC: 11.5 K/uL — ABNORMAL HIGH (ref 4.0–10.5)
nRBC: 0 % (ref 0.0–0.2)

## 2023-09-26 LAB — GLUCOSE, CAPILLARY
Glucose-Capillary: 100 mg/dL — ABNORMAL HIGH (ref 70–99)
Glucose-Capillary: 108 mg/dL — ABNORMAL HIGH (ref 70–99)
Glucose-Capillary: 111 mg/dL — ABNORMAL HIGH (ref 70–99)
Glucose-Capillary: 111 mg/dL — ABNORMAL HIGH (ref 70–99)
Glucose-Capillary: 72 mg/dL (ref 70–99)
Glucose-Capillary: 87 mg/dL (ref 70–99)

## 2023-09-26 LAB — TYPE AND SCREEN
ABO/RH(D): O POS
Antibody Screen: NEGATIVE

## 2023-09-26 LAB — RPR: RPR Ser Ql: NONREACTIVE

## 2023-09-26 MED ORDER — LIDOCAINE HCL (PF) 1 % IJ SOLN: 30.0000 mL | INTRAMUSCULAR | Status: DC | PRN

## 2023-09-26 MED ORDER — LACTATED RINGERS IV SOLN
500.0000 mL | INTRAVENOUS | Status: DC | PRN
  Administered 2023-09-26 – 2023-09-27 (×2): 500 mL via INTRAVENOUS

## 2023-09-26 MED ORDER — OXYCODONE-ACETAMINOPHEN 5-325 MG PO TABS: 1.0000 | ORAL_TABLET | ORAL | Status: DC | PRN

## 2023-09-26 MED ORDER — OXYTOCIN-SODIUM CHLORIDE 30-0.9 UT/500ML-% IV SOLN
2.5000 [IU]/h | INTRAVENOUS | Status: DC
  Administered 2023-09-27: 30 [IU] via INTRAVENOUS

## 2023-09-26 MED ORDER — OXYTOCIN-SODIUM CHLORIDE 30-0.9 UT/500ML-% IV SOLN
1.0000 m[IU]/min | INTRAVENOUS | Status: DC
  Administered 2023-09-26 – 2023-09-27 (×2): 2 m[IU]/min via INTRAVENOUS
  Filled 2023-09-26: qty 500

## 2023-09-26 MED ORDER — MISOPROSTOL 25 MCG QUARTER TABLET
25.0000 ug | ORAL_TABLET | ORAL | Status: DC
  Administered 2023-09-26 (×2): 25 ug via BUCCAL
  Filled 2023-09-26 (×2): qty 1

## 2023-09-26 MED ORDER — TERBUTALINE SULFATE 1 MG/ML IJ SOLN
0.2500 mg | Freq: Once | INTRAMUSCULAR | Status: AC | PRN
  Administered 2023-09-27 (×2): 0.25 mg via SUBCUTANEOUS
  Filled 2023-09-26: qty 1

## 2023-09-26 MED ORDER — OXYCODONE-ACETAMINOPHEN 5-325 MG PO TABS: 2.0000 | ORAL_TABLET | ORAL | Status: DC | PRN

## 2023-09-26 MED ORDER — ACETAMINOPHEN 325 MG PO TABS: 650.0000 mg | ORAL_TABLET | ORAL | Status: DC | PRN

## 2023-09-26 MED ORDER — LACTATED RINGERS IV SOLN: INTRAVENOUS | Status: DC

## 2023-09-26 MED ORDER — SOD CITRATE-CITRIC ACID 500-334 MG/5ML PO SOLN
30.0000 mL | ORAL | Status: DC | PRN
  Administered 2023-09-27: 30 mL via ORAL
  Filled 2023-09-26: qty 30

## 2023-09-26 MED ORDER — ONDANSETRON HCL 4 MG/2ML IJ SOLN: 4.0000 mg | Freq: Four times a day (QID) | INTRAMUSCULAR | Status: DC | PRN

## 2023-09-26 MED ORDER — MISOPROSTOL 25 MCG QUARTER TABLET: 25.0000 ug | ORAL_TABLET | ORAL | Status: DC | PRN

## 2023-09-26 MED ORDER — TERBUTALINE SULFATE 1 MG/ML IJ SOLN: 0.2500 mg | Freq: Once | INTRAMUSCULAR | Status: DC | PRN

## 2023-09-26 MED ORDER — OXYTOCIN BOLUS FROM INFUSION: 333.0000 mL | Freq: Once | INTRAVENOUS | Status: DC

## 2023-09-26 MED ORDER — FENTANYL CITRATE (PF) 100 MCG/2ML IJ SOLN: 50.0000 ug | INTRAMUSCULAR | Status: DC | PRN

## 2023-09-26 NOTE — Progress Notes (Signed)
 Patient ID: Courtney Spencer, female   DOB: [birth year], 29 y.o.   MRN: [id number]  BP 107/72   Pulse 96   Temp 98.3 F (36.8 C) (Oral)   Resp 16   Ht 4' 11 (1.499 m)   Wt 96 kg   LMP 01/08/2023 (Approximate)   SpO2 98%   BMI 42.74 kg/m   Dilation: 1 Effacement (%): Thick Cervical Position: Posterior Station: Ballotable Presentation: Vertex Exam by:: Vernell, SNM  Patient has been up ambulating halls, now resting in bed. Not feeling any cramping or discomfort. Discussed placement of Foley balloon, patient agreeable. Speculum inserted and cervix well visualized. Foley catheter advanced through cervix and inflated with 60cc. Cytotec  25mcg buccal given. FHR Cat 1. Plan to continue q4hr Cytotec  until balloon is expelled.   Vernell Ruddle, SNM 09/26/23 [phone]

## 2023-09-26 NOTE — Plan of Care (Signed)

## 2023-09-26 NOTE — Progress Notes (Addendum)
 LABOR PROGRESS NOTE  Patient Name: Courtney Spencer, female   DOB: 1995/11/17, 28 y.o.  MRN: [id number]  Reintroduced myself to pt. FB inplace.   Blood pressure 112/74, pulse 84, temperature (!) 97.5 F (36.4 C), temperature source Oral, resp. rate 14, height 4' 11 (1.499 m), weight 96 kg, last menstrual period 01/08/2023, SpO2 98%.  Dilation: 1 Effacement (%): Thick Cervical Position: Posterior Station: Ballotable Presentation: Vertex Exam by:: Vernell, SNM  EFM: baseline 125, accels, no decels, moderate variability TOCO: q4-32min contractions  Continue expectant management, reassess FB after 8-12hours. If ctx space >5 minute could add low dose pitocin or another buccal cytotec .   GBS unknown, term no prophylaxis. Culture pending from 7/29, expect results prior to ROM if positive will treat.   CBG (last 3)  Recent Labs    09/26/23 1010 09/26/23 1518 09/26/23 1934  GLUCAP 87 72 111*    Mardy Shropshire, MD

## 2023-09-26 NOTE — Progress Notes (Signed)
 Patient ID: Courtney Spencer, female   DOB: 07/20/1995, 28 y.o.   MRN: [id number]  BP 107/69   Pulse 75   Temp 98.2 F (36.8 C) (Oral)   Resp 16   Ht 4' 11 (1.499 m)   Wt 96 kg   LMP 01/08/2023 (Approximate)   SpO2 98%   BMI 42.74 kg/m   Dilation: Closed Effacement (%): Thick Cervical Position: Posterior Station: Ballotable Presentation: Vertex (verified by BS US ) Exam by:: Vernell, SNM  Resting in bed with partner at bedside. Pitocin at 6mu/min. Irregular UC on TOCO, feeling increased abdominal cramping. Discussed cervical ripening methods including foley balloon and Cytotec . Patient agreeable to balloon placement. Cervix soft and outer os dilated to 1 cm. Unable to place foley balloon due to internal os still closed. Plan for Cytotec  25mcg buccal-- will recheck SVE in 4 hours and consider placing foley balloon at that time. Patient encouraged to eat, hydrate, and ambulate. Saline lock IV. FHR Cat 1.  Vernell Ruddle, SNM 09/26/23 1105

## 2023-09-26 NOTE — H&P (Addendum)
 OBSTETRIC ADMISSION HISTORY AND PHYSICAL  Courtney Spencer is a 28 y.o. female G2P0010 with IUP at [redacted]w[redacted]d by LMP presenting for IOL for IUGR 4% with elevated UAD with EDF. She reports +FMs, No LOF, no VB, no blurry vision, headaches or peripheral edema, and RUQ pain.  She plans on breast feeding. She request pull-out method for birth control. She received her prenatal [facility] at [facility]   Dating: By LMP --->  Estimated Date of Delivery: 10/15/23  Sono:    @[redacted]w[redacted]d ,  cephalic,BPP 8/8,anterior placenta gr 3,AFI 19 cm,FHR 130 bpm,EFW 2282 g 4%,AC 7%,elevated UAD with EDF,RI .77,.64,.71=96%     Prenatal History/Complications:  - IUGR 4% - GDM A1 - PG BMI 38 - Rubella non-immune - GBS unknown, culture pending    Past Medical History: Past Medical History:  Diagnosis Date   Allergy    Anxiety    Cat scratch fever    Depression    Endometriosis    GERD (gastroesophageal reflux disease)    Gestational diabetes    Heart murmur of newborn    Helicobacter pylori ab+    H.pylori serology positive in Jan 2015, treated with Amoxicillin  and Flagyl at that time. Returned to clinic Feb 2015 and had rechecked, with a different provider treating with Amoxicillin  and Biaxin. H.pylori stool antigen ordered April 2015 and negative.   Hematuria 08/09/2013   History of kidney stones    Kidney stones 10/26/2013   Migraines    Other and unspecified ovarian cyst 08/09/2013   Ovarian cyst    UTI (lower urinary tract infection)     Past Surgical History: Past Surgical History:  Procedure Laterality Date   BIOPSY  04/21/2020   Procedure: BIOPSY;  Surgeon: Cindie Carlin POUR, DO;  Location: [facility];  Service: Endoscopy;;   CHOLECYSTECTOMY N/A 05/12/2015   Procedure: LAPAROSCOPIC CHOLECYSTECTOMY;  Surgeon: Oneil Budge, MD;  Location: [facility];  Service: General;  Laterality: N/A;   ESOPHAGOGASTRODUODENOSCOPY N/A 08/13/2013   Dr. Harvey: nodular gastritis, negative H.pylori    ESOPHAGOGASTRODUODENOSCOPY (EGD) WITH PROPOFOL  N/A 04/21/2020   Surgeon: Cindie Carlin POUR, DO; Small hiatal hernia, gastritis biopsied, normal examined duodenum biopsied, normal esophagus biopsied.  Pathology revealed benign duodenal biopsy, mild chronic gastritis negative for H. pylori, benign esophageal biopsy.   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   Tobacco Use   Smoking status: Never   Smokeless tobacco: Never   Tobacco comments:    Never really smoked  Vaping Use   Vaping status: Never Used  Substance and Sexual Activity   Alcohol use: No  Alcohol/week: 0.0 standard drinks of alcohol   Drug use: No   Sexual activity: Yes    Partners: Male    Birth control/protection: None  Other Topics Concern   Not on file  Social History Narrative   Not on file   Social Drivers of Health   Financial Resource Strain: Low Risk  (04/14/2023)   Overall Financial Resource Strain (CARDIA)    Difficulty of Paying Living Expenses: Not hard at all  Food Insecurity: No Food Insecurity (08/20/2023)   Hunger Vital Sign    Worried About Running Out of Food in the Last Year: Never true    Ran Out of Food in the Last Year: Never true  Transportation Needs: No Transportation Needs (04/14/2023)   PRAPARE - Administrator, Civil Service (Medical): No    Lack of Transportation (Non-Medical): No  Physical Activity: Insufficiently Active  (04/14/2023)   Exercise Vital Sign    Days of Exercise per Week: 1 day    Minutes of Exercise per Session: 10 min  Stress: No Stress Concern Present (04/14/2023)   [employer] of Occupational Health - Occupational Stress Questionnaire    Feeling of Stress : Only a little  Social Connections: Moderately Isolated (04/14/2023)   Social Connection and Isolation Panel    Frequency of Communication with Friends and Family: More than three times a week    Frequency of Social Gatherings with Friends and Family: Twice a week    Attends Religious Services: Never    Database administrator or Organizations: No    Attends Engineer, structural: Never    Marital Status: Married    Family History: Family History  Problem Relation Age of Onset   Gallbladder disease Mother        gallbladder was removed   Migraines Mother    Migraines Sister    Hypertension Sister    Seizures Sister    Scoliosis Sister    ADD / ADHD Brother    ADD / ADHD Brother    Hypertension Maternal Grandmother    Cancer Maternal Grandfather        lung   Cancer Paternal Grandfather        lung   Colon cancer Other        unknown   Inflammatory bowel disease Neg Hx     Allergies: Allergies  Allergen Reactions   Latex Other (See Comments)    Causes a burning sensation    Medications Prior to Admission  Medication Sig Dispense Refill Last Dose/Taking   Accu-Chek Softclix Lancets lancets Use as instructed to check blood sugar 4 times daily 100 each 12    aspirin  EC 81 MG tablet Take 2 tablets (162 mg total) by mouth daily. 60 tablet 6    Blood Glucose Monitoring Suppl (ACCU-CHEK GUIDE) w/Device KIT USE TO CHECK GLUCOSE 4 TIMES DAILY 1 kit 0    Cholecalciferol (VITAMIN D3) 125 MCG (5000 UT) CAPS Take by mouth.      glucose blood test strip Use as instructed to check blood sugar four times daily 100 each 12    Loratadine (CLARITIN PO) Take by mouth.      omeprazole  (PRILOSEC ) 40 MG capsule Take 1  capsule (40 mg total) by mouth 2 (two) times daily before a meal. 180 capsule 1    prenatal vitamin w/FE, FA (PRENATAL 1 + 1) 27-1 MG TABS tablet Take 1 tablet by mouth daily at 12 noon. 30 tablet 12  Review of Systems   All systems reviewed and negative except as stated in HPI  Blood pressure 101/64, pulse 99, temperature 98 F (36.7 C), temperature source Oral, height 4' 11 (1.499 m), weight 96 kg, last menstrual period 01/08/2023. General appearance: alert, cooperative, appears stated age, and no distress Lungs: clear to auscultation bilaterally Heart: regular rate and rhythm Abdomen: soft, non-tender; bowel sounds normal Pelvic: adquate, unproven Extremities: Homans sign is negative, no sign of DVT Presentation: cephalic Fetal monitoringBaseline: 130 bpm, Variability: Good {> 6 bpm), Accelerations: Reactive, and Decelerations: Absent Uterine activityNone Dilation: Closed Effacement (%): Thick Station: Ballotable Exam by:: Lucienne Curls RN   Prenatal labs: ABO, Rh: --/--/O POS (08/01 0040) Antibody: NEG (08/01 0040) Rubella: <0.90 (02/17 1613) RPR: Non Reactive (05/30 0917)  HBsAg: Negative (02/17 1613)  HIV: Non Reactive (05/30 0917)  GBS:     No results found for: GBS GTT abnormal Genetic screening  negative, low risk female Anatomy US  normal female  Immunization History  Administered Date(s) Administered   HPV Quadrivalent 02/11/2008, 01/28/2013, 07/27/2013   Influenza,inj,Quad PF,6+ Mos 03/27/2017   PFIZER(Purple Top)SARS-COV-2 Vaccination 12/24/2019, 01/14/2020   PPD Test 02/07/2015   Tdap 01/12/2009, 03/27/2017, 08/11/2023    Prenatal Transfer Tool  Maternal Diabetes: Yes:  Diabetes Type:  Diet controlled Genetic Screening: Normal Maternal Ultrasounds/Referrals: IUGR Fetal Ultrasounds or other Referrals:  None Maternal Substance Abuse:  No Significant Maternal Medications:  None Significant Maternal Lab Results: Other:  GBS unknown, culture  pending Number of Prenatal Visits:greater than 3 verified prenatal visits Maternal Vaccinations:TDap Other Comments:  None   Results for orders placed or performed during the hospital encounter of 09/26/23 (from the past 24 hours)  CBC   Collection Time: 09/26/23 12:20 AM  Result Value Ref Range   WBC 11.5 (H) 4.0 - 10.5 K/uL   RBC 3.62 (L) 3.87 - 5.11 MIL/uL   Hemoglobin 10.3 (L) 12.0 - 15.0 g/dL   HCT 69.1 (L) 63.9 - 53.9 %   MCV 85.1 80.0 - 100.0 fL   MCH 28.5 26.0 - 34.0 pg   MCHC 33.4 30.0 - 36.0 g/dL   RDW 85.7 88.4 - 84.4 %   Platelets 308 150 - 400 K/uL   nRBC 0.0 0.0 - 0.2 %  Type and screen   Collection Time: 09/26/23 12:40 AM  Result Value Ref Range   ABO/RH(D) O POS    Antibody Screen NEG    Sample Expiration      09/29/2023,2359 Performed at [facility], [address] [place], [place] [address]   Glucose, capillary   Collection Time: 09/26/23  1:01 AM  Result Value Ref Range   Glucose-Capillary 111 (H) 70 - 99 mg/dL    Patient Active Problem List   Diagnosis Date Noted   Fetal growth restriction antepartum 09/23/2023   Gestational diabetes 07/28/2023   Rubella non-immune status, antepartum 04/17/2023   Supervision of high risk pregnancy, antepartum 04/11/2023   Allergic rhinitis due to pollen 06/09/2020   Endometriosis 03/17/2020   Chronic pelvic pain in female 10/25/2019   Dysmenorrhea 10/25/2019   Dyspareunia in female 08/10/2018   Excessive sweating, local 11/21/2016   Migraines with aura 08/19/2016   Irritable bowel syndrome with diarrhea 02/09/2015   Vitamin D  deficiency 05/05/2014   Anxiety, generalized 11/23/2013   Concentration deficit 11/23/2013   Stress incontinence in female 11/23/2013   Gallbladder disease 10/26/2013   Gastroesophageal reflux disease without esophagitis 10/26/2013   History of Helicobacter pylori infection 10/26/2013  Intractable migraine without aura and without status migrainosus 10/26/2013   Kidney stones  10/26/2013    Assessment/Plan:  Courtney Spencer is a 28 y.o. G2P0010 at [redacted]w[redacted]d here for IOL for IUGR 4% with elevated UAD and A1 GDM  #Labor:Low dose pitocin to assess fetal tolerance #Pain: Per pt request #FWB: Cat I #GBS status:  Unknown, culture pending #Feeding: Breastmilk  #Reproductive Life planning: Pull-out method #Circ:  yes  #A1 GDM - q4hr CBG in latent labor, q2hr in active  #IUGR with elevated dopplers  Mardy Shropshire, MD  09/26/2023, 2:35 AM

## 2023-09-27 ENCOUNTER — Inpatient Hospital Stay (HOSPITAL_COMMUNITY)

## 2023-09-27 ENCOUNTER — Encounter (HOSPITAL_COMMUNITY)
Admission: RE | Disposition: A | Discharge status: Home / Self Care | Payer: Self-pay | Source: Home / Self Care | Attending: Obstetrics and Gynecology

## 2023-09-27 ENCOUNTER — Encounter (HOSPITAL_COMMUNITY): Payer: Self-pay | Admitting: Obstetrics & Gynecology

## 2023-09-27 ENCOUNTER — Other Ambulatory Visit: Payer: Self-pay

## 2023-09-27 DIAGNOSIS — O365930 Maternal care for other known or suspected poor fetal growth, third trimester, not applicable or unspecified: Secondary | ICD-10-CM

## 2023-09-27 DIAGNOSIS — Z3A37 37 weeks gestation of pregnancy: Secondary | ICD-10-CM

## 2023-09-27 DIAGNOSIS — O24420 Gestational diabetes mellitus in childbirth, diet controlled: Secondary | ICD-10-CM

## 2023-09-27 DIAGNOSIS — O321XX0 Maternal care for breech presentation, not applicable or unspecified: Secondary | ICD-10-CM

## 2023-09-27 LAB — GLUCOSE, CAPILLARY
Glucose-Capillary: 116 mg/dL — ABNORMAL HIGH (ref 70–99)
Glucose-Capillary: 123 mg/dL — ABNORMAL HIGH (ref 70–99)

## 2023-09-27 LAB — CULTURE, BETA STREP (GROUP B ONLY): Strep Gp B Culture: NEGATIVE

## 2023-09-27 SURGERY — Surgical Case
Anesthesia: Spinal

## 2023-09-27 MED ORDER — MEPERIDINE HCL 25 MG/ML IJ SOLN: 6.2500 mg | INTRAMUSCULAR | Status: DC | PRN

## 2023-09-27 MED ORDER — IBUPROFEN 600 MG PO TABS
600.0000 mg | ORAL_TABLET | Freq: Four times a day (QID) | ORAL | Status: DC
  Administered 2023-09-28 – 2023-09-29 (×7): 600 mg via ORAL
  Filled 2023-09-27 (×7): qty 1

## 2023-09-27 MED ORDER — SIMETHICONE 80 MG PO CHEW: 80.0000 mg | CHEWABLE_TABLET | ORAL | Status: DC | PRN

## 2023-09-27 MED ORDER — DEXMEDETOMIDINE HCL IN NACL 80 MCG/20ML IV SOLN
INTRAVENOUS | Status: DC | PRN
  Administered 2023-09-27: 8 ug via INTRAVENOUS

## 2023-09-27 MED ORDER — FENTANYL-BUPIVACAINE-NACL 0.5-0.125-0.9 MG/250ML-% EP SOLN: 12.0000 mL/h | EPIDURAL | Status: DC | PRN

## 2023-09-27 MED ORDER — BUPIVACAINE IN DEXTROSE 0.75-8.25 % IT SOLN
INTRATHECAL | Status: DC | PRN
  Administered 2023-09-27: 1.5 mL via INTRATHECAL

## 2023-09-27 MED ORDER — OXYTOCIN-SODIUM CHLORIDE 30-0.9 UT/500ML-% IV SOLN
2.5000 [IU]/h | INTRAVENOUS | Status: AC
  Administered 2023-09-27: 2.5 [IU]/h via INTRAVENOUS
  Filled 2023-09-27: qty 500

## 2023-09-27 MED ORDER — MEASLES, MUMPS & RUBELLA VAC IJ SOLR: 0.5000 mL | Freq: Once | INTRAMUSCULAR | Status: DC

## 2023-09-27 MED ORDER — ENOXAPARIN SODIUM 40 MG/0.4ML IJ SOSY
40.0000 mg | PREFILLED_SYRINGE | INTRAMUSCULAR | Status: DC
  Administered 2023-09-27 – 2023-09-28 (×2): 40 mg via SUBCUTANEOUS
  Filled 2023-09-27 (×2): qty 0.4

## 2023-09-27 MED ORDER — GABAPENTIN 100 MG PO CAPS
300.0000 mg | ORAL_CAPSULE | Freq: Two times a day (BID) | ORAL | Status: DC
  Administered 2023-09-27 – 2023-09-29 (×3): 300 mg via ORAL
  Filled 2023-09-27 (×3): qty 3

## 2023-09-27 MED ORDER — CEFAZOLIN SODIUM-DEXTROSE 2-4 GM/100ML-% IV SOLN
2.0000 g | INTRAVENOUS | Status: AC
  Administered 2023-09-27: 2 g via INTRAVENOUS

## 2023-09-27 MED ORDER — FENTANYL CITRATE (PF) 100 MCG/2ML IJ SOLN
INTRAMUSCULAR | Status: AC
  Filled 2023-09-27: qty 2

## 2023-09-27 MED ORDER — DEXAMETHASONE SODIUM PHOSPHATE 10 MG/ML IJ SOLN
INTRAMUSCULAR | Status: DC | PRN
  Administered 2023-09-27: 5 mg via INTRAVENOUS

## 2023-09-27 MED ORDER — SODIUM CHLORIDE 0.9 % IR SOLN
Status: DC | PRN
  Administered 2023-09-27: 1

## 2023-09-27 MED ORDER — PHENYLEPHRINE 80 MCG/ML (10ML) SYRINGE FOR IV PUSH (FOR BLOOD PRESSURE SUPPORT): 80.0000 ug | PREFILLED_SYRINGE | INTRAVENOUS | Status: DC | PRN

## 2023-09-27 MED ORDER — MORPHINE SULFATE (PF) 0.5 MG/ML IJ SOLN
INTRAMUSCULAR | Status: DC | PRN
  Administered 2023-09-27: 150 ug via INTRATHECAL

## 2023-09-27 MED ORDER — STERILE WATER FOR IRRIGATION IR SOLN
Status: DC | PRN
  Administered 2023-09-27: 1

## 2023-09-27 MED ORDER — ACETAMINOPHEN 10 MG/ML IV SOLN
INTRAVENOUS | Status: DC | PRN
Start: 2023-09-27 — End: 2023-09-27
  Administered 2023-09-27: 1000 mg via INTRAVENOUS

## 2023-09-27 MED ORDER — KETOROLAC TROMETHAMINE 30 MG/ML IJ SOLN
30.0000 mg | Freq: Four times a day (QID) | INTRAMUSCULAR | Status: AC | PRN
  Filled 2023-09-27: qty 1

## 2023-09-27 MED ORDER — DIBUCAINE (PERIANAL) 1 % EX OINT
1.0000 | TOPICAL_OINTMENT | CUTANEOUS | Status: DC | PRN
Start: 2023-09-27 — End: 2023-09-29

## 2023-09-27 MED ORDER — EPHEDRINE 5 MG/ML INJ: 10.0000 mg | INTRAVENOUS | Status: DC | PRN

## 2023-09-27 MED ORDER — DIPHENHYDRAMINE HCL 25 MG PO CAPS: 25.0000 mg | ORAL_CAPSULE | Freq: Four times a day (QID) | ORAL | Status: DC | PRN

## 2023-09-27 MED ORDER — MORPHINE SULFATE (PF) 0.5 MG/ML IJ SOLN
INTRAMUSCULAR | Status: AC
Start: 2023-09-27 — End: 2023-09-27
  Filled 2023-09-27: qty 10

## 2023-09-27 MED ORDER — COCONUT OIL OIL
1.0000 | TOPICAL_OIL | Status: DC | PRN
Start: 2023-09-27 — End: 2023-09-29

## 2023-09-27 MED ORDER — ACETAMINOPHEN 10 MG/ML IV SOLN
INTRAVENOUS | Status: AC
  Filled 2023-09-27: qty 100

## 2023-09-27 MED ORDER — DIPHENHYDRAMINE HCL 25 MG PO CAPS: 25.0000 mg | ORAL_CAPSULE | ORAL | Status: DC | PRN

## 2023-09-27 MED ORDER — SOD CITRATE-CITRIC ACID 500-334 MG/5ML PO SOLN: 30.0000 mL | ORAL | Status: DC

## 2023-09-27 MED ORDER — DIPHENHYDRAMINE HCL 50 MG/ML IJ SOLN
12.5000 mg | INTRAMUSCULAR | Status: DC | PRN
Start: 2023-09-27 — End: 2023-09-29

## 2023-09-27 MED ORDER — KETOROLAC TROMETHAMINE 30 MG/ML IJ SOLN
30.0000 mg | Freq: Four times a day (QID) | INTRAMUSCULAR | Status: AC | PRN
  Administered 2023-09-27: 30 mg via INTRAVENOUS

## 2023-09-27 MED ORDER — LACTATED RINGERS IV SOLN
500.0000 mL | Freq: Once | INTRAVENOUS | Status: AC
  Administered 2023-09-27 (×2): 500 mL via INTRAVENOUS

## 2023-09-27 MED ORDER — TRANEXAMIC ACID-NACL 1000-0.7 MG/100ML-% IV SOLN
1000.0000 mg | Freq: Once | INTRAVENOUS | Status: AC
  Administered 2023-09-27: 1000 mg via INTRAVENOUS

## 2023-09-27 MED ORDER — NALOXONE HCL 0.4 MG/ML IJ SOLN: 0.4000 mg | INTRAMUSCULAR | Status: DC | PRN

## 2023-09-27 MED ORDER — SIMETHICONE 80 MG PO CHEW
80.0000 mg | CHEWABLE_TABLET | Freq: Three times a day (TID) | ORAL | Status: DC
  Administered 2023-09-27 – 2023-09-29 (×4): 80 mg via ORAL
  Filled 2023-09-27 (×4): qty 1

## 2023-09-27 MED ORDER — ONDANSETRON HCL 4 MG/2ML IJ SOLN
INTRAMUSCULAR | Status: DC | PRN
  Administered 2023-09-27: 4 mg via INTRAVENOUS

## 2023-09-27 MED ORDER — TRANEXAMIC ACID-NACL 1000-0.7 MG/100ML-% IV SOLN
INTRAVENOUS | Status: AC
  Filled 2023-09-27: qty 100

## 2023-09-27 MED ORDER — LACTATED RINGERS IV SOLN: INTRAVENOUS | Status: DC

## 2023-09-27 MED ORDER — PRENATAL MULTIVITAMIN CH
1.0000 | ORAL_TABLET | Freq: Every day | ORAL | Status: DC
  Administered 2023-09-28 – 2023-09-29 (×2): 1 via ORAL
  Filled 2023-09-27 (×2): qty 1

## 2023-09-27 MED ORDER — SODIUM CHLORIDE 0.9% FLUSH: 3.0000 mL | INTRAVENOUS | Status: DC | PRN

## 2023-09-27 MED ORDER — PHENYLEPHRINE 80 MCG/ML (10ML) SYRINGE FOR IV PUSH (FOR BLOOD PRESSURE SUPPORT)
PREFILLED_SYRINGE | INTRAVENOUS | Status: DC | PRN
  Administered 2023-09-27: 160 ug via INTRAVENOUS
  Administered 2023-09-27 (×2): 240 ug via INTRAVENOUS
  Administered 2023-09-27 (×2): 80 ug via INTRAVENOUS
  Administered 2023-09-27: 160 ug via INTRAVENOUS

## 2023-09-27 MED ORDER — SENNOSIDES-DOCUSATE SODIUM 8.6-50 MG PO TABS
2.0000 | ORAL_TABLET | Freq: Every evening | ORAL | Status: DC | PRN
  Administered 2023-09-28: 2 via ORAL
  Filled 2023-09-27 (×2): qty 2

## 2023-09-27 MED ORDER — DIPHENHYDRAMINE HCL 50 MG/ML IJ SOLN: 12.5000 mg | INTRAMUSCULAR | Status: DC | PRN

## 2023-09-27 MED ORDER — FENTANYL CITRATE (PF) 100 MCG/2ML IJ SOLN: 25.0000 ug | INTRAMUSCULAR | Status: DC | PRN

## 2023-09-27 MED ORDER — WITCH HAZEL-GLYCERIN EX PADS: 1.0000 | MEDICATED_PAD | CUTANEOUS | Status: DC | PRN

## 2023-09-27 MED ORDER — FENTANYL CITRATE (PF) 100 MCG/2ML IJ SOLN
INTRAMUSCULAR | Status: DC | PRN
  Administered 2023-09-27: 15 ug via INTRATHECAL

## 2023-09-27 MED ORDER — PHENYLEPHRINE HCL-NACL 20-0.9 MG/250ML-% IV SOLN
INTRAVENOUS | Status: DC | PRN
  Administered 2023-09-27: 60 ug/min via INTRAVENOUS

## 2023-09-27 MED ORDER — OXYTOCIN-SODIUM CHLORIDE 30-0.9 UT/500ML-% IV SOLN: 1.0000 m[IU]/min | INTRAVENOUS | Status: DC

## 2023-09-27 MED ORDER — KETOROLAC TROMETHAMINE 30 MG/ML IJ SOLN
INTRAMUSCULAR | Status: AC
  Filled 2023-09-27: qty 1

## 2023-09-27 MED ORDER — LIDOCAINE-EPINEPHRINE (PF) 2 %-1:200000 IJ SOLN
INTRAMUSCULAR | Status: AC
  Filled 2023-09-27: qty 20

## 2023-09-27 MED ORDER — TERBUTALINE SULFATE 1 MG/ML IJ SOLN: 0.2500 mg | Freq: Once | INTRAMUSCULAR | Status: DC | PRN

## 2023-09-27 MED ORDER — MENTHOL 3 MG MT LOZG
1.0000 | LOZENGE | OROMUCOSAL | Status: DC | PRN
Start: 2023-09-27 — End: 2023-09-29

## 2023-09-27 MED ORDER — KETOROLAC TROMETHAMINE 30 MG/ML IJ SOLN
INTRAMUSCULAR | Status: DC | PRN
  Administered 2023-09-27: 30 mg via INTRAVENOUS

## 2023-09-27 MED ORDER — DEXMEDETOMIDINE HCL IN NACL 80 MCG/20ML IV SOLN
INTRAVENOUS | Status: AC
  Filled 2023-09-27: qty 20

## 2023-09-27 MED ORDER — NALOXONE HCL 4 MG/10ML IJ SOLN: 1.0000 ug/kg/h | INTRAVENOUS | Status: DC | PRN

## 2023-09-27 MED ORDER — SODIUM CHLORIDE 0.9 % IV SOLN
500.0000 mg | INTRAVENOUS | Status: AC
  Administered 2023-09-27: 500 mg via INTRAVENOUS

## 2023-09-27 MED ORDER — OXYCODONE HCL 5 MG PO TABS
5.0000 mg | ORAL_TABLET | ORAL | Status: DC | PRN
  Administered 2023-09-29: 5 mg via ORAL
  Filled 2023-09-27: qty 1

## 2023-09-27 MED ORDER — ONDANSETRON HCL 4 MG/2ML IJ SOLN: 4.0000 mg | Freq: Three times a day (TID) | INTRAMUSCULAR | Status: DC | PRN

## 2023-09-27 MED ORDER — ACETAMINOPHEN 325 MG PO TABS
650.0000 mg | ORAL_TABLET | Freq: Four times a day (QID) | ORAL | Status: DC
  Administered 2023-09-27 – 2023-09-29 (×8): 650 mg via ORAL
  Filled 2023-09-27 (×8): qty 2

## 2023-09-27 SURGICAL SUPPLY — 28 items
BENZOIN TINCTURE PRP APPL 2/3 (GAUZE/BANDAGES/DRESSINGS) ×1 IMPLANT
CANISTER SUCT 3000ML PPV (MISCELLANEOUS) ×1 IMPLANT
CHLORAPREP W/TINT 26 (MISCELLANEOUS) ×2 IMPLANT
CLAMP UMBILICAL CORD (MISCELLANEOUS) ×1 IMPLANT
DRSG OPSITE POSTOP 4X10 (GAUZE/BANDAGES/DRESSINGS) ×1 IMPLANT
ELECTRODE REM PT RTRN 9FT ADLT (ELECTROSURGICAL) ×1 IMPLANT
EXTRACTOR VACUUM KIWI (MISCELLANEOUS) ×1 IMPLANT
GLOVE BIOGEL PI IND STRL 7.0 (GLOVE) ×2 IMPLANT
GLOVE BIOGEL PI IND STRL 7.5 (GLOVE) ×1 IMPLANT
GLOVE SURG SS PI 7.0 STRL IVOR (GLOVE) ×1 IMPLANT
GOWN STRL REUS W/ TWL LRG LVL3 (GOWN DISPOSABLE) ×2 IMPLANT
GOWN STRL REUS W/ TWL XL LVL3 (GOWN DISPOSABLE) ×1 IMPLANT
MAT PREVALON FULL STRYKER (MISCELLANEOUS) IMPLANT
NS IRRIG 1000ML POUR BTL (IV SOLUTION) ×1 IMPLANT
PACK C SECTION WH (CUSTOM PROCEDURE TRAY) ×1 IMPLANT
PAD ABD 7.5X8 STRL (GAUZE/BANDAGES/DRESSINGS) ×1 IMPLANT
PAD OB MATERNITY 4.3X12.25 (PERSONAL CARE ITEMS) ×1 IMPLANT
PAD PREP 24X48 CUFFED NSTRL (MISCELLANEOUS) ×1 IMPLANT
RETRACTOR TRAXI PANNICULUS (MISCELLANEOUS) IMPLANT
RETRACTOR WND ALEXIS 25 LRG (MISCELLANEOUS) ×1 IMPLANT
STRIP CLOSURE SKIN 1/2X4 (GAUZE/BANDAGES/DRESSINGS) ×1 IMPLANT
SUT MNCRL 0 VIOLET CTX 36 (SUTURE) ×2 IMPLANT
SUT MON AB 4-0 PS1 27 (SUTURE) ×1 IMPLANT
SUT VIC AB 0 CT1 36 (SUTURE) ×1 IMPLANT
SUT VICRYL+ 3-0 36IN CT-1 (SUTURE) ×1 IMPLANT
SUTURE PLAIN GUT 2.0 ETHICON (SUTURE) ×1 IMPLANT
TOWEL OR 17X24 6PK STRL BLUE (TOWEL DISPOSABLE) ×2 IMPLANT
WATER STERILE IRR 1000ML POUR (IV SOLUTION) ×1 IMPLANT

## 2023-09-27 NOTE — Progress Notes (Signed)
 LABOR PROGRESS NOTE  Patient Name: Courtney Spencer, female   DOB: Aug 25, 1995, 28 y.o.  MRN: [id number]  FB still firmly in place. Will add low dose pitocin  up to 6u.   Blood pressure 108/73, pulse 67, temperature (!) 97.5 F (36.4 C), temperature source Oral, resp. rate 14, height 4' 11 (1.499 m), weight 96 kg, last menstrual period 01/08/2023, SpO2 98%.  EFM: baseline 125, accels, no decels, moderate variability TOCO: q2.5-37min contractions  Start pitocin  2x2 cap at 6u Cat I Anticipate SVD  Mardy Shropshire, MD

## 2023-09-27 NOTE — Discharge Summary (Signed)
 Postpartum Discharge Summary  Date of Service updated***     Patient Name: Courtney Spencer DOB: [birth year] MRN: [id number]  Date of admission: 09/26/2023 Delivery date:09/27/2023 Delivering provider: IZELL HARARI Date of discharge: 09/27/2023  Admitting diagnosis: Pregnancy at 37/2. IOL for FGR. GDMA1. BMI 40s Intrauterine pregnancy: 106w3d      Additional problems: ***    Discharge diagnosis: {DX.:23714}                                              Post partum procedures:{Postpartum procedures:23558} Augmentation: Pitocin  and IP Foley and pitocin  Complications: unstable lie in labor  Hospital course: patient admitted and during the course of her labor, she was found to be breech at 4-5cm. Options d/w her and she elected for c-section and no ECV attempt. She had an uncomplicated PLTCS on 8/2.  Details of operation can be found in separate operative Note.  Patient had a postpartum course complicated by***.  Her POD#1 fasting CBG was *** She is ambulating, tolerating a regular diet, passing flatus, and urinating well.  Patient is discharged home in stable condition on 09/27/23.      Newborn Data: Birth date:09/27/2023 Birth time:7:45 AM Gender:Female Living status:Living Apgars:6 ,7  Weight:2420 g                               Magnesium  Sulfate received: {Mag received:30440022} BMZ received: No Rhophylac:N/A MMR:{MMR:30440033} T-DaP:{Tdap:23962} Flu: {Qol:76036} RSV Vaccine received: {RSV:31013} Transfusion:{Transfusion received:30440034} Immunizations administered: Immunization History  Administered Date(s) Administered   HPV Quadrivalent 02/11/2008, 01/28/2013, 07/27/2013   Influenza,inj,Quad PF,6+ Mos 03/27/2017   PFIZER(Purple Top)SARS-COV-2 Vaccination 12/24/2019, 01/14/2020   PPD Test 02/07/2015   Tdap 01/12/2009, 03/27/2017, 08/11/2023    Physical exam  Vitals:   09/27/23 0330 09/27/23 0430 09/27/23 0519 09/27/23 0701  BP: (!) 118/98 121/86 113/76  129/70  Pulse: (!) 118 99 (!) 114 (!) 124  Resp:      Temp:    97.7 F (36.5 C)  TempSrc:    Axillary  SpO2:      Weight:      Height:       General: {Exam; general:21111117} Lochia: {Desc; appropriate/inappropriate:30686::appropriate} Uterine Fundus: {Desc; firm/soft:30687} Incision: {Exam; incision:21111123} DVT Evaluation: {Exam; dvt:2111122} Labs: Lab Results  Component Value Date   WBC 11.5 (H) 09/26/2023   HGB 10.3 (L) 09/26/2023   HCT 30.8 (L) 09/26/2023   MCV 85.1 09/26/2023   PLT 308 09/26/2023      Latest Ref Rng & Units 02/22/2020   10:14 AM  CMP  Glucose 65 - 99 mg/dL 90   BUN 6 - 20 mg/dL 13   Creatinine 9.42 - 1.00 mg/dL 9.22   Sodium 865 - 855 mmol/L 137   Potassium 3.5 - 5.2 mmol/L 4.7   Chloride 96 - 106 mmol/L 102   CO2 20 - 29 mmol/L 20   Calcium 8.7 - 10.2 mg/dL 9.5   Total Protein 6.0 - 8.5 g/dL 6.9   Total Bilirubin 0.0 - 1.2 mg/dL 0.3   Alkaline Phos 44 - 121 IU/L 69   AST 0 - 40 IU/L 13   ALT 0 - 32 IU/L 20    Edinburgh Score:     No data to display            After  visit meds:  Allergies as of 09/27/2023       Reactions   Latex Other (See Comments)   Causes a burning sensation     Med Rec must be completed prior to using this [facility]***        Discharge home in stable condition Infant Feeding: {Baby feeding:23562} Infant Disposition:{CHL IP OB HOME WITH FNUYZM:76418} Discharge instruction: per After Visit Summary and Postpartum booklet. Activity: Advance as tolerated. Pelvic rest for 6 weeks.  Diet: {OB diet:21111121} Anticipated Birth Control: {Birth Control:23956} Postpartum Appointment:{Outpatient follow up:23559} Additional Postpartum F/U: {PP Procedure:23957} Future Appointments:No future appointments. Follow up Visit:  [X]  message sent to FT: 8/2    09/27/2023 Bebe Furry, MD

## 2023-09-27 NOTE — Lactation Note (Signed)
 This note was copied from a baby's chart. Lactation Consultation Note  Patient Name: Courtney Spencer Date: 09/27/2023 Age:29 hours Reason for consult: 1st time breastfeeding;Primapara;Early term 37-38.6wks;Infant < 6lbs  P1, 37 wks, @ 8 hrs of life. Baby sleepy StS with mom, encouraged mom this is very normal first day baby. Demonstrated hand expression for mom- several big drops expressed to baby. Infant accepts breast into his mouth, no active suckling. LC finished up with bottle. Encouraged mom to pump every 3 hours if baby not coming to breast. Per mom, she had tried pumping once, saw no milk. Encouraged mom that the pumping is important breast stimulation and brings in milk supply on-time.  Discussed expectations @ breast- Day 1- sleepy/ feed every 3 hrs/ even 10 minutes  is okay, Day 2 more awake/ feeding cues/longer feeds, and cluster feeding overnights brings milk in. LC services and milk storage shared. Encouraged mom to call for assist anytime desired.   LC reviewed low birth weight feeding plan as followed: -Whole feeding may not exceed 30 minutes total. -Breastfeed first for no longer than 15 minutes. -Offer formula for supplementation after for no longer than 15 minutes. -Pump to stimulate breasts for no longer than 15 minutes.  Maternal Data Has patient been taught Hand Expression?: Yes Does the patient have breastfeeding experience prior to this delivery?: No  Feeding Mother's Current Feeding Choice: Breast Milk and Formula  LATCH Score Latch: Too sleepy or reluctant, no latch achieved, no sucking elicited.  Audible Swallowing: None  Type of Nipple: Everted at rest and after stimulation  Comfort (Breast/Nipple): Soft / non-tender  Hold (Positioning): Full assist, staff holds infant at breast  LATCH Score: 4   Lactation Tools Discussed/Used    Interventions Interventions: Breast feeding basics reviewed;Hand express;Expressed milk;Education;LC  Services brochure;CDC milk storage guidelines  Discharge Pump: Personal;DEBP (Spectra , demonstrated pump kit parts that create a hand pump for mom)  Consult Status Consult Status: Follow-up Date: 09/28/23 Follow-up type: In-patient    Courtney Spencer 09/27/2023, 4:54 PM

## 2023-09-27 NOTE — Op Note (Signed)
 Operative Note   SURGERY DATE: 09/27/2023  PRE-OP DIAGNOSIS:  *Pregnancy at 37/3 *Breech, declines ECV *IOL for FGR *GDMA1  POST-OP DIAGNOSIS: Same. Delivered   PROCEDURE: primary low transverse cesarean section via Joel incision with single layer uterine closure  SURGEON: Surgeons and Role:    * Courtney Harari, MD - Primary  ASSISTANT:    Courtney Loyola Fish, MD  An experienced assistant was required given the standard of surgical care given the complexity of the case.  This assistant was needed for exposure, dissection, suctioning, retraction, instrument exchange, assisting with delivery with administration of fundal pressure, and for overall help during the procedure.  ANESTHESIA: spinal  ESTIMATED BLOOD LOSS:  DRAINS: UOP via indwelling foley  TOTAL IV FLUIDS: 2100mL crystalloid  VTE PROPHYLAXIS: SCDs to bilateral lower extremities  ANTIBIOTICS: Two grams of Cefazolin  were given., within 1 hour of skin incision, azithromycin  500mg  IV given intraoperatively  SPECIMENS: none  COMPLICATIONS: none  FINDINGS: No intra-abdominal adhesions were noted. Grossly normal uterus, tubes and ovaries. Clear amniotic fluid, Breech, back/bottom down,  female infant, weight 2420gm, APGARs 6/7, intact placenta. Arterial: pH 7.22/CO2 64/bicarb 26.2/A-B deficit 2.8 Venous: pH 7.26/CO2 53/bicarb 23.8/A-B deficit 3.9  PROCEDURE IN DETAIL: The patient was taken to the operating room where anesthesia was administered and normal fetal heart tones were confirmed. She was then prepped and draped in the normal fashion in the dorsal supine position with a leftward tilt.  After a time out was performed, a Marty Schneider incision was made and the peritoneum was entered bluntly. The bladder blade was inserted and the vesicouterine peritoneum was identified.  A low transverse hysterotomy was made with the scalpel until the endometrial cavity was breached and the amniotic sac ruptured, yielding clear  amniotic fluid. This incision was extended bluntly and the infant's bottom and legs delivered and then delivered via the standard breech maneuvers.The cord was clamped x 2 and cut, and the infant was handed to the awaiting pediatricians, after delayed cord clamping was not done.  The placenta was then gradually expressed from the uterus and then the uterus was exteriorized and cleared of all clots and debris. The hysterotomy was repaired with a running suture of 1-0 monocryl. to achieve excellent hemostasis.   The uterus and adnexa were then returned to the abdomen, and the hysterotomy and all operative sites were reinspected and excellent hemostasis was noted after irrigation and suction of the abdomen with warm saline.  The peritoneum was closed with a running stitch of 3-0 Vicryl. The fascia was reapproximated with 0 Vicryl in a simple running fashion bilaterally. The subcutaneous layer was then reapproximated with interrupted sutures of 2-0 plain gut, and the skin was then closed with 4-0 monocryl, in a subcuticular fashion.  The patient  tolerated the procedure well. Sponge, lap, needle, and instrument counts were correct x 2. The patient was transferred to the recovery room awake, alert and breathing independently in stable condition.  Spencer Courtney Overcast MD Attending Center for [facility])

## 2023-09-27 NOTE — Anesthesia Procedure Notes (Signed)
 Spinal  Patient location during procedure: OR Start time: 09/27/2023 7:14 AM End time: 09/27/2023 7:21 AM Reason for block: surgical anesthesia Staffing Performed: anesthesiologist  Anesthesiologist: Jerrye Sharper, MD Performed by: Jerrye Sharper, MD Authorized by: Jerrye Sharper, MD   Preanesthetic Checklist Completed: patient identified, IV checked, site marked, risks and benefits discussed, surgical consent, monitors and equipment checked, pre-op evaluation and timeout performed Spinal Block Patient position: sitting Prep: DuraPrep and site prepped and draped Patient monitoring: heart rate, cardiac monitor, continuous pulse ox and blood pressure Approach: midline Location: L3-4 Injection technique: single-shot Needle Needle type: Tuohy and Spinocan  Needle gauge: 24 G Needle length: 9 cm Needle insertion depth: 9 cm Assessment Sensory level: T4 Events: CSF return Additional Notes Patient tolerated procedure well. Adequate sensory level. Attempt x 3. Technically difficult due to MO and lack of palpable landmarks.

## 2023-09-27 NOTE — Transfer of Care (Signed)
 Immediate Anesthesia Transfer of Care Note  Patient: Courtney Spencer  Procedure(s) Performed: CESAREAN DELIVERY  Patient Location: PACU  Anesthesia Type:Spinal  Level of Consciousness: awake, alert , and oriented  Airway & Oxygen Therapy: Patient Spontanous Breathing  Post-op Assessment: Report given to RN and Post -op Vital signs reviewed and stable  Post vital signs: Reviewed and stable  Last Vitals:  Vitals Value Taken Time  BP 102/84 09/27/23 08:31  Temp    Pulse 120 09/27/23 08:36  Resp 31 09/27/23 08:36  SpO2 100 % 09/27/23 08:36  Vitals shown include unfiled device data.  Last Pain:  Vitals:   09/27/23 0701  TempSrc: Axillary  PainSc:          Complications: No notable events documented.

## 2023-09-27 NOTE — Lactation Note (Signed)
 This note was copied from a baby's chart. Lactation Consultation Note  Patient Name: Courtney Spencer Unijb'd Date: 09/27/2023 Age:29 hours Reason for consult: Follow-up assessment;Primapara;1st time breastfeeding;Early term 37-38.6wks;Infant < 6lbs;Maternal endocrine disorder;Breastfeeding assistance (endometriosis)  P1- RN requested for LC to feed infant because infant's last feeding was poor. Infant was placed on the right breast in the cross cradle hold. After a few attempts, infant was able to latch but was not interested in sucking. LC reassured MOB that this is normal behavior. LC encouraged hand expressing to offer EBM. We were able to collect 1 mL and spoon fed it to infant. LC then demonstrated pace feeding and MOB was able to take over. Infant took a total of 10 mL before becoming fatigued. LC reiterated the low birth weight feeding plan and the volumes that are reccommended. LC encouraged MOB to call for further assistance as needed.  Maternal Data Has patient been taught Hand Expression?: Yes Does the patient have breastfeeding experience prior to this delivery?: No  Feeding Mother's Current Feeding Choice: Breast Milk and Formula Nipple Type: Slow - flow  LATCH Score Latch: Too sleepy or reluctant, no latch achieved, no sucking elicited.  Audible Swallowing: None  Type of Nipple: Everted at rest and after stimulation  Comfort (Breast/Nipple): Soft / non-tender  Hold (Positioning): Full assist, staff holds infant at breast  LATCH Score: 4   Lactation Tools Discussed/Used Tools: Pump;Flanges Flange Size: 18 Breast pump type: Double-Electric Breast Pump;Manual Pump Education: Setup, frequency, and cleaning;Milk Storage Reason for Pumping: low birth weight infant Pumping frequency: 15-20 min every 3 hrs  Interventions Interventions: Breast feeding basics reviewed;Assisted with latch;Hand express;Breast compression;Adjust position;Support pillows;Position  options;Expressed milk;Hand pump;DEBP;Education;Pace feeding;LC Services brochure;LPT handout/interventions  Discharge Discharge Education: Engorgement and breast care;Warning signs for feeding baby Pump: DEBP;Personal;Manual  Consult Status Consult Status: Follow-up Date: 09/28/23 Follow-up type: In-patient    Recardo Hoit BS, IBCLC 09/27/2023, 8:25 PM

## 2023-09-27 NOTE — Progress Notes (Signed)
 LABOR PROGRESS NOTE  Patient Name: Courtney Spencer, female   DOB: [birth year], 28 y.o.  MRN: [id number]  FB out.   R/B/A of AROM discussed with patient, and verbal consent obtained.   Blood pressure 103/74, pulse 82, temperature (!) 97.5 F (36.4 C), temperature source Oral, resp. rate 14, height 4' 11 (1.499 m), weight 96 kg, last menstrual period 01/08/2023, SpO2 98%.  Dilation: 4 Effacement (%): 80 Cervical Position: Posterior Station: -2 Presentation: Vertex Exam by:: Dr. Loyola  Babe's head found to be ballotable.   Will start pitocin  2x2 to facilitate improved station and reassess for AROM in a few hours.   Cat I. Ctx q2.5-67min  Mardy Loyola, MD

## 2023-09-27 NOTE — Discharge Instructions (Signed)
°Cesarean Delivery, Care After °Refer to this sheet in the next few weeks. These instructions provide you with information on caring for yourself after your procedure. Your health care provider may also give you specific instructions. Your treatment has been planned according to current medical practices, but problems sometimes occur. Call your health care provider if you have any problems or questions after you go home. °HOME CARE INSTRUCTIONS  °· Only take over-the-counter or prescription medications as directed by your health care provider. °· Do not drink alcohol, especially if you are breastfeeding or taking medication to relieve pain. °· Do not  smoke tobacco. °· Continue to use good perineal care. Good perineal care includes: °¨ Wiping your perineum from front to back. °¨ Keeping your perineum clean. °· Check your surgical cut (incision) daily for increased redness, drainage, swelling, or separation of skin. °· Shower and clean your incision gently with soap and water every day, by letting warm and soapy water run over the incision, and then pat it dry. If your health care provider says it is okay, leave the incision uncovered. Use a bandage (dressing) if the incision is draining fluid or appears irritated. If the adhesive strips across the incision do not fall off within 7 days, carefully peel them off, after a shower. °· Hug a pillow when coughing or sneezing until your incision is healed. This helps to relieve pain. °· Do not use tampons, douches or have sexual intercourse, until your health care provider says it is okay. °· Wear a well-fitting bra that provides breast support. °· Limit wearing support panties or control-top hose. °· Drink enough fluids to keep your urine clear or pale yellow. °· Eat high-fiber foods such as whole grain cereals and breads, brown rice, beans, and fresh fruits and vegetables every day. These foods may help prevent or relieve constipation. °· Resume activities such as  climbing stairs, driving, lifting, exercising, or traveling as directed by your health care provider. °· Try to have someone help you with your household activities and your newborn for at least a few days after you leave the hospital. °· Rest as much as possible. Try to rest or take a nap when your newborn is sleeping. °· Increase your activities gradually. °· Do not lift more than 15lbs until directed by a provider. °· Keep all of your scheduled postpartum appointments. It is very important to keep your scheduled follow-up appointments. At these appointments, your health care provider will be checking to make sure that you are healing physically and emotionally. °SEEK MEDICAL CARE IF:  °· You are passing large clots from your vagina. Save any clots to show your health care provider. °· You have a foul smelling discharge from your vagina. °· You have trouble urinating. °· You are urinating frequently. °· You have pain when you urinate. °· You have a change in your bowel movements. °· You have increasing redness, pain, or swelling near your incision. °· You have pus draining from your incision. °· Your incision is separating. °· You have painful, hard, or reddened breasts. °· You have a severe headache. °· You have blurred vision or see spots. °· You feel sad or depressed. °· You have thoughts of hurting yourself or your newborn. °· You have questions about your care, the care of your newborn, or medications. °· You are dizzy or light-headed. °· You have a rash. °· You have pain, redness, or swelling at the site of the removed intravenous access (IV) tube. °· You have nausea   or vomiting. °· You stopped breastfeeding and have not had a menstrual period within 12 weeks of stopping. °· You are not breastfeeding and have not had a menstrual period within 12 weeks of delivery. °· You have a fever. °SEEK IMMEDIATE MEDICAL CARE IF: °· You have persistent pain. °· You have chest pain. °· You have shortness of breath. °· You  faint. °· You have leg pain. °· You have stomach pain. °· Your vaginal bleeding saturates 2 or more sanitary pads in 1 hour. °MAKE SURE YOU:  °· Understand these instructions. °· Will watch your condition. °· Will get help right away if you are not doing well or get worse. °Document Released: 11/03/2001 Document Revised: 06/28/2013 Document Reviewed: 10/09/2011 °ExitCare® Patient Information ©2015 [employer]. This information is not intended to replace advice given to you by your health care provider. Make sure you discuss any questions you have with your health care provider. ° ° °

## 2023-09-27 NOTE — Anesthesia Postprocedure Evaluation (Signed)
 Anesthesia Post Note  Patient: Courtney Spencer  Procedure(s) Performed: CESAREAN DELIVERY     Patient location during evaluation: PACU Anesthesia Type: Spinal Level of consciousness: oriented and awake and alert Pain management: pain level controlled Vital Signs Assessment: post-procedure vital signs reviewed and stable Respiratory status: spontaneous breathing, respiratory function stable and nonlabored ventilation Cardiovascular status: blood pressure returned to baseline and stable Postop Assessment: no headache, no backache, no apparent nausea or vomiting and spinal receding Anesthetic complications: no   No notable events documented.  Last Vitals:  Vitals:   09/27/23 0830 09/27/23 0845  BP: 102/84 (!) 100/55  Pulse: (!) 116 (!) 116  Resp: (!) 31 (!) 35  Temp: (!) 36.2 C   SpO2: 99% 100%    Last Pain:  Vitals:   09/27/23 0830  TempSrc: Temporal  PainSc: 0-No pain   Pain Goal:    LLE Motor Response: Purposeful movement (09/27/23 0830) LLE Sensation: Tingling (09/27/23 0830) RLE Motor Response: Purposeful movement (09/27/23 0830) RLE Sensation: Tingling (09/27/23 0830)     Epidural/Spinal Function Cutaneous sensation: Tingles (09/27/23 0830), Patient able to flex knees: No (09/27/23 0830), Patient able to lift hips off bed: No (09/27/23 0830), Back pain beyond tenderness at insertion site: No (09/27/23 0830), Progressively worsening motor and/or sensory loss: No (09/27/23 0830), Bowel and/or bladder incontinence post epidural: No (09/27/23 0830)  Armetta Henri A.

## 2023-09-27 NOTE — Progress Notes (Addendum)
 L&D Note  09/27/2023 - 6:33 AM  28 y.o. G2P0010 [redacted]w[redacted]d. Pregnancy complicated by:  Patient Active Problem List   Diagnosis Date Noted   Fetal growth restriction antepartum 09/23/2023   Gestational diabetes 07/28/2023   Rubella non-immune status, antepartum 04/17/2023   Supervision of high risk pregnancy, antepartum 04/11/2023   Allergic rhinitis due to pollen 06/09/2020   Excessive sweating, local 11/21/2016   Migraines with aura 08/19/2016   Irritable bowel syndrome with diarrhea 02/09/2015   Vitamin D  deficiency 05/05/2014   Anxiety, generalized 11/23/2013   Concentration deficit 11/23/2013   Stress incontinence in female 11/23/2013   Gastroesophageal reflux disease without esophagitis 10/26/2013   History of Helicobacter pylori infection 10/26/2013   Intractable migraine without aura and without status migrainosus 10/26/2013   Kidney stones 10/26/2013    [name] is admitted for IOL for FGR  As I was going to c-section, patient checked and felt to be breech. SVE 4-5cm and terbutaline  given and pitocin  stopped   Subjective:  Patient with mild to moderate distress with UCs  Objective:     Current Vital Signs 24h Vital Sign Ranges  T 97.8 F (36.6 C) Temp  Avg: 98 F (36.7 C)  Min: 97.5 F (36.4 C)  Max: 98.3 F (36.8 C)  BP 113/76 BP  Min: 93/47  Max: 127/86  HR (!) 114 Pulse  Avg: 84.1  Min: 61  Max: 118  RR 14 Resp  Avg: 16.4  Min: 14  Max: 18  SaO2 98 % Room Air SpO2  Avg: 98 %  Min: 98 %  Max: 98 %       24 Hour I/O Current Shift I/O  Time Ins Outs No intake/output data recorded. No intake/output data recorded.   FHR: 135 baseline, +accels, occasional variables to 100s, mod variability Toco: q5-71m Gen: NAD SVE: 5/90/BBOW  Bedside u/s and fetal head in RUQ, back to right, complete breech, normal AFI and FHR, +FM  Labs:  Recent Labs  Lab 09/26/23 0020  WBC 11.5*  HGB 10.3*  HCT 30.8*  PLT 308   No results for input(s): NA, K,  CL, CO2, BUN, CREATININE, CALCIUM, PROT, BILITOT, ALKPHOS, ALT, AST, GLUCOSE in the last 168 hours.  Invalid input(s): LABALBU  Medications Current Facility-Administered Medications  Medication Dose Route Frequency Provider Last Rate Last Admin   acetaminophen  (TYLENOL ) tablet 650 mg  650 mg Oral Q4H PRN Ozan, Jennifer, DO       azithromycin  (ZITHROMAX ) 500 mg in sodium chloride  0.9 % 250 mL IVPB  500 mg Intravenous 60 min Pre-Op Izell Harari, MD       ceFAZolin  (ANCEF ) IVPB 2g/100 mL premix  2 g Intravenous 30 min Pre-Op Izell Harari, MD       diphenhydrAMINE  (BENADRYL ) injection 12.5 mg  12.5 mg Intravenous Q15 min PRN Jefm Garnette LABOR, MD       ePHEDrine  injection 10 mg  10 mg Intravenous PRN Jefm Garnette LABOR, MD       ePHEDrine  injection 10 mg  10 mg Intravenous PRN Jefm Garnette LABOR, MD       fentaNYL  (SUBLIMAZE ) injection 50-100 mcg  50-100 mcg Intravenous Q1H PRN Ozan, Jennifer, DO       fentaNYL  2 mcg/mL w/ bupivacaine  0.125% in NS 250 mL epidural infusion  12 mL/hr Epidural Continuous PRN Jefm Garnette LABOR, MD       lactated ringers  infusion   Intravenous Continuous Leveque, Alyssa, MD       lidocaine  (  PF) (XYLOCAINE ) 1 % injection 30 mL  30 mL Subcutaneous PRN Ozan, Jennifer, DO       misoprostol  (CYTOTEC ) tablet 25 mcg  25 mcg Buccal Q4H Walker, Jamilla R, CNM   25 mcg at 09/26/23 1530   ondansetron  (ZOFRAN ) injection 4 mg  4 mg Intravenous Q6H PRN Ozan, Jennifer, DO       oxyCODONE -acetaminophen  (PERCOCET/ROXICET) 5-325 MG per tablet 1 tablet  1 tablet Oral Q4H PRN Ozan, Jennifer, DO       oxyCODONE -acetaminophen  (PERCOCET/ROXICET) 5-325 MG per tablet 2 tablet  2 tablet Oral Q4H PRN Ozan, Jennifer, DO       oxytocin  (PITOCIN ) IV BOLUS FROM BAG  333 mL Intravenous Once Ozan, Jennifer, DO       oxytocin  (PITOCIN ) IV infusion 30 units in NS 500 mL - Premix  2.5 Units/hr Intravenous Continuous Ozan, Jennifer, DO       oxytocin  (PITOCIN ) IV infusion 30  units in NS 500 mL - Premix  1-40 milli-units/min Intravenous Titrated Leveque, Alyssa, MD   Stopped at 09/27/23 0513   PHENYLephrine  80 mcg/ml in normal saline Adult IV Push Syringe (For Blood Pressure Support)  80-160 mcg Intravenous PRN Houser, Stephen A, MD       PHENYLephrine  80 mcg/ml in normal saline Adult IV Push Syringe (For Blood Pressure Support)  80 mcg Intravenous PRN Jefm Garnette LABOR, MD       sodium citrate -citric acid  (ORACIT) solution 30 mL  30 mL Oral Q2H PRN Ozan, Jennifer, DO       sodium citrate -citric acid  (ORACIT) solution 30 mL  30 mL Oral 30 min Pre-Op Izell Harari, MD       tranexamic acid  (CYKLOKAPRON ) IVPB 1,000 mg  1,000 mg Intravenous Once Izell Harari, MD       CBG at [phone]  Assessment & Plan:  Patient stable *Breech: d/w her re: ECV vs primary c-section and she elects for c-section. OR aware. Will give another dose of terb *Analgesia: nothing in place, currently.   Harari Izell Overcast MD Attending Center for [facility])

## 2023-09-27 NOTE — Anesthesia Preprocedure Evaluation (Addendum)
 Anesthesia Evaluation  Patient identified by MRN, date of birth, ID band Patient awake    Reviewed: Allergy & Precautions, NPO status , Patient's Chart, lab work & pertinent test results  Airway Mallampati: II  TM Distance: >3 FB     Dental  (+) Teeth Intact, Dental Advisory Given   Pulmonary neg pulmonary ROS   Pulmonary exam normal breath sounds clear to auscultation       Cardiovascular Normal cardiovascular exam+ Valvular Problems/Murmurs  Rhythm:Regular Rate:Normal     Neuro/Psych  Headaches PSYCHIATRIC DISORDERS Anxiety Depression       GI/Hepatic ,GERD  ,,  Endo/Other  diabetes, Well Controlled, Gestational    Renal/GU Renal diseaseNephrolithiasis      Musculoskeletal negative musculoskeletal ROS (+)    Abdominal  (+) + obese  Peds  Hematology  (+) Blood dyscrasia, anemia Lab Results      Component                Value               Date                      WBC                      11.5 (H)            09/26/2023                HGB                      10.3 (L)            09/26/2023                HCT                      30.8 (L)            09/26/2023                MCV                      85.1                09/26/2023                PLT                      308                 09/26/2023              Anesthesia Other Findings Alll: latex  Reproductive/Obstetrics (+) Pregnancy Transverse lie                              Anesthesia Physical Anesthesia Plan  ASA: 3 and emergent  Anesthesia Plan: Spinal   Post-op Pain Management: Minimal or no pain anticipated and Regional block*   Induction:   PONV Risk Score and Plan: 4 or greater and Treatment may vary due to age or medical condition  Airway Management Planned: Natural Airway  Additional Equipment: None and Fetal Monitoring  Intra-op Plan:   Post-operative Plan:   Informed Consent: I have reviewed the patients  History and Physical, chart, labs and discussed the procedure including the risks, benefits and alternatives for  the proposed anesthesia with the patient or authorized representative who has indicated his/her understanding and acceptance.     Dental advisory given  Plan Discussed with: Anesthesiologist and CRNA  Anesthesia Plan Comments: (37.3 wk G2P0 w gDm  and tranverse lie for primary c/s )         Anesthesia Quick Evaluation

## 2023-09-28 ENCOUNTER — Encounter (HOSPITAL_COMMUNITY): Payer: Self-pay | Admitting: Obstetrics and Gynecology

## 2023-09-28 LAB — CBC
HCT: 24.6 % — ABNORMAL LOW (ref 36.0–46.0)
Hemoglobin: 8.1 g/dL — ABNORMAL LOW (ref 12.0–15.0)
MCH: 28.8 pg (ref 26.0–34.0)
MCHC: 32.9 g/dL (ref 30.0–36.0)
MCV: 87.5 fL (ref 80.0–100.0)
Platelets: 266 K/uL (ref 150–400)
RBC: 2.81 MIL/uL — ABNORMAL LOW (ref 3.87–5.11)
RDW: 14.4 % (ref 11.5–15.5)
WBC: 12.3 K/uL — ABNORMAL HIGH (ref 4.0–10.5)
nRBC: 0 % (ref 0.0–0.2)

## 2023-09-28 LAB — CREATININE, SERUM
Creatinine, Ser: 0.6 mg/dL (ref 0.44–1.00)
GFR, Estimated: >60 mL/min (ref >60)

## 2023-09-28 LAB — GLUCOSE, CAPILLARY: Glucose-Capillary: 74 mg/dL (ref 70–99)

## 2023-09-28 LAB — BIRTH TISSUE RECOVERY COLLECTION (PLACENTA DONATION)

## 2023-09-28 MED ORDER — FERROUS SULFATE 325 (65 FE) MG PO TABS
325.0000 mg | ORAL_TABLET | ORAL | Status: DC
  Administered 2023-09-28: 325 mg via ORAL
  Filled 2023-09-28: qty 1

## 2023-09-28 MED ORDER — WHITE PETROLATUM EX OINT: 1.0000 | TOPICAL_OINTMENT | CUTANEOUS | Status: DC | PRN

## 2023-09-28 MED ORDER — SUCROSE 24% NICU/PEDS ORAL SOLUTION: 0.5000 mL | OROMUCOSAL | Status: DC | PRN

## 2023-09-28 MED ORDER — LIDOCAINE 1% INJECTION FOR CIRCUMCISION
0.8000 mL | INJECTION | Freq: Once | INTRAVENOUS | Status: DC
  Filled 2023-09-28: qty 1

## 2023-09-28 MED ORDER — EPINEPHRINE TOPICAL FOR CIRCUMCISION 0.1 MG/ML
1.0000 [drp] | TOPICAL | Status: DC | PRN
Start: 2023-09-28 — End: 2023-09-28

## 2023-09-28 MED ORDER — GELATIN ABSORBABLE 12-7 MM EX MISC: 1.0000 | Freq: Once | CUTANEOUS | Status: DC | PRN

## 2023-09-28 NOTE — Progress Notes (Signed)
 POSTPARTUM PROGRESS NOTE  POD #1  Subjective:  Courtney Spencer is a 28 y.o. G2P1011 s/p pLTCS at [redacted]w[redacted]d. Today she notes she is doing well. She denies any problems with ambulating, voiding or po intake. Denies nausea or vomiting. She has passed flatus,+ BM.  Pain is well controlled.  Lochia appropriate Denies fever/chills/chest pain/SOB.  no HA, no blurry vision, no RUQ pain  Objective: Blood pressure (!) 87/58, pulse 74, temperature 97.8 F (36.6 C), temperature source Oral, resp. rate 20, height 4' 11 (1.499 m), weight 96 kg, last menstrual period 01/08/2023, SpO2 98%, unknown if currently breastfeeding.  Physical Exam:  General: alert, cooperative and no distress Chest: no respiratory distress Heart: regular rate and rhythm Abdomen: soft, nontender, +BS Uterine Fundus: firm, appropriately tender Incision: C/D/I with honeycomb DVT Evaluation: No calf swelling or tenderness Extremities: minimal edema Skin: warm, dry  Results for orders placed or performed during the hospital encounter of 09/26/23 (from the past 24 hours)  CBC     Status: Abnormal   Collection Time: 09/28/23  5:03 AM  Result Value Ref Range   WBC 12.3 (H) 4.0 - 10.5 K/uL   RBC 2.81 (L) 3.87 - 5.11 MIL/uL   Hemoglobin 8.1 (L) 12.0 - 15.0 g/dL   HCT 75.3 (L) 63.9 - 53.9 %   MCV 87.5 80.0 - 100.0 fL   MCH 28.8 26.0 - 34.0 pg   MCHC 32.9 30.0 - 36.0 g/dL   RDW 85.5 88.4 - 84.4 %   Platelets 266 150 - 400 K/uL   nRBC 0.0 0.0 - 0.2 %  Creatinine, serum     Status: None   Collection Time: 09/28/23  5:03 AM  Result Value Ref Range   Creatinine, Ser 0.60 0.44 - 1.00 mg/dL   GFR, Estimated >39 >39 mL/min  Glucose, capillary     Status: None   Collection Time: 09/28/23  5:05 AM  Result Value Ref Range   Glucose-Capillary 74 70 - 99 mg/dL    Assessment/Plan: Courtney Spencer is a 28 y.o. G2P1011 s/p pLTCS at [redacted]w[redacted]d POD#1   -pain well controlled -meeting milestones appropriately -GDMA1-  normal glucose this am - Desires SAR, informed consent obtained and placed in baby's chart  Contraception: NFP Feeding: breastfeeding  Dispo: Continue routine postoperative care.  Pending baby status, possible discharge home tomorrow   LOS: 2 days   Courtney Benfer, DO Faculty Attending, Center for [facility] 09/28/2023, 10:45 AM

## 2023-09-28 NOTE — Lactation Note (Signed)
 This note was copied from a baby's chart. Lactation Consultation Note  Patient Name: Courtney Spencer Date: 09/28/2023 Age:29 hours Reason for consult: Follow-up assessment;Primapara;1st time breastfeeding;Early term 37-38.6wks;Infant < 6lbs;Maternal endocrine disorder  P1- Infant was born at [redacted]w[redacted]d GA weighing [phone] with a total weight loss of 1.83%. MOB reports that infant has started cluster feeding and is becoming frustrated with MOB's breast flow. LC reviewed how this can be normal when offering the bottle as well. LC reviewed how often infant will be less frustrated when MOB's milk comes in. [facility] encouraged MOB to continue offering the breast first. MOB reports that she has been pumping and she is now seeing volume. MOB was able to collect 5 mL with her last pumping session. LC praised MOB and encouraged her to offer her EBM first with next feeding, then follow up with the formula. MOB denies having any questions or concerns at this time. LC reviewed the feeding guidelines for day 2 and encouraged her to call for further assistance as needed.  Maternal Data Has patient been taught Hand Expression?: Yes Does the patient have breastfeeding experience prior to this delivery?: No  Feeding Mother's Current Feeding Choice: Breast Milk and Formula Nipple Type: Slow - flow  Lactation Tools Discussed/Used Tools: Pump;Flanges Flange Size: 18 Breast pump type: Double-Electric Breast Pump;Manual Pump Education: Setup, frequency, and cleaning;Milk Storage Reason for Pumping: low birth weight infant Pumping frequency: 15-20 min every 3 hrs Pumped volume: 5 mL  Interventions Interventions: Breast feeding basics reviewed;Hand pump;DEBP;Education;Pace feeding;LC Services brochure;LPT handout/interventions  Discharge Discharge Education: Engorgement and breast care;Warning signs for feeding baby Pump: DEBP;Manual;Personal  Consult Status Consult Status: Follow-up Date:  09/29/23 Follow-up type: In-patient    Recardo Hoit BS, IBCLC 09/28/2023, 7:59 PM

## 2023-09-28 NOTE — Progress Notes (Signed)
 CSW received consult for hx of Anxiety and Depression. CSW met with MOB to offer support and complete assessment. When CSW entered room, MOB was observed sitting on couch beside FOB. Visitors were present. Infant was in central nursery being circumcised. CSW introduced self and requested to speak with MOB alone. Visitors left room, FOB remained. MOB provided verbal consent for CSW to continue with FOB present. CSW explained reason for visit. MOB presented as calm, agreeable to consult, and remained engaged during encounter.   CSW inquired how MOB is feeling emotionally since infant's arrival. MOB reports feeling really happy. CSW inquired about MOB's mental health history. MOB acknowledged a history of depression and anxiety, reporting she was diagnosed initially in [employer]. CSW inquired about MOB's mental health during pregnancy. MOB shared she noticed some moments of anxiety centered on feeling worried about the baby, such as when he didn't seem to be moving as much as usual but denied significant mental health symptoms during pregnancy. MOB reported she felt she was able to cope by talking with FOB and her family, which she identified as her supports. MOB reports she is not prescribed mental health medication or current with a therapist. MOB declined mental health resources at this time. CSW assessed for safety. MOB denied current SI/HI.   CSW provided education regarding the baby blues period vs. perinatal mood disorders, discussed treatment and gave resources for mental health follow up if concerns arise.  CSW recommends self-evaluation during the postpartum time period using the New Mom Checklist from Postpartum Progress and encouraged MOB to contact a medical professional if symptoms are noted at any time.    MOB reports she has all needed items for infant, including a car seat and bassinet. MOB has chosen [employer] for infant's follow up care.  CSW provided review of Sudden Infant  Death Syndrome (SIDS) precautions.    CSW identifies no further need for intervention and no barriers to discharge at this time.  Signed,  Sharyne LOIS Roulette, MSW, LCSWA, LCASA 09/28/2023 4:25 PM

## 2023-09-29 ENCOUNTER — Other Ambulatory Visit (HOSPITAL_COMMUNITY): Payer: Self-pay

## 2023-09-29 MED ORDER — OXYCODONE HCL 5 MG PO TABS
5.0000 mg | ORAL_TABLET | ORAL | 0 refills | Status: DC | PRN
  Filled 2023-09-29: qty 30, 5d supply, fill #0

## 2023-09-29 MED ORDER — ACETAMINOPHEN 500 MG PO TABS
1000.0000 mg | ORAL_TABLET | Freq: Four times a day (QID) | ORAL | 0 refills | Status: DC | PRN
  Filled 2023-09-29: qty 60, 8d supply, fill #0

## 2023-09-29 MED ORDER — FERROUS SULFATE 325 (65 FE) MG PO TABS
325.0000 mg | ORAL_TABLET | ORAL | 0 refills | Status: DC
  Filled 2023-09-29: qty 30, 60d supply, fill #0

## 2023-09-29 MED ORDER — IBUPROFEN 600 MG PO TABS
600.0000 mg | ORAL_TABLET | Freq: Four times a day (QID) | ORAL | 0 refills | Status: DC | PRN
  Filled 2023-09-29: qty 30, 8d supply, fill #0

## 2023-09-29 MED ORDER — SENNOSIDES-DOCUSATE SODIUM 8.6-50 MG PO TABS
2.0000 | ORAL_TABLET | Freq: Every evening | ORAL | 0 refills | Status: DC | PRN
  Filled 2023-09-29: qty 30, 15d supply, fill #0

## 2023-09-29 NOTE — Patient Instructions (Signed)
 Your appointment with Outpatient Lactation is: Date:10/17/2023 Time: 11:30 AM [facility] (First Floor) [address] [place]  Check in under baby's name.  Please bring your baby hungry along with your pump and a bottle of either formula or expressed breast milk. Please also bring your pump flanges and we welcome support people! If you need lactation assistance before your appointment, please call [phone] and press 4 for lactation.   If interested in an outpatient lactation consult in office or virtually please reach out to us  at [facility] (First Floor) [address] [place] Please call [phone] and press 4 for lactation.    Lactation support groups:  [facility], Tuesdays 10:00 am -12:00 pm at [address] on the second floor in the conference room, lactating parents and lap babies welcome.  [email or web address]  [email or web address]   Geraldina Louder, [facility]

## 2023-09-29 NOTE — Lactation Note (Signed)
 This note was copied from a baby's chart. Lactation Consultation Note  Patient Name: Courtney Spencer Unijb'd Date: 09/29/2023 Age:29 hours Reason for consult: Follow-up assessment;Primapara;1st time breastfeeding;Early term 37-38.6wks;Maternal endocrine disorder;Infant weight loss As LC entered the room mom was pumping with the DEBP both breast with EBM yield. Per mom the most she has pumped off is 10 ml . LC praised per for pumping . Per mom has pumped a few times after the baby has fed.  LC recommended since the baby is less than 6 pounds,  Offer the 1st breast 15 -20 mins , supplement working up tp 30 ml pace feeding and post pump both breast for 15 mins . Next feeding switch to the other breast and do the same.  LC reviewed engorgement prevention and tx, supply and demand , storage of breast milk,  Importance of feeding her baby by 3 hours around the clock.  Mom already has a LC O/P appt on 10/17/23 11:30 am at [facility]  Mom is aware.   Maternal Data Does the patient have breastfeeding experience prior to this delivery?: No  Feeding Mother's Current Feeding Choice: Breast Milk and Formula   Lactation Tools Discussed/Used Tools: Pump;Flanges Flange Size: 18 Breast pump type: Manual;Double-Electric Breast Pump Pump Education: Setup, frequency, and cleaning;Milk Storage Pumped volume: 10 mL  Interventions  Education , storage of breast milk and resources.   Discharge Discharge Education: Engorgement and breast care;Warning signs for feeding baby;Outpatient recommendation (LC O/P was in today to schedule appt - see progress note) Pump: Personal;DEBP;Manual  Consult Status Consult Status: Complete Date: 09/29/23    Rollene Caldron Darrelyn Morro 09/29/2023, 1:16 PM

## 2023-10-01 ENCOUNTER — Encounter: Admitting: Advanced Practice Midwife

## 2023-10-07 ENCOUNTER — Ambulatory Visit: Admitting: Women's Health

## 2023-10-07 ENCOUNTER — Encounter: Admitting: Women's Health

## 2023-10-07 ENCOUNTER — Encounter: Payer: Self-pay | Admitting: Women's Health

## 2023-10-07 VITALS — BP 107/73 | HR 97 | Ht 59.0 in | Wt 192.8 lb

## 2023-10-07 DIAGNOSIS — R202 Paresthesia of skin: Secondary | ICD-10-CM | POA: Diagnosis not present

## 2023-10-07 DIAGNOSIS — Z48816 Encounter for surgical aftercare following surgery on the genitourinary system: Secondary | ICD-10-CM

## 2023-10-07 DIAGNOSIS — Z4889 Encounter for other specified surgical aftercare: Secondary | ICD-10-CM

## 2023-10-07 NOTE — Patient Instructions (Addendum)
You will have your sugar test next visit.  Please do not eat or drink anything after midnight the night before you come, not even water.  You will be here for at least two hours.  Please make an appointment online for the bloodwork at [email or web address] for 8:30am (or as close to this as possible). Make sure you select the [facility] service center. The day of the appointment, check in with our office first, then you will go to [facility] to start the sugar test.    Tips To Increase Milk Supply Lots of water! Enough so that your urine is clear Plenty of calories, if you're not getting enough calories, your milk supply can decrease Breastfeed/pump often, every 2-3 hours x 20-32mins Fenugreek 3 pills 3 times a day, this may make your urine smell like maple syrup Mother's Milk Tea Lactation cookies, google for the recipe Real oatmeal Body Armor sports drinks Liquid Gold Greater Than hydration drink

## 2023-10-07 NOTE — Progress Notes (Signed)
 GYN VISIT Patient name: Courtney Spencer MRN [id number]  Date of birth: 10-11-1995 Chief Complaint:   Routine Post Op  History of Present Illness:   Courtney Spencer is a 28 y.o. G38P1011 Caucasian female 10d s/p PCS being seen today for incision check.  Breastfeeding, adequate supply, but just enough. Numb/tingling sensation small area Lt lower forearm x 2d- IV was on other side, no trauma she knows of.  Patient's last menstrual period was 01/08/2023 (approximate).     08/20/2023   11:09 AM 04/14/2023    3:51 PM 01/14/2022    3:32 PM 06/08/2021    1:13 PM 12/29/2020    2:57 PM  Depression screen PHQ 2/9  Decreased Interest 0 1 1 0 0  Down, Depressed, Hopeless 0 0 1 0 0  PHQ - 2 Score 0 1 2 0 0  Altered sleeping  0 1 1 0  Tired, decreased energy  2 1 2  0  Change in appetite  0 0 1 0  Feeling bad or failure about yourself   0 1 0 0  Trouble concentrating  0 0 0 0  Moving slowly or fidgety/restless  0 0 0 0  Suicidal thoughts  0 0 0 0  PHQ-9 Score  3 5 4  0  Difficult doing work/chores    Not difficult at all Not difficult at all        04/14/2023    3:51 PM 01/14/2022    3:32 PM 12/10/2019    1:04 PM  GAD 7 : Generalized Anxiety Score  Nervous, Anxious, on Edge 1 1 1   Control/stop worrying 0 1 1  Worry too much - different things 0 1 0  Trouble relaxing 0 1 0  Restless 0 0 0  Easily annoyed or irritable 0 0 0  Afraid - awful might happen 0 0 2  Total GAD 7 Score 1 4 4      Review of Systems:   Pertinent items are noted in HPI Denies fever/chills, dizziness, headaches, visual disturbances, fatigue, shortness of breath, chest pain, abdominal pain, vomiting, abnormal vaginal discharge/itching/odor/irritation, problems with periods, bowel movements, urination, or intercourse unless otherwise stated above.  Pertinent History Reviewed:  Reviewed past medical,surgical, social, obstetrical and family history.  Reviewed problem list, medications and  allergies. Physical Assessment:   Vitals:   10/07/23 1159  BP: 107/73  Pulse: 97  Weight: 192 lb 12.8 oz (87.5 kg)  Height: 4' 11 (1.499 m)  Body mass index is 38.94 kg/m.       Physical Examination:   General appearance: alert, well appearing, and in no distress  Mental status: alert, oriented to person, place, and time  Skin: warm & dry   Cardiovascular: normal heart rate noted  Respiratory: normal respiratory effort, no distress  Abdomen: soft, non-tender, c/s incision healing well, no s/s infection  Pelvic: examination not indicated  Extremities: no edema, erythema, or any abnormalities noted in area of concern Lt lower forearm   Chaperone: N/A  No results found for this or any previous visit (from the past 24 hours).  Assessment & Plan:  1) 10d s/p PCS> breastfeeding-milk tips given  2) Numbness/tingling small area Lt lower forearm> no trauma, IV was on other side, sounds like nerve?... let us  know if not improving/worsening  Meds: No orders of the defined types were placed in this encounter.   No orders of the defined types were placed in this encounter.   Return for add sugar test to  pp visit if not already please.  Suzen JONELLE Fetters CNM, [facility] 10/07/2023 12:13 PM

## 2023-10-14 ENCOUNTER — Encounter: Admitting: Obstetrics & Gynecology

## 2023-10-21 ENCOUNTER — Other Ambulatory Visit: Payer: Self-pay | Admitting: Adult Health

## 2023-11-06 ENCOUNTER — Other Ambulatory Visit

## 2023-11-06 ENCOUNTER — Encounter: Payer: Self-pay | Admitting: Women's Health

## 2023-11-06 ENCOUNTER — Ambulatory Visit (INDEPENDENT_AMBULATORY_CARE_PROVIDER_SITE_OTHER): Admitting: Women's Health

## 2023-11-06 DIAGNOSIS — Z98891 History of uterine scar from previous surgery: Secondary | ICD-10-CM

## 2023-11-06 DIAGNOSIS — Z30018 Encounter for initial prescription of other contraceptives: Secondary | ICD-10-CM

## 2023-11-06 DIAGNOSIS — Z8759 Personal history of other complications of pregnancy, childbirth and the puerperium: Secondary | ICD-10-CM

## 2023-11-06 DIAGNOSIS — Z8632 Personal history of gestational diabetes: Secondary | ICD-10-CM

## 2023-11-06 MED ORDER — PHEXXI 1.8-1-0.4 % VA GEL: 1.0000 | Freq: Once | VAGINAL | 11 refills | Status: AC

## 2023-11-06 NOTE — Patient Instructions (Signed)
 Tips To Increase Milk Supply Lots of water! Enough so that your urine is clear Plenty of calories, if you're not getting enough calories, your milk supply can decrease Breastfeed/pump often, every 2-3 hours x 20-33mins Fenugreek 3 pills 3 times a day, this may make your urine smell like maple syrup Mother's Milk Tea Lactation cookies, google for the recipe Real oatmeal Body Armor sports drinks Liquid Gold Greater Than hydration drink

## 2023-11-06 NOTE — Progress Notes (Signed)
 POSTPARTUM VISIT Patient name: Courtney Spencer MRN [id number]  Date of birth: [birth year] Chief Complaint:   Postpartum Care  History of Present Illness:   Courtney Spencer is a 28 y.o. G71P1011 Caucasian female being seen today for a postpartum visit. She is 5 weeks postpartum following a primary cesarean section, low transverse incision d/t breech during IOL at 37.3 gestational weeks. IOL: yes, for diabetes mellitus A1DM, elevated dopplers , and fetal growth restriction . Anesthesia: spinal.  Laceration: n/a.  Complications: none. Inpatient contraception: no.   Pregnancy complicated by FGR w/ elevated UAD, A1DM. Tobacco use: no. Substance use disorder: no. Last pap smear: 04/14/23 and results were NILM w/ HRHPV negative. Next pap smear due: 2028 Patient's last menstrual period was 01/08/2023 (approximate).  Postpartum course has been uncomplicated. Bleeding none. Bowel function is normal. Bladder function is normal. Urinary incontinence? no, fecal incontinence? no Patient is not sexually active. Last sexual activity: prior to birth of baby. Desired contraception: Phexxi  and coitus interruptus. Patient does not know want a pregnancy in the future.  Desired family size is uncertain #of children.   [facility] - 11/06/23 0925       Pregnancy Intention Screening   Does the patient want to become pregnant in the next year? No    Does the patient's partner want to become pregnant in the next year? No    Would the patient like to discuss contraceptive options today? No      Contraception Wrap Up   Current Method Abstinence    End Method No Contraception Precautions    Contraception Counseling Provided No         The pregnancy intention screening data noted above was reviewed. Potential methods of contraception were discussed. The patient elected to proceed with No Contraception Precautions.  Edinburgh Postpartum Depression Screening: negative  Edinburgh Postnatal Depression  Scale - 11/06/23 0925       Edinburgh Postnatal Depression Scale:  In the Past 7 Days   I have been able to laugh and see the funny side of things. 0    I have looked forward with enjoyment to things. 0    I have blamed myself unnecessarily when things went wrong. 1    I have been anxious or worried for no good reason. 2    I have felt scared or panicky for no good reason. 1    Things have been getting on top of me. 0    I have been so unhappy that I have had difficulty sleeping. 0    I have felt sad or miserable. 0    I have been so unhappy that I have been crying. 0    The thought of harming myself has occurred to me. 0    Edinburgh Postnatal Depression Scale Total 4             04/14/2023    3:51 PM 01/14/2022    3:32 PM 12/10/2019    1:04 PM  GAD 7 : Generalized Anxiety Score  Nervous, Anxious, on Edge 1 1 1   Control/stop worrying 0 1 1  Worry too much - different things 0 1 0  Trouble relaxing 0 1 0  Restless 0 0 0  Easily annoyed or irritable 0 0 0  Afraid - awful might happen 0 0 2  Total GAD 7 Score 1 4 4      Baby's course has been uncomplicated. Baby is feeding by breast: milk supply adequate. Infant has a  pediatrician/family doctor? Yes.  Childcare strategy if returning to work/school: family.  Pt has material needs met for her and baby: Yes.   Review of Systems:   Pertinent items are noted in HPI Denies Abnormal vaginal discharge w/ itching/odor/irritation, headaches, visual changes, shortness of breath, chest pain, abdominal pain, severe nausea/vomiting, or problems with urination or bowel movements. Pertinent History Reviewed:  Reviewed past medical,surgical, obstetrical and family history.  Reviewed problem list, medications and allergies. OB History  Gravida Para Term Preterm AB Living  2 1 1  1 1   SAB IAB Ectopic Multiple Live Births  1   0 1    # Outcome Date GA Lbr Len/2nd Weight Sex Type Anes PTL Lv  2 Term 09/27/23 [redacted]w[redacted]d  5 lb 5.4 oz (2.42 kg) M  CS-LTranv Spinal  LIV  1 SAB 09/2022           Physical Assessment:   Vitals:   11/06/23 0924  BP: 116/75  Pulse: 76  Weight: 197 lb (89.4 kg)  Height: 4' 11 (1.499 m)  Body mass index is 39.79 kg/m.       Physical Examination:   General appearance: alert, well appearing, and in no distress  Mental status: alert, oriented to person, place, and time  Skin: warm & dry   Cardiovascular: normal heart rate noted   Respiratory: normal respiratory effort, no distress   Breasts: deferred, no complaints   Abdomen: soft, non-tender, c/s incision healing well, no s/s infection  Pelvic: examination not indicated. Thin prep pap obtained: No  Rectal: not examined  Extremities: Edema: none   Chaperone: N/A       No results found for this or any previous visit (from the past 24 hours).  Assessment & Plan:  1) Postpartum exam 2) 5 wks s/p primary cesarean section, low transverse incision d/t breech during IOL for FGR/A1DM 3) breast feeding 4) Depression screening 5) Contraception management: rx Phexxi  to [employer], discussed use, plans w/drawal as well 6) A1DM during pregnancy> GTT today  Essential components of care per ACOG recommendations:  1.  Mood and well being:  If positive depression screen, discussed and plan developed.  If using tobacco we discussed reduction/cessation and risk of relapse If current substance abuse, we discussed and referral to local resources was offered.   2. Infant care and feeding:  If breastfeeding, discussed returning to work, pumping, breastfeeding-associated pain, guidance regarding return to fertility while lactating if not using another method. If needed, patient was provided with a letter to be allowed to pump q 2-3hrs to support lactation in a private location with access to a refrigerator to store breastmilk.   Recommended that all caregivers be immunized for flu, pertussis and other preventable communicable diseases If pt does not have material  needs met for her/baby, referred to local resources for help obtaining these.  3. Sexuality, contraception and birth spacing Provided guidance regarding sexuality, management of dyspareunia, and resumption of intercourse Discussed avoiding interpregnancy interval <73mths and recommended birth spacing of 18 months  4. Sleep and fatigue Discussed coping options for fatigue and sleep disruption Encouraged family/partner/community support of 4 hrs of uninterrupted sleep to help with mood and fatigue  5. Physical recovery  If pt had a C/S, assessed incisional pain and providing guidance on normal vs prolonged recovery If pt had a laceration, perineal healing and pain reviewed.  If urinary or fecal incontinence, discussed management and referred to PT or uro/gyn if indicated  Patient is safe to resume physical  activity. Discussed attainment of healthy weight.  6.  Chronic disease management Discussed pregnancy complications if any, and their implications for future childbearing and long-term maternal health. Review recommendations for prevention of recurrent pregnancy complications, such as 17 hydroxyprogesterone caproate to reduce risk for recurrent PTB not applicable, or aspirin  to reduce risk of preeclampsia not applicable. Pt had GDM: yes. If yes, 2hr GTT scheduled: yes. Reviewed medications and non-pregnant dosing including consideration of whether pt is breastfeeding using a reliable resource such as LactMed: yes Referred for f/u w/ PCP or subspecialist providers as indicated: not applicable (no PCP)  7. Health maintenance Mammogram at 29yo or earlier if indicated Pap smears as indicated  Meds:  Meds ordered this encounter  Medications   Lactic Ac-Citric Ac-Pot Bitart (PHEXXI ) 1.8-1-0.4 % GEL    Sig: Place 1 Applicatorful vaginally once for 1 dose. Up to 1 hour before sex    Dispense:  180 g    Refill:  11    Follow-up: Return in about 1 year (around 11/05/2024) for Physical.   No  orders of the defined types were placed in this encounter.   Suzen JONELLE Fetters CNM, [facility] 11/06/2023 9:46 AM

## 2023-11-07 LAB — GLUCOSE TOLERANCE, 2 HOURS W/ 1HR
Glucose, 1 hour: 119 mg/dL (ref 70–179)
Glucose, 2 hour: 95 mg/dL (ref 70–152)
Glucose, Fasting: 92 mg/dL — ABNORMAL HIGH (ref 70–91)

## 2023-11-10 ENCOUNTER — Ambulatory Visit: Payer: Self-pay | Admitting: Women's Health

## 2023-11-10 DIAGNOSIS — Z131 Encounter for screening for diabetes mellitus: Secondary | ICD-10-CM

## 2023-11-10 DIAGNOSIS — R7302 Impaired glucose tolerance (oral): Secondary | ICD-10-CM

## 2023-11-10 DIAGNOSIS — Z8632 Personal history of gestational diabetes: Secondary | ICD-10-CM

## 2024-02-11 LAB — HEMOGLOBIN A1C
Est. average glucose Bld gHb Est-mCnc: 108 mg/dL
Hgb A1c MFr Bld: 5.4 % (ref 4.8–5.6)

## 2024-02-25 ENCOUNTER — Encounter: Payer: Self-pay | Admitting: Gastroenterology
